# Patient Record
Sex: Female | Born: 1970 | Race: White | Hispanic: No | Marital: Married | Smoking: Never smoker
Health system: Southern US, Community
[De-identification: ages and names within clinical notes are randomized; demographics above are authoritative.]

## PROBLEM LIST (undated history)

## (undated) DIAGNOSIS — G629 Polyneuropathy, unspecified: Secondary | ICD-10-CM

## (undated) DIAGNOSIS — K219 Gastro-esophageal reflux disease without esophagitis: Secondary | ICD-10-CM

## (undated) DIAGNOSIS — R51 Headache: Secondary | ICD-10-CM

## (undated) DIAGNOSIS — I10 Essential (primary) hypertension: Secondary | ICD-10-CM

## (undated) DIAGNOSIS — D649 Anemia, unspecified: Secondary | ICD-10-CM

## (undated) DIAGNOSIS — M1990 Unspecified osteoarthritis, unspecified site: Secondary | ICD-10-CM

## (undated) DIAGNOSIS — R7989 Other specified abnormal findings of blood chemistry: Secondary | ICD-10-CM

## (undated) DIAGNOSIS — F32A Depression, unspecified: Secondary | ICD-10-CM

## (undated) DIAGNOSIS — R519 Headache, unspecified: Secondary | ICD-10-CM

## (undated) DIAGNOSIS — E119 Type 2 diabetes mellitus without complications: Secondary | ICD-10-CM

## (undated) DIAGNOSIS — F329 Major depressive disorder, single episode, unspecified: Secondary | ICD-10-CM

## (undated) DIAGNOSIS — Z9289 Personal history of other medical treatment: Secondary | ICD-10-CM

## (undated) HISTORY — PX: BACK SURGERY: SHX140

## (undated) HISTORY — DX: Essential (primary) hypertension: I10

## (undated) HISTORY — PX: ABDOMINAL HYSTERECTOMY: SHX81

---

## 2006-01-09 ENCOUNTER — Emergency Department: Payer: Self-pay | Admitting: Emergency Medicine

## 2006-11-29 ENCOUNTER — Ambulatory Visit: Payer: Self-pay | Admitting: Obstetrics and Gynecology

## 2008-03-14 ENCOUNTER — Emergency Department: Payer: Self-pay | Admitting: Emergency Medicine

## 2008-04-05 ENCOUNTER — Emergency Department: Payer: Self-pay | Admitting: Emergency Medicine

## 2010-12-05 ENCOUNTER — Ambulatory Visit: Payer: Self-pay | Admitting: Family Medicine

## 2010-12-07 ENCOUNTER — Ambulatory Visit: Payer: Self-pay | Admitting: Family Medicine

## 2014-10-04 ENCOUNTER — Encounter: Payer: Self-pay | Admitting: Emergency Medicine

## 2014-10-04 ENCOUNTER — Emergency Department
Admission: EM | Admit: 2014-10-04 | Discharge: 2014-10-04 | Disposition: A | Discharge status: Home / Self Care | Payer: BLUE CROSS/BLUE SHIELD | Attending: Emergency Medicine | Admitting: Emergency Medicine

## 2014-10-04 ENCOUNTER — Emergency Department: Payer: BLUE CROSS/BLUE SHIELD

## 2014-10-04 ENCOUNTER — Other Ambulatory Visit: Payer: Self-pay

## 2014-10-04 DIAGNOSIS — Z79899 Other long term (current) drug therapy: Secondary | ICD-10-CM | POA: Insufficient documentation

## 2014-10-04 DIAGNOSIS — R1110 Vomiting, unspecified: Secondary | ICD-10-CM

## 2014-10-04 DIAGNOSIS — R42 Dizziness and giddiness: Secondary | ICD-10-CM

## 2014-10-04 DIAGNOSIS — H81399 Other peripheral vertigo, unspecified ear: Secondary | ICD-10-CM | POA: Insufficient documentation

## 2014-10-04 HISTORY — DX: Depression, unspecified: F32.A

## 2014-10-04 HISTORY — DX: Major depressive disorder, single episode, unspecified: F32.9

## 2014-10-04 LAB — BASIC METABOLIC PANEL
Anion gap: 11 (ref 5–15)
BUN: 15 mg/dL (ref 6–20)
CO2: 20 mmol/L — AB (ref 22–32)
CREATININE: 1.24 mg/dL — AB (ref 0.44–1.00)
Calcium: 9.5 mg/dL (ref 8.9–10.3)
Chloride: 108 mmol/L (ref 101–111)
GFR calc Af Amer: >60 mL/min (ref >60)
GFR, EST NON AFRICAN AMERICAN: 53 mL/min — AB (ref >60)
Glucose, Bld: 179 mg/dL — ABNORMAL HIGH (ref 65–99)
POTASSIUM: 4.3 mmol/L (ref 3.5–5.1)
Sodium: 139 mmol/L (ref 135–145)

## 2014-10-04 LAB — CBC
HCT: 41.5 % (ref 35.0–47.0)
Hemoglobin: 13.8 g/dL (ref 12.0–16.0)
MCH: 28.3 pg (ref 26.0–34.0)
MCHC: 33.3 g/dL (ref 32.0–36.0)
MCV: 84.7 fL (ref 80.0–100.0)
PLATELETS: 258 10*3/uL (ref 150–440)
RBC: 4.9 MIL/uL (ref 3.80–5.20)
RDW: 13.3 % (ref 11.5–14.5)
WBC: 14.5 10*3/uL — ABNORMAL HIGH (ref 3.6–11.0)

## 2014-10-04 MED ORDER — MECLIZINE HCL 25 MG PO TABS
ORAL_TABLET | ORAL | Status: AC
  Administered 2014-10-04: 25 mg via ORAL
  Filled 2014-10-04: qty 1

## 2014-10-04 MED ORDER — MECLIZINE HCL 12.5 MG PO TABS: 12.5000 mg | ORAL_TABLET | Freq: Three times a day (TID) | ORAL | Status: AC | PRN

## 2014-10-04 MED ORDER — DIPHENHYDRAMINE HCL 50 MG/ML IJ SOLN
12.5000 mg | Freq: Once | INTRAMUSCULAR | Status: AC
  Administered 2014-10-04: 12.5 mg via INTRAMUSCULAR

## 2014-10-04 MED ORDER — MECLIZINE HCL 25 MG PO TABS
25.0000 mg | ORAL_TABLET | Freq: Once | ORAL | Status: AC
  Administered 2014-10-04: 25 mg via ORAL

## 2014-10-04 MED ORDER — DIPHENHYDRAMINE HCL 50 MG/ML IJ SOLN
INTRAMUSCULAR | Status: AC
  Administered 2014-10-04: 12.5 mg via INTRAMUSCULAR
  Filled 2014-10-04: qty 1

## 2014-10-04 NOTE — ED Notes (Signed)
Patient presents to the ED with severe dizziness, nausea, vomiting, hot flashes, and light headedness since this morning.  Patient states symptoms are worse when she stands up.  Patient states she hit her head twice during the night on Tuesday.  Patient has two small healing lacerations to her forehead.  Patient is in no obvious distress at this time.  Denies pain.

## 2014-10-04 NOTE — ED Notes (Signed)
Pt sat up in bed with MD at bedside. Pt reports becoming dizzy and lightheaded with sitting up straight .

## 2014-10-04 NOTE — ED Provider Notes (Signed)
[facility] Emergency Department Provider Note  ____________________________________________  Time seen: 1545  I have reviewed the triage vital signs and the nursing notes.   HISTORY  Chief Complaint Dizziness  nausea   HPI Sherry Hobbs is a 44 y.o. female who woke up this morning with significant vertigo. She feels that the room is spinning. This is worsened whenever she moves or sits up or stands. She has not had symptoms like this before. She has ongoing nausea. She does report that she fell and hit her head on Tuesday. She has 2 small skin lacerations, one on her forehead and 1 further up. She denies loss of consciousness at that time. She reports it occurred because she was sleepwalking, which is something she doesn't occasion. She denies any weakness in her hands or legs. She feels like she has full strength and is able to walk except for feeling off balance.   Past Medical History  Diagnosis Date  . Depression     There are no active problems to display for this patient.   History reviewed. No pertinent past surgical history.  Current Outpatient Rx  Name  Route  Sig  Dispense  Refill  . citalopram (CELEXA) 40 MG tablet   Oral   Take 40 mg by mouth daily.         . meclizine (ANTIVERT) 12.5 MG tablet   Oral   Take 1 tablet (12.5 mg total) by mouth 3 (three) times daily as needed for dizziness or nausea.   30 tablet   1     Allergies Codeine  History reviewed. No pertinent family history.  Social History History  Substance Use Topics  . Smoking status: Never Smoker   . Smokeless tobacco: Not on file  . Alcohol Use: No    Review of Systems  Constitutional: Negative for fever. ENT: Negative for sore throat. Cardiovascular: Negative for chest pain. Respiratory: Negative for shortness of breath. Gastrointestinal: Negative for abdominal pain, vomiting and diarrhea. Genitourinary: Negative for dysuria. Musculoskeletal:  Negative for back pain. Skin: Negative for rash. Neurological: Notable for vertigo. See history of present illness.   10-point ROS otherwise negative.  ____________________________________________   PHYSICAL EXAM:  VITAL SIGNS: ED Triage Vitals  Enc Vitals Group     BP 10/04/14 1405 131/71 mmHg     Pulse Rate 10/04/14 1405 62     Resp 10/04/14 1405 16     Temp 10/04/14 1405 97.6 F (36.4 C)     Temp Source 10/04/14 1405 Oral     SpO2 10/04/14 1405 100 %     Weight 10/04/14 1405 183 lb (83.008 kg)     Height 10/04/14 1405 5\' 2"  (1.575 m)     Head Cir --      Peak Flow --      Pain Score 10/04/14 1406 0     Pain Loc --      Pain Edu? --      Excl. in [place]? --     Constitutional:  Alert and oriented. Well appearing and in no distress, but clearly limiting the amount of head movement ENT   Head: Normocephalic. There are 2 small healing scabbed over wounds from her fall. No erythema.   Nose: No congestion/rhinnorhea.   Mouth/Throat: Mucous membranes are moist.      Ears: Normal canals, normal TMs with no erythema. Cardiovascular: Normal rate, regular rhythm. Respiratory: Normal respiratory effort without tachypnea. Breath sounds are clear and equal bilaterally. No wheezes/rales/rhonchi. Gastrointestinal:  Soft and nontender. No distention.  Back: No muscle spasm, no tenderness, no CVA tenderness. Musculoskeletal: Nontender with normal range of motion in all extremities.  No noted edema. Neurologic:  Alert and communicative. She has full strength in all 4 extremities. Grips are equal. Sensation is intact. She has a negative pronator drift. She does not sway significantly while performing the pronator drift maneuver sitting up in bed. She has good finger to nose motion.  Skin:  Skin is warm, dry. No rash noted. Psychiatric: Mood and affect are normal. Speech and behavior are normal.  ____________________________________________    LABS (pertinent  positives/negatives)  White blood cell count is 71.2, metabolic panel is overall normal with potassium of 4.3.  ____________________________________________   EKG  ED ECG REPORT I, Ronni Osterberg W, the attending physician, personally viewed and interpreted this ECG.   Date: 10/04/2014  EKG Time: 1421  Rate: 57  Rhythm: Sinus bradycardia  Axis: 35 and normal.  Intervals:Normal intervals  ST&T Change: No ST changes   ____________________________________________    RADIOLOGY  CT head is normal with no acute changes.  ____________________________________________  INITIAL IMPRESSION / ASSESSMENT AND PLAN / ED COURSE  Patient with peripheral vertigo. We will treat her with meclizine by mouth. Because of the patient's nausea and her concerns for tolerating by mouth we are going to give her a small dose of diphenhydramine IM as well. She reports that she is comfortable with discharge home at this time to she can rest. We will prescribe meclizine for her.  ____________________________________________   FINAL CLINICAL IMPRESSION(S) / ED DIAGNOSES  Final diagnoses:  Peripheral vertigo, unspecified laterality  Acute onset of vertigo with vomiting and inability to stand      Ahmed Prima, MD 10/04/14 [phone]

## 2016-03-30 ENCOUNTER — Encounter: Payer: Self-pay | Admitting: Emergency Medicine

## 2016-03-30 ENCOUNTER — Observation Stay
Admission: EM | Admit: 2016-03-30 | Discharge: 2016-03-31 | Disposition: A | Discharge status: Home / Self Care | Payer: BLUE CROSS/BLUE SHIELD | Attending: Obstetrics and Gynecology | Admitting: Obstetrics and Gynecology

## 2016-03-30 ENCOUNTER — Observation Stay: Payer: BLUE CROSS/BLUE SHIELD

## 2016-03-30 DIAGNOSIS — Z823 Family history of stroke: Secondary | ICD-10-CM | POA: Diagnosis not present

## 2016-03-30 DIAGNOSIS — D649 Anemia, unspecified: Secondary | ICD-10-CM

## 2016-03-30 DIAGNOSIS — Z808 Family history of malignant neoplasm of other organs or systems: Secondary | ICD-10-CM | POA: Diagnosis not present

## 2016-03-30 DIAGNOSIS — N938 Other specified abnormal uterine and vaginal bleeding: Principal | ICD-10-CM | POA: Insufficient documentation

## 2016-03-30 DIAGNOSIS — Z825 Family history of asthma and other chronic lower respiratory diseases: Secondary | ICD-10-CM | POA: Insufficient documentation

## 2016-03-30 DIAGNOSIS — N83202 Unspecified ovarian cyst, left side: Secondary | ICD-10-CM | POA: Diagnosis not present

## 2016-03-30 DIAGNOSIS — Z8051 Family history of malignant neoplasm of kidney: Secondary | ICD-10-CM | POA: Diagnosis not present

## 2016-03-30 DIAGNOSIS — N83209 Unspecified ovarian cyst, unspecified side: Secondary | ICD-10-CM | POA: Diagnosis present

## 2016-03-30 DIAGNOSIS — N939 Abnormal uterine and vaginal bleeding, unspecified: Secondary | ICD-10-CM

## 2016-03-30 DIAGNOSIS — Z8249 Family history of ischemic heart disease and other diseases of the circulatory system: Secondary | ICD-10-CM | POA: Insufficient documentation

## 2016-03-30 DIAGNOSIS — Z885 Allergy status to narcotic agent status: Secondary | ICD-10-CM | POA: Insufficient documentation

## 2016-03-30 DIAGNOSIS — Z7984 Long term (current) use of oral hypoglycemic drugs: Secondary | ICD-10-CM | POA: Insufficient documentation

## 2016-03-30 DIAGNOSIS — Z8262 Family history of osteoporosis: Secondary | ICD-10-CM | POA: Insufficient documentation

## 2016-03-30 DIAGNOSIS — F329 Major depressive disorder, single episode, unspecified: Secondary | ICD-10-CM | POA: Diagnosis not present

## 2016-03-30 DIAGNOSIS — Z833 Family history of diabetes mellitus: Secondary | ICD-10-CM | POA: Insufficient documentation

## 2016-03-30 LAB — URINALYSIS COMPLETE WITH MICROSCOPIC (ARMC ONLY)
Bilirubin Urine: NEGATIVE
Glucose, UA: NEGATIVE mg/dL
Ketones, ur: NEGATIVE mg/dL
Leukocytes, UA: NEGATIVE
Nitrite: NEGATIVE
PH: 5 (ref 5.0–8.0)
PROTEIN: 100 mg/dL — AB
Specific Gravity, Urine: 1.014 (ref 1.005–1.030)

## 2016-03-30 LAB — RAPID HIV SCREEN (HIV 1/2 AB+AG)
HIV 1/2 Antibodies: NONREACTIVE
HIV-1 P24 Antigen - HIV24: NONREACTIVE

## 2016-03-30 LAB — ABO/RH: ABO/RH(D): O POS

## 2016-03-30 LAB — CBC
HCT: 22.8 % — ABNORMAL LOW (ref 35.0–47.0)
Hemoglobin: 7.4 g/dL — ABNORMAL LOW (ref 12.0–16.0)
MCH: 27.3 pg (ref 26.0–34.0)
MCHC: 32.4 g/dL (ref 32.0–36.0)
MCV: 84.4 fL (ref 80.0–100.0)
PLATELETS: 332 10*3/uL (ref 150–440)
RBC: 2.7 MIL/uL — ABNORMAL LOW (ref 3.80–5.20)
RDW: 13.6 % (ref 11.5–14.5)
WBC: 14.9 10*3/uL — ABNORMAL HIGH (ref 3.6–11.0)

## 2016-03-30 LAB — BASIC METABOLIC PANEL
ANION GAP: 6 (ref 5–15)
BUN: 11 mg/dL (ref 6–20)
CALCIUM: 8.7 mg/dL — AB (ref 8.9–10.3)
CO2: 25 mmol/L (ref 22–32)
Chloride: 104 mmol/L (ref 101–111)
Creatinine, Ser: 1.18 mg/dL — ABNORMAL HIGH (ref 0.44–1.00)
GFR calc Af Amer: >60 mL/min (ref >60)
GFR calc non Af Amer: 56 mL/min — ABNORMAL LOW (ref >60)
GLUCOSE: 125 mg/dL — AB (ref 65–99)
Potassium: 4 mmol/L (ref 3.5–5.1)
Sodium: 135 mmol/L (ref 135–145)

## 2016-03-30 LAB — PREPARE RBC (CROSSMATCH)

## 2016-03-30 LAB — TROPONIN I: Troponin I: <0.03 ng/mL (ref <0.03)

## 2016-03-30 LAB — POCT PREGNANCY, URINE: PREG TEST UR: NEGATIVE

## 2016-03-30 LAB — TSH: TSH: 3.717 u[IU]/mL (ref 0.350–4.500)

## 2016-03-30 MED ORDER — MAGNESIUM HYDROXIDE 400 MG/5ML PO SUSP: 30.0000 mL | Freq: Every day | ORAL | Status: DC | PRN

## 2016-03-30 MED ORDER — ESTROGENS CONJUGATED 25 MG IJ SOLR
25.0000 mg | Freq: Once | INTRAMUSCULAR | Status: AC
  Administered 2016-03-31: 25 mg via INTRAVENOUS
  Filled 2016-03-30: qty 25

## 2016-03-30 MED ORDER — SODIUM CHLORIDE 0.9 % IV SOLN
Freq: Once | INTRAVENOUS | Status: AC
  Administered 2016-03-30: via INTRAVENOUS

## 2016-03-30 MED ORDER — ACETAMINOPHEN 500 MG PO TABS
1000.0000 mg | ORAL_TABLET | Freq: Once | ORAL | Status: AC
  Administered 2016-03-31: 1000 mg via ORAL
  Filled 2016-03-30: qty 2

## 2016-03-30 MED ORDER — DIPHENHYDRAMINE HCL 25 MG PO CAPS
25.0000 mg | ORAL_CAPSULE | Freq: Once | ORAL | Status: AC
  Administered 2016-03-31: 25 mg via ORAL
  Filled 2016-03-30: qty 1

## 2016-03-30 MED ORDER — IBUPROFEN 600 MG PO TABS: 600.0000 mg | ORAL_TABLET | Freq: Four times a day (QID) | ORAL | Status: DC | PRN

## 2016-03-30 MED ORDER — ONDANSETRON HCL 4 MG PO TABS
4.0000 mg | ORAL_TABLET | Freq: Four times a day (QID) | ORAL | Status: DC | PRN
  Administered 2016-03-31: 4 mg via ORAL
  Filled 2016-03-30: qty 1

## 2016-03-30 MED ORDER — ZOLPIDEM TARTRATE 5 MG PO TABS: 5.0000 mg | ORAL_TABLET | Freq: Every evening | ORAL | Status: DC | PRN

## 2016-03-30 MED ORDER — ALUM & MAG HYDROXIDE-SIMETH 200-200-20 MG/5ML PO SUSP: 30.0000 mL | ORAL | Status: DC | PRN

## 2016-03-30 MED ORDER — LACTATED RINGERS IV SOLN: INTRAVENOUS | Status: DC

## 2016-03-30 MED ORDER — FUROSEMIDE 10 MG/ML IJ SOLN
20.0000 mg | Freq: Once | INTRAMUSCULAR | Status: AC
  Administered 2016-03-31: 20 mg via INTRAVENOUS
  Filled 2016-03-30 (×2): qty 2

## 2016-03-30 MED ORDER — MAGNESIUM CITRATE PO SOLN
1.0000 | Freq: Once | ORAL | Status: DC | PRN
  Filled 2016-03-30: qty 296

## 2016-03-30 MED ORDER — DOCUSATE SODIUM 100 MG PO CAPS
100.0000 mg | ORAL_CAPSULE | Freq: Two times a day (BID) | ORAL | Status: DC
  Administered 2016-03-31: 100 mg via ORAL
  Filled 2016-03-30: qty 1

## 2016-03-30 MED ORDER — CITALOPRAM HYDROBROMIDE 20 MG PO TABS
40.0000 mg | ORAL_TABLET | Freq: Every day | ORAL | Status: DC
  Administered 2016-03-31: 40 mg via ORAL
  Filled 2016-03-30: qty 2

## 2016-03-30 MED ORDER — SODIUM CHLORIDE 0.9 % IV SOLN: 10.0000 mL/h | Freq: Once | INTRAVENOUS | Status: DC

## 2016-03-30 MED ORDER — ESTROGENS CONJUGATED 25 MG IJ SOLR
25.0000 mg | Freq: Four times a day (QID) | INTRAMUSCULAR | Status: DC
  Administered 2016-03-31 (×2): 25 mg via INTRAVENOUS
  Filled 2016-03-30 (×4): qty 25

## 2016-03-30 MED ORDER — BISACODYL 5 MG PO TBEC: 5.0000 mg | DELAYED_RELEASE_TABLET | Freq: Every day | ORAL | Status: DC | PRN

## 2016-03-30 MED ORDER — ONDANSETRON HCL 4 MG/2ML IJ SOLN: 4.0000 mg | Freq: Four times a day (QID) | INTRAMUSCULAR | Status: DC | PRN

## 2016-03-30 NOTE — ED Triage Notes (Signed)
Pt with shortness of breath started yesterday, denies any chest pain. Pt states she is currently on a heavy period.

## 2016-03-30 NOTE — ED Provider Notes (Signed)
[facility] Emergency Department Provider Note  ____________________________________________  Time seen: Approximately 8:09 PM  I have reviewed the triage vital signs and the nursing notes.   HISTORY  Chief Complaint Shortness of Breath   HPI Sherry Hobbs is a 45 y.o. female who presents for evaluation of dyspnea on exertion, palpitations, and dizziness. Patient reports that she had been on Depo for 16 years. She came off of it in July and had heavy vaginal bleeding from July to October. She saw her primary care doctor who put her on OCPs. She finished a 10 day course on Monday. Her bleeding had stopped however it started again on Monday. She reports that she's been bleeding very heavily and passing multiple blood clots since then. Today she started having dyspnea on exertion and felt like she was going to pass out she was going up the stairs. Also endorses palpitations. She denies chest pain or shortness of breath. She has never received a blood transfusion before. She denies abdominal pain.  Past Medical History:  Diagnosis Date  . Depression     There are no active problems to display for this patient.   History reviewed. No pertinent surgical history.  Prior to Admission medications   Medication Sig Start Date End Date Taking? Authorizing Provider  citalopram (CELEXA) 40 MG tablet Take 40 mg by mouth daily.    Historical Provider, MD  fluticasone (FLONASE) 50 MCG/ACT nasal spray Place 1 spray into both nostrils daily. 08/24/14   Historical Provider, MD  medroxyPROGESTERone (DEPO-PROVERA) 150 MG/ML injection Inject 150 mg into the muscle every 3 (three) months. 08/24/14   Historical Provider, MD    Allergies Codeine  No family history on file.  Social History Social History  Substance Use Topics  . Smoking status: Never Smoker  . Smokeless tobacco: Never Used  . Alcohol use No    Review of Systems  Constitutional: Negative for fever. +  lightheadedness Eyes: Negative for visual changes. ENT: Negative for sore throat. Cardiovascular: Negative for chest pain. + palpitations Respiratory: + DOE. Gastrointestinal: Negative for abdominal pain, vomiting or diarrhea. Genitourinary: Negative for dysuria. + vaginal bleeding Musculoskeletal: Negative for back pain. Skin: Negative for rash. Neurological: Negative for headaches, weakness or numbness.  ____________________________________________   PHYSICAL EXAM:  VITAL SIGNS: ED Triage Vitals  Enc Vitals Group     BP 03/30/16 1820 (!) 143/76     Pulse Rate 03/30/16 1820 100     Resp 03/30/16 1820 18     Temp 03/30/16 1820 98.5 F (36.9 C)     Temp Source 03/30/16 1820 Oral     SpO2 03/30/16 1820 98 %     Weight 03/30/16 1821 194 lb (88 kg)     Height 03/30/16 1821 5\' 2"  (1.575 m)     Head Circumference --      Peak Flow --      Pain Score 03/30/16 1841 2     Pain Loc --      Pain Edu? --      Excl. in [place]? --     Constitutional: Alert and oriented, pale. Well appearing and in no apparent distress. HEENT:      Head: Normocephalic and atraumatic.         Eyes: Conjunctivae are normal. Sclera is non-icteric. EOMI. PERRL      Mouth/Throat: Mucous membranes are moist.       Neck: Supple with no signs of meningismus. Cardiovascular: Tachycardic with regular rhythm. No murmurs,  gallops, or rubs. 2+ symmetrical distal pulses are present in all extremities. No JVD. Respiratory: Normal respiratory effort. Lungs are clear to auscultation bilaterally. No wheezes, crackles, or rhonchi.  Gastrointestinal: Soft, non tender, and non distended with positive bowel sounds. No rebound or guarding. Musculoskeletal: Nontender with normal range of motion in all extremities. No edema, cyanosis, or erythema of extremities. Neurologic: Normal speech and language. Face is symmetric. Moving all extremities. No gross focal neurologic deficits are appreciated. Skin: Skin is warm, dry and intact.  No rash noted. Psychiatric: Mood and affect are normal. Speech and behavior are normal.  ____________________________________________   LABS (all labs ordered are listed, but only abnormal results are displayed)  Labs Reviewed  CBC - Abnormal; Notable for the following:       Result Value   WBC 14.9 (*)    RBC 2.70 (*)    Hemoglobin 7.4 (*)    HCT 22.8 (*)    All other components within normal limits  BASIC METABOLIC PANEL - Abnormal; Notable for the following:    Glucose, Bld 125 (*)    Creatinine, Ser 1.18 (*)    Calcium 8.7 (*)    GFR calc non Af Amer 56 (*)    All other components within normal limits  URINALYSIS COMPLETEWITH MICROSCOPIC ([facility] ONLY) - Abnormal; Notable for the following:    Color, Urine RED (*)    APPearance HAZY (*)    Hgb urine dipstick 3+ (*)    Protein, ur 100 (*)    Bacteria, UA FEW (*)    Squamous Epithelial / LPF 0-5 (*)    All other components within normal limits  TROPONIN I  POCT PREGNANCY, URINE  TYPE AND SCREEN  PREPARE RBC (CROSSMATCH)   ____________________________________________  EKG  ED ECG REPORT I, Rudene Re, the attending physician, personally viewed and interpreted this ECG.  Normal sinus rhythm, rate of 99, normal intervals, normal axis, no ST elevations or depressions. ____________________________________________  RADIOLOGY  none  ____________________________________________   PROCEDURES  Procedure(s) performed: None Procedures Critical Care performed:  None ____________________________________________   INITIAL IMPRESSION / ASSESSMENT AND PLAN / ED COURSE   45 y.o. female who presents for evaluation of dyspnea on exertion, palpitations, and dizziness in the setting of heavy prolonged vaginal bleeding. Patient's heart rate is 100 with normal blood pressure. Her physical exam shows a pale patient in no apparent distress with no other acute findings. Her hemoglobin is 7.4. Remaining of her labs and EKG are  within normal limits. I spoke with Dr. Marcelline Mates, ObGYN who accepted admission to the hospital.  Clinical Course     Pertinent labs & imaging results that were available during my care of the patient were reviewed by me and considered in my medical decision making (see chart for details).    ____________________________________________   FINAL CLINICAL IMPRESSION(S) / ED DIAGNOSES  Final diagnoses:  Vaginal bleeding  Anemia, unspecified type      NEW MEDICATIONS STARTED DURING THIS VISIT:  New Prescriptions   No medications on file     Note:  This document was prepared using Dragon voice recognition software and may include unintentional dictation errors.    Rudene Re, MD 03/30/16 2017

## 2016-03-30 NOTE — H&P (Signed)
Reason for Consult: Symptomatic anemia Referring Physician: Rudene Re, MD (ER Physician) birth  Sherry Hobbs is an 45 y.o. G1P1 female with last menstrual period 03/20/2016,  who was admitted from the Emergency Room for symptomatic anemia. Patient presented with complaints of dyspnea on exertion, palpitations, and dizziness. Patient notes that she had previously been on Depo-Provera injections for approximately 16 years. She discontinued the Depo-Provera in July and had an episode of heavy vaginal bleeding from July to October. Patient was placed on Provera for 10 days. Notes that the bleeding had stopped however it restarted again past Monday. Patient has been bleeding heavily and passing multiple blood clots. Patient currently denies chest pain shortness of breath.This first episode of heavy vaginal bleeding that she is experienced.   Pertinent Gynecological History: Menarche age: 89 Menses: Patient has not had a period in 16 years due to Depo-Provera injections, has been bleeding since July after coming off of the Depo-Provera. Bleeding: dysfunctional uterine bleeding Contraception: none.  Notes that she and husband were thinking about conceiving again. DES exposure: denies Blood transfusions: none Sexually transmitted diseases: no past history Previous GYN Procedures: None  Last mammogram: normal Date: 2 years ago Last pap: normal Date: ~ 3 weeks ago OB History: SVD x 1    Past Medical History:  Diagnosis Date  . Depression     History reviewed. No pertinent surgical history.  Family History  Problem Relation Age of Onset  . Diabetes Mother   . COPD Mother   . Emphysema Mother   . Osteoporosis Mother   . CAD Father   . Diabetes Sister   . Diabetes Brother   . CAD Brother   . Renal cancer Maternal Uncle     1 uncle  . Brain cancer Maternal Uncle     different uncle  . Diabetes Maternal Grandmother   . Stroke Maternal Grandmother   . Bladder Cancer Maternal  Grandfather      Social History   Social History  . Marital status: Married    Spouse name: N/A  . Number of children: N/A  . Years of education: N/A   Occupational History  . Not on file.   Social History Main Topics  . Smoking status: Never Smoker  . Smokeless tobacco: Never Used  . Alcohol use No  . Drug use: Unknown  . Sexual activity: Not on file   Other Topics Concern  . Not on file   Social History Narrative  . No narrative on file    Allergies:  Allergies  Allergen Reactions  . Codeine Nausea And Vomiting    Medications:  Prior to Admission:  Prescriptions Prior to Admission  Medication Sig Dispense Refill Last Dose  . citalopram (CELEXA) 40 MG tablet Take 40 mg by mouth daily.   03/30/2016 at 0900  . ferrous sulfate 325 (65 FE) MG tablet Take 650 mg by mouth daily with breakfast.   03/30/2016 at 0900  . loratadine (CLARITIN) 10 MG tablet Take 10 mg by mouth daily.   03/30/2016 at 0900  . metFORMIN (GLUCOPHAGE) 500 MG tablet Take 2 tablets by mouth daily.  10 03/29/2016 at 2130    Review of Systems  Constitutional: Negative for chills and fever.  HENT: Negative for nosebleeds, sore throat and tinnitus.   Eyes: Negative for blurred vision, double vision and discharge.  Respiratory: Positive for shortness of breath. Negative for cough and wheezing.   Cardiovascular: Positive for palpitations. Negative for chest pain and leg swelling.  Gastrointestinal:  Negative for abdominal pain, heartburn, nausea and vomiting.  Genitourinary: Negative for dysuria, frequency, hematuria and urgency.  Musculoskeletal: Negative for back pain, joint pain, myalgias and neck pain.  Skin: Negative for itching and rash.  Neurological: Positive for dizziness. Negative for speech change, loss of consciousness, weakness and headaches.  Endo/Heme/Allergies: Positive for environmental allergies. Negative for polydipsia. Does not bruise/bleed easily.  Psychiatric/Behavioral: Negative  for depression, substance abuse and suicidal ideas.    Blood pressure (!) 143/76, pulse 100, temperature 98.5 F (36.9 C), temperature source Oral, resp. rate 18, height '5\' 2"'  (1.575 m), weight 194 lb (88 kg), last menstrual period 03/20/2016, SpO2 98 %. Physical Exam  Constitutional: She is oriented to person, place, and time. She appears well-developed and well-nourished.  HENT:  Head: Normocephalic and atraumatic.  Right Ear: External ear normal.  Left Ear: External ear normal.  Mouth/Throat: Oropharynx is clear and moist.  Eyes: Conjunctivae and EOM are normal. Pupils are equal, round, and reactive to light. No scleral icterus.  Neck: Normal range of motion. Neck supple. No JVD present. No tracheal deviation present. No thyromegaly present.  Cardiovascular: Regular rhythm and normal heart sounds.  Exam reveals no friction rub.   No murmur heard. Borderline tachycardia  GI: Soft. Bowel sounds are normal. She exhibits no distension and no mass. There is no tenderness. There is no rebound.  Genitourinary: Vagina normal and uterus normal. Rectal exam shows guaiac negative stool.  Genitourinary Comments: Dark red blood in vaginal vault  Musculoskeletal: Normal range of motion. She exhibits no edema or deformity.  Lymphadenopathy:    She has no cervical adenopathy.  Neurological: She is alert and oriented to person, place, and time.  Skin: Skin is warm and dry. She is not diaphoretic.  Psychiatric: She has a normal mood and affect. Her behavior is normal. Thought content normal.    Results for orders placed or performed during the hospital encounter of 03/30/16 (from the past 48 hour(s))  Pregnancy, urine POC     Status: None   Collection Time: 03/30/16  7:25 PM  Result Value Ref Range   Preg Test, Ur NEGATIVE NEGATIVE    Comment:        THE SENSITIVITY OF THIS METHODOLOGY IS >24 mIU/mL   CBC     Status: Abnormal   Collection Time: 03/30/16  7:26 PM  Result Value Ref Range   WBC  14.9 (H) 3.6 - 11.0 K/uL   RBC 2.70 (L) 3.80 - 5.20 MIL/uL   Hemoglobin 7.4 (L) 12.0 - 16.0 g/dL   HCT 22.8 (L) 35.0 - 47.0 %   MCV 84.4 80.0 - 100.0 fL   MCH 27.3 26.0 - 34.0 pg   MCHC 32.4 32.0 - 36.0 g/dL   RDW 13.6 11.5 - 14.5 %   Platelets 332 150 - 440 K/uL  Basic metabolic panel     Status: Abnormal   Collection Time: 03/30/16  7:26 PM  Result Value Ref Range   Sodium 135 135 - 145 mmol/L   Potassium 4.0 3.5 - 5.1 mmol/L   Chloride 104 101 - 111 mmol/L   CO2 25 22 - 32 mmol/L   Glucose, Bld 125 (H) 65 - 99 mg/dL   BUN 11 6 - 20 mg/dL   Creatinine, Ser 1.18 (H) 0.44 - 1.00 mg/dL   Calcium 8.7 (L) 8.9 - 10.3 mg/dL   GFR calc non Af Amer 56 (L) >60 mL/min   GFR calc Af Amer >60 >60 mL/min  Comment: (NOTE) The eGFR has been calculated using the CKD EPI equation. This calculation has not been validated in all clinical situations. eGFR's persistently <60 mL/min signify possible Chronic Kidney Disease.    Anion gap 6 5 - 15  Urinalysis complete, with microscopic ([facility] only)     Status: Abnormal   Collection Time: 03/30/16  7:26 PM  Result Value Ref Range   Color, Urine RED (A) YELLOW   APPearance HAZY (A) CLEAR   Glucose, UA NEGATIVE NEGATIVE mg/dL   Bilirubin Urine NEGATIVE NEGATIVE   Ketones, ur NEGATIVE NEGATIVE mg/dL   Specific Gravity, Urine 1.014 1.005 - 1.030   Hgb urine dipstick 3+ (A) NEGATIVE   pH 5.0 5.0 - 8.0   Protein, ur 100 (A) NEGATIVE mg/dL   Nitrite NEGATIVE NEGATIVE   Leukocytes, UA NEGATIVE NEGATIVE   RBC / HPF TOO NUMEROUS TO COUNT 0 - 5 RBC/hpf   WBC, UA TOO NUMEROUS TO COUNT 0 - 5 WBC/hpf   Bacteria, UA FEW (A) NONE SEEN   Squamous Epithelial / LPF 0-5 (A) NONE SEEN  Troponin I     Status: None   Collection Time: 03/30/16  7:26 PM  Result Value Ref Range   Troponin I <0.03 <0.03 ng/mL  Type and screen     Status: None (Preliminary result)   Collection Time: 03/30/16  8:05 PM  Result Value Ref Range   ABO/RH(D) PENDING    Antibody Screen  PENDING    Sample Expiration 04/02/2016     No results found.  Assessment/Plan: 1. Symptomatic anemia - secondary to dysfunctional uterine bleeding.  Will admit patient for blood transfusion. Two units PRBCS ordered, will transfuse 1 unit and repeat CBC.  Can transfuse second unit if needed. 2. Dysfunctional uterine bleeding - patient with long-term Depo-Provera use, with recent discontinuation. Was placed on Provera tablets and recently completed therapy. Notes that bleeding stopped while on the tablet however resumed shortly after completion of therapy. Will place patient on high-dose estrogen therapy 25 mg IV 24 hours to help with bleeding.  Will order pelvic ultrasound to assess for any possible structural causes of patient's abnormal bleeding.  TSH an STD panel ordered.   [name], MD [facility] 03/30/2016

## 2016-03-31 ENCOUNTER — Encounter: Payer: Self-pay | Admitting: Obstetrics and Gynecology

## 2016-03-31 DIAGNOSIS — N83209 Unspecified ovarian cyst, unspecified side: Secondary | ICD-10-CM | POA: Diagnosis present

## 2016-03-31 DIAGNOSIS — N938 Other specified abnormal uterine and vaginal bleeding: Secondary | ICD-10-CM | POA: Diagnosis present

## 2016-03-31 LAB — HEMOGLOBIN AND HEMATOCRIT, BLOOD
HCT: 22.6 % — ABNORMAL LOW (ref 35.0–47.0)
HEMATOCRIT: 26.7 % — AB (ref 35.0–47.0)
HEMOGLOBIN: 8.8 g/dL — AB (ref 12.0–16.0)
Hemoglobin: 7.6 g/dL — ABNORMAL LOW (ref 12.0–16.0)

## 2016-03-31 MED ORDER — NORETHINDRONE ACETATE 5 MG PO TABS: 5.0000 mg | ORAL_TABLET | Freq: Every day | ORAL | 2 refills | Status: DC

## 2016-03-31 MED ORDER — METFORMIN HCL 500 MG PO TABS: 1000.0000 mg | ORAL_TABLET | Freq: Every day | ORAL | Status: DC

## 2016-03-31 MED ORDER — DOCUSATE SODIUM 100 MG PO CAPS: 100.0000 mg | ORAL_CAPSULE | Freq: Every day | ORAL | 2 refills | Status: DC | PRN

## 2016-03-31 MED ORDER — FERROUS SULFATE 325 (65 FE) MG PO TABS: 325.0000 mg | ORAL_TABLET | Freq: Three times a day (TID) | ORAL | 2 refills | Status: DC

## 2016-03-31 NOTE — Progress Notes (Signed)
MAR states to reconstitute Premarin with sterile water for injection. Per Pharmacist Matt, Premarin can be reconstituted with Normal Saline instead.  Jonne Ply, RN

## 2016-03-31 NOTE — Progress Notes (Signed)
RN called Dr Marcelline Mates after [facility] were back.  Dr Marcelline Mates made aware of results and gave orders to discharge patient.

## 2016-03-31 NOTE — H&P (Deleted)
  Subjective: Patient denies any dizziness or palpitations currently.  Has ambulated some to the restroom and denies any shortness of breath.  Patient notes that her periods are about the same in flow. Has only changed 1 pad this morning.  Objective: I have reviewed patient's vital signs, intake and output, medications, labs and radiology results. Vitals:   03/31/16 0825 03/31/16 1012  BP: 128/65 122/70  Pulse: 78 82  Resp: 20 20  Temp: 97.7 F (36.5 C) 97.6 F (36.4 C)    General: alert and no distress Resp: clear to auscultation bilaterally Cardio: regular rate and rhythm, S1, S2 normal, no murmur, click, rub or gallop GI: soft, non-tender; bowel sounds normal; no masses,  no organomegaly Extremities: extremities normal, atraumatic, no cyanosis or edema Vaginal Bleeding: moderate    Labs:  CBC Latest Ref Rng & Units 03/31/2016 03/30/2016 10/04/2014  WBC 3.6 - 11.0 K/uL - 14.9(H) 14.5(H)  Hemoglobin 12.0 - 16.0 g/dL 7.6(L) 7.4(L) 13.8  Hematocrit 35.0 - 47.0 % 22.6(L) 22.8(L) 41.5  Platelets 150 - 440 K/uL - 332 258    Results for orders placed or performed during the hospital encounter of 03/30/16  TSH  Result Value Ref Range   TSH 3.717 0.350 - 4.500 uIU/mL  Rapid HIV screen (HIV 1/2 Ab+Ag)  Result Value Ref Range   HIV-1 P24 Antigen - HIV24 NON REACTIVE NON REACTIVE   HIV 1/2 Antibodies NON REACTIVE NON REACTIVE   Interpretation (HIV Ag Ab)      A non reactive test result means that HIV 1 or HIV 2 antibodies and HIV 1 p24 antigen were not detected in the specimen.    Assessment/Plan: 1. Symptomatic anemia - patient has received transfusion of 1 unit of PRBCs, with no change in H/H.  We'll proceed with transfusion of second unit.  Initial labs or likely lagging behind patient's actual blood loss.  We'll need to continue iron therapy outpatient 2. Dysfunctional uterine bleeding - patient has received approximately 2 doses of IV estrogen. Will continue treatment every 6  hours.  We'll then transition patient to a dose of combined OCPs 1 month.  Patient was previously on Depo-Provera for approximately 16 years. She will need a DEXA scan outpatient to assess bone density.  3. On further discussion with patient regarding her previous management, it was discovered the patient had actually [facility] ago and was placed on the Provera tablets. Notified [facility] of patient's admission, I will continue to manage patient while inpatient however patient will follow up as outpatient in their clinic.   Patient notes that she and husband would like to try to conceive, would like to discuss her options as she is not currently interested in surgical management. Patient can discuss further at time of her outpatient visit.  Dispo: Patient will be able to discharge home later today once blood transfusions completed, and showing appropriate upward trend, and is no longer symptomatic from her anemia.   LOS: 0 days    [name] 03/31/2016, 10:28 AM

## 2016-03-31 NOTE — Progress Notes (Signed)
All discharge instructions given to patient and she voices understanding of all instructions given.  She will make her own f/u appt with Dr Marcelline Mates in 2 wks. Prescriptions sent to [facility]. Patient discharged home with spouse escorted out by RN.

## 2016-03-31 NOTE — Discharge Instructions (Signed)
Menorrhagia Menorrhagia is when your menstrual periods are heavy or last longer than usual. Follow these instructions at home:  Only take medicine as told by your doctor.  Take any iron pills as told by your doctor. Heavy bleeding may cause low levels of iron in your body.  Do not take aspirin 1 week before or during your period. Aspirin can make the bleeding worse.  Lie down for a while if you change your tampon or pad more than once in 2 hours. This may help lessen the bleeding.  Eat a healthy diet and foods with iron. These foods include leafy green vegetables, meat, liver, eggs, and whole grain breads and cereals.  Do not try to lose weight. Wait until the heavy bleeding has stopped and your iron level is normal. Contact a doctor if:  You soak through a pad or tampon every 1 or 2 hours, and this happens every time you have a period.  You need to use pads and tampons at the same time because you are bleeding so much.  You need to change your pad or tampon during the night.  You have a period that lasts for more than 8 days.  You pass clots bigger than 1 inch (2.5 cm) wide.  You have irregular periods that happen more or less often than once a month.  You feel dizzy or pass out (faint).  You feel very weak or tired.  You feel short of breath or feel your heart is beating too fast when you exercise.  You feel sick to your stomach (nausea) and you throw up (vomit) while you are taking your medicine.  You have watery poop (diarrhea) while you are taking your medicine.  You have any problems that may be related to the medicine you are taking. Get help right away if:  You soak through 4 or more pads or tampons in 2 hours.  You have any bleeding while you are pregnant. This information is not intended to replace advice given to you by your health care provider. Make sure you discuss any questions you have with your health care provider. Document Released: 02/08/2008 Document  Revised: 10/07/2015 Document Reviewed: 10/31/2012 Elsevier Interactive Patient Education  2017 [employer] Inc.  

## 2016-03-31 NOTE — Progress Notes (Addendum)
Subjective: Patient denies any dizziness or palpitations currently.  Has ambulated some to the restroom and denies any shortness of breath.  Patient notes that her periods are about the same in flow. Has only changed 1 pad this morning.  Objective: I have reviewed patient's vital signs, intake and output, medications, labs and radiology results. Vitals:   03/31/16 0825 03/31/16 1012  BP: 128/65 122/70  Pulse: 78 82  Resp: 20 20  Temp: 97.7 F (36.5 C) 97.6 F (36.4 C)    General: alert and no distress Resp: clear to auscultation bilaterally Cardio: regular rate and rhythm, S1, S2 normal, no murmur, click, rub or gallop GI: soft, non-tender; bowel sounds normal; no masses,  no organomegaly Extremities: extremities normal, atraumatic, no cyanosis or edema Vaginal Bleeding: moderate    Labs:  CBC Latest Ref Rng & Units 03/31/2016 03/30/2016 10/04/2014  WBC 3.6 - 11.0 K/uL - 14.9(H) 14.5(H)  Hemoglobin 12.0 - 16.0 g/dL 7.6(L) 7.4(L) 13.8  Hematocrit 35.0 - 47.0 % 22.6(L) 22.8(L) 41.5  Platelets 150 - 440 K/uL - 332 258    Results for orders placed or performed during the hospital encounter of 03/30/16  TSH  Result Value Ref Range   TSH 3.717 0.350 - 4.500 uIU/mL  Rapid HIV screen (HIV 1/2 Ab+Ag)  Result Value Ref Range   HIV-1 P24 Antigen - HIV24 NON REACTIVE NON REACTIVE   HIV 1/2 Antibodies NON REACTIVE NON REACTIVE   Interpretation (HIV Ag Ab)      A non reactive test result means that HIV 1 or HIV 2 antibodies and HIV 1 p24 antigen were not detected in the specimen.    Imaging (Pelvic Ultrasound 03/30/2016):  CLINICAL DATA:  Abnormal uterine bleeding for 6 months.  EXAM: TRANSABDOMINAL AND TRANSVAGINAL ULTRASOUND OF PELVIS  TECHNIQUE: Both transabdominal and transvaginal ultrasound examinations of the pelvis were performed. Transabdominal technique was performed for global imaging of the pelvis including uterus, ovaries, adnexal regions, and pelvic cul-de-sac. It  was necessary to proceed with endovaginal exam following the transabdominal exam to visualize the endometrium.  COMPARISON:  None  FINDINGS: Uterus  Measurements: 9.3 x 5.5 x 6.3 cm. No fibroids or other mass visualized.  Endometrium  Thickness: 17 mm. Thickened without discrete mass. Mildly hypervascular endometrium.  Right ovary  Measurements: 4 x 2.5 x 3.4 cm. Multiple dominant follicles measuring up to 2.2 cm.  Left ovary  Measurements: 5.7 x 2.6 x 3.4 cm. Anechoic 5.1 cm simple cyst.Otherwise normal in appearance.  Other findings  No abnormal free fluid.  IMPRESSION: Abnormally thickened endometrium. If bleeding remains unresponsive to hormonal or medical therapy, focal lesion work-up with sonohysterogram should be considered. Endometrial biopsy should also be considered in pre-menopausal patients at high risk for endometrial carcinoma. (Ref: Radiological Reasoning: Algorithmic Workup of Abnormal Vaginal Bleeding with Endovaginal Sonography and Sonohysterography. AJR 2008; [id number])  5.1 cm LEFT ovarian cyst. This is almost certainly benign, but follow up ultrasound is recommended in 1 year according to the Society of Radiologists in Ultrasound 2010 Consensus Conference Statement (D Clovis Riley et al. Management of Asymptomatic Ovarian and Other Adnexal Cysts Imaged at [place]: Society of Radiologists in [place] Statement 2010. Radiology 256 (Sept 2010): 943-954.).    Assessment/Plan: 1. Symptomatic anemia - patient has received transfusion of 1 unit of PRBCs, with no change in H/H.  We'll proceed with transfusion of second unit.  Initial labs or likely lagging behind patient's actual blood loss.  We'll need to continue iron therapy outpatient 2. Dysfunctional  uterine bleeding - patient has received approximately 2 doses of IV estrogen. Will continue treatment every 6 hours.  We'll then transition patient to a dose of combined OCPs 1 month.   Patient was previously on Depo-Provera for approximately 16 years. She will need a DEXA scan outpatient to assess bone density.  Ultrasound does not note any structural causes for her dysfunctional bleeding. Discuss ultrasound results with patient. 3. On further discussion with patient regarding her previous management, it was discovered the patient had actually [facility] weeks ago and was placed on the Provera tablets. Notified [facility] of patient's admission, I will continue to manage patient while inpatient however patient will follow up as outpatient in their clinic.   Patient notes that she and husband would like to try to conceive, would like to discuss her options as she is not currently interested in surgical management. Patient can discuss further at time of her outpatient visit. 4. Left adnexal cyst - currently asymptomatic. Is approximately 5.1 cm. Likely benign based on appearance on ultrasound. Will likely resolve spontaneously, or with OCP treatment. We'll need follow-up in 6-8 weeks to reassess ovarian cyst size.   Dispo: Patient will be able to discharge home later today once blood transfusions completed, and showing appropriate upward trend, and is no longer symptomatic from her anemia.   LOS: 0 days    Sherry Hobbs 03/31/2016, 10:38 AM

## 2016-04-01 LAB — TYPE AND SCREEN
ABO/RH(D): O POS
ANTIBODY SCREEN: NEGATIVE
UNIT DIVISION: 0
Unit division: 0

## 2016-04-01 LAB — HEPATITIS B SURFACE ANTIGEN: HEP B S AG: NEGATIVE

## 2016-04-01 LAB — HEPATITIS C ANTIBODY

## 2016-04-01 LAB — RPR: RPR Ser Ql: NONREACTIVE

## 2016-04-01 NOTE — Discharge Summary (Signed)
Physician Discharge Summary   Patient ID: Sherry Hobbs [id number] y.o. October 31, 1970  Admit date: 03/30/2016  Discharge date and time: 03/31/2016  5:15 PM   Admitting Physician: [name], MD   Discharge Physician: [name], MD  Admission Diagnoses: Vaginal bleeding [N93.9] Abnormal uterine bleeding [N93.9] Anemia, unspecified type [D64.9]  Discharge Diagnoses: Same  Admission Condition: fair  Discharged Condition: good  Indication for Admission: Symptomatic anemia, abnormal uterine bleeding  Hospital Course: The patient was admitted from the Emergency Room for complaints of dizziness, lightheadedness, dyspnea on exertion and abnormal vaginal bleeding. Her Hgb was noted to be 7.4. The patient was transfused 2 units of PRBCs, with an increase to 8.8.  She was also treated with IV conjugated estrogen 25 mg q 6 hours. Her symptoms resolved by the next day, and she was discharged home.   Consults: None  Significant Diagnostic Studies: labs and radiology: Ultrasound: see below   Results for orders placed or performed during the hospital encounter of 03/30/16  CBC  Result Value Ref Range   WBC 14.9 (H) 3.6 - 11.0 K/uL   RBC 2.70 (L) 3.80 - 5.20 MIL/uL   Hemoglobin 7.4 (L) 12.0 - 16.0 g/dL   HCT 22.8 (L) 35.0 - 47.0 %   MCV 84.4 80.0 - 100.0 fL   MCH 27.3 26.0 - 34.0 pg   MCHC 32.4 32.0 - 36.0 g/dL   RDW 13.6 11.5 - 14.5 %   Platelets 332 150 - 440 K/uL  Basic metabolic panel  Result Value Ref Range   Sodium 135 135 - 145 mmol/L   Potassium 4.0 3.5 - 5.1 mmol/L   Chloride 104 101 - 111 mmol/L   CO2 25 22 - 32 mmol/L   Glucose, Bld 125 (H) 65 - 99 mg/dL   BUN 11 6 - 20 mg/dL   Creatinine, Ser 1.18 (H) 0.44 - 1.00 mg/dL   Calcium 8.7 (L) 8.9 - 10.3 mg/dL   GFR calc non Af Amer 56 (L) >60 mL/min   GFR calc Af Amer >60 >60 mL/min   Anion gap 6 5 - 15  Urinalysis complete, with microscopic ([facility] only)  Result Value Ref Range   Color, Urine RED (A) YELLOW   APPearance HAZY (A) CLEAR   Glucose, UA NEGATIVE NEGATIVE mg/dL   Bilirubin Urine NEGATIVE NEGATIVE   Ketones, ur NEGATIVE NEGATIVE mg/dL   Specific Gravity, Urine 1.014 1.005 - 1.030   Hgb urine dipstick 3+ (A) NEGATIVE   pH 5.0 5.0 - 8.0   Protein, ur 100 (A) NEGATIVE mg/dL   Nitrite NEGATIVE NEGATIVE   Leukocytes, UA NEGATIVE NEGATIVE   RBC / HPF TOO NUMEROUS TO COUNT 0 - 5 RBC/hpf   WBC, UA TOO NUMEROUS TO COUNT 0 - 5 WBC/hpf   Bacteria, UA FEW (A) NONE SEEN   Squamous Epithelial / LPF 0-5 (A) NONE SEEN  Troponin I  Result Value Ref Range   Troponin I <0.03 <0.03 ng/mL  TSH  Result Value Ref Range   TSH 3.717 0.350 - 4.500 uIU/mL  Rapid HIV screen (HIV 1/2 Ab+Ag)  Result Value Ref Range   HIV-1 P24 Antigen - HIV24 NON REACTIVE NON REACTIVE   HIV 1/2 Antibodies NON REACTIVE NON REACTIVE   Interpretation (HIV Ag Ab)      A non reactive test result means that HIV 1 or HIV 2 antibodies and HIV 1 p24 antigen were not detected in the specimen.  RPR  Result Value Ref Range   RPR Ser  Ql Non Reactive Non Reactive  Hepatitis B surface antigen  Result Value Ref Range   Hepatitis B Surface Ag Negative Negative  Hepatitis C antibody  Result Value Ref Range   HCV Ab <0.1 0.0 - 0.9 s/co ratio  Hemoglobin and hematocrit, blood  Result Value Ref Range   Hemoglobin 7.6 (L) 12.0 - 16.0 g/dL   HCT 22.6 (L) 35.0 - 47.0 %  Hemoglobin and hematocrit, blood  Result Value Ref Range   Hemoglobin 8.8 (L) 12.0 - 16.0 g/dL   HCT 26.7 (L) 35.0 - 47.0 %  Pregnancy, urine POC  Result Value Ref Range   Preg Test, Ur NEGATIVE NEGATIVE  Type and screen  Result Value Ref Range   ABO/RH(D) O POS    Antibody Screen NEG    Sample Expiration 04/02/2016    Unit Number [id number]    Blood Component Type RCLI PHER 2    Unit division 00    Status of Unit ISSUED,FINAL    Transfusion Status OK TO TRANSFUSE    Crossmatch Result Compatible    Unit Number [id number]    Blood Component Type RCLI  PHER 1    Unit division 00    Status of Unit ISSUED,FINAL    Transfusion Status OK TO TRANSFUSE    Crossmatch Result Compatible   Prepare RBC  Result Value Ref Range   Order Confirmation ORDER PROCESSED BY BLOOD BANK   ABO/Rh  Result Value Ref Range   ABO/RH(D) O POS      Imaging (Pelvic Ultrasound 03/30/16)  CLINICAL DATA:  Abnormal uterine bleeding for 6 months.  EXAM: TRANSABDOMINAL AND TRANSVAGINAL ULTRASOUND OF PELVIS  TECHNIQUE: Both transabdominal and transvaginal ultrasound examinations of the pelvis were performed. Transabdominal technique was performed for global imaging of the pelvis including uterus, ovaries, adnexal regions, and pelvic cul-de-sac. It was necessary to proceed with endovaginal exam following the transabdominal exam to visualize the endometrium.  COMPARISON:  None  FINDINGS: Uterus  Measurements: 9.3 x 5.5 x 6.3 cm. No fibroids or other mass visualized.  Endometrium  Thickness: 17 mm. Thickened without discrete mass. Mildly hypervascular endometrium.  Right ovary  Measurements: 4 x 2.5 x 3.4 cm. Multiple dominant follicles measuring up to 2.2 cm.  Left ovary  Measurements: 5.7 x 2.6 x 3.4 cm. Anechoic 5.1 cm simple cyst. Otherwise normal in appearance.  Other findings  No abnormal free fluid.  IMPRESSION: Abnormally thickened endometrium. If bleeding remains unresponsive to hormonal or medical therapy, focal lesion work-up with sonohysterogram should be considered. Endometrial biopsy should also be considered in pre-menopausal patients at high risk for endometrial carcinoma. (Ref: Radiological Reasoning: Algorithmic Workup of Abnormal Vaginal Bleeding with Endovaginal Sonography and Sonohysterography. AJR 2008; [id number])  5.1 cm LEFT ovarian cyst. This is almost certainly benign, but follow up ultrasound is recommended in 1 year according to the Society of Radiologists in Ultrasound 2010 Consensus  Conference Statement (D Clovis Riley et al. Management of Asymptomatic Ovarian and Other Adnexal Cysts Imaged at [place]: Society of Radiologists in [place] Statement 2010. Radiology 256 (Sept 2010): [id number].).   Treatments: IV hydration and IV conjugated estrogen and blood products (2 units PRBC)  Discharge Exam: Vitals:   03/31/16 1012 03/31/16 1239  BP: 122/70 (!) 122/56  Pulse: 82 70  Resp: 20 18  Temp: 97.6 F (36.4 C) 98.4 F (36.9 C)    General: alert and no distress Resp: clear to auscultation bilaterally Cardio: regular rate and rhythm, S1, S2 normal, no murmur, click, rub  or gallop GI: soft, non-tender; bowel sounds normal; no masses,  no organomegaly Extremities: extremities normal, atraumatic, no cyanosis or edema Vaginal Bleeding: moderate  Disposition: 01-Home or Self Care  Patient Instructions:    Medication List    TAKE these medications   citalopram 40 MG tablet Commonly known as:  CELEXA Take 40 mg by mouth daily.   docusate sodium 100 MG capsule Commonly known as:  COLACE Take 1 capsule (100 mg total) by mouth daily as needed for mild constipation.   ferrous sulfate 325 (65 FE) MG tablet Commonly known as:  FERROUSUL Take 1 tablet (325 mg total) by mouth 3 (three) times daily with meals. What changed:  how much to take  when to take this   loratadine 10 MG tablet Commonly known as:  CLARITIN Take 10 mg by mouth daily.   metFORMIN 500 MG tablet Commonly known as:  GLUCOPHAGE Take 2 tablets by mouth daily.   norethindrone 5 MG tablet Commonly known as:  AYGESTIN Take 1 tablet (5 mg total) by mouth daily.      Activity: activity as tolerated and no driving for today Diet: regular diet Wound Care: none needed  Follow-up with Dr. Marcelline Mates in 2 weeks.  Signed: [name], MD 04/01/2016 10:43 PM

## 2016-04-10 ENCOUNTER — Telehealth: Payer: Self-pay | Admitting: Obstetrics and Gynecology

## 2016-04-10 NOTE — Telephone Encounter (Signed)
Dr Marcelline Mates saw pt in ER 11/17 and gave medication to stop bleeding. She was given 2 units of blood.  She is still bleeding. Not real heavy. She is wondering if this is ok or does she need to increase medication to stop thr bleeding. She has an appt 12/6 to see Dr. Marcelline Mates.

## 2016-04-10 NOTE — Telephone Encounter (Signed)
Per Dr.Cherry's ER note this pt was supposed to follow up at [facility] (as she is their pt and they had started to manage pt's bleeding) I would recommend that pt call there and get their recommendations unless she just specifically wants to transfer to [facility].

## 2016-04-19 ENCOUNTER — Ambulatory Visit: Payer: Self-pay | Admitting: Obstetrics and Gynecology

## 2016-05-02 ENCOUNTER — Observation Stay
Admission: EM | Admit: 2016-05-02 | Discharge: 2016-05-03 | Disposition: A | Discharge status: Home / Self Care | Payer: BLUE CROSS/BLUE SHIELD | Attending: Obstetrics and Gynecology | Admitting: Obstetrics and Gynecology

## 2016-05-02 ENCOUNTER — Encounter: Payer: Self-pay | Admitting: Emergency Medicine

## 2016-05-02 DIAGNOSIS — D649 Anemia, unspecified: Secondary | ICD-10-CM | POA: Diagnosis present

## 2016-05-02 DIAGNOSIS — Z793 Long term (current) use of hormonal contraceptives: Secondary | ICD-10-CM | POA: Diagnosis not present

## 2016-05-02 DIAGNOSIS — E119 Type 2 diabetes mellitus without complications: Secondary | ICD-10-CM | POA: Insufficient documentation

## 2016-05-02 DIAGNOSIS — F329 Major depressive disorder, single episode, unspecified: Secondary | ICD-10-CM | POA: Insufficient documentation

## 2016-05-02 DIAGNOSIS — N938 Other specified abnormal uterine and vaginal bleeding: Secondary | ICD-10-CM | POA: Diagnosis not present

## 2016-05-02 DIAGNOSIS — Z7984 Long term (current) use of oral hypoglycemic drugs: Secondary | ICD-10-CM | POA: Diagnosis not present

## 2016-05-02 DIAGNOSIS — Z79899 Other long term (current) drug therapy: Secondary | ICD-10-CM | POA: Diagnosis not present

## 2016-05-02 DIAGNOSIS — N939 Abnormal uterine and vaginal bleeding, unspecified: Secondary | ICD-10-CM

## 2016-05-02 DIAGNOSIS — Z9289 Personal history of other medical treatment: Secondary | ICD-10-CM

## 2016-05-02 HISTORY — DX: Personal history of other medical treatment: Z92.89

## 2016-05-02 LAB — CBC WITH DIFFERENTIAL/PLATELET
BASOS ABS: 0.1 10*3/uL (ref 0–0.1)
BASOS PCT: 0 %
Basophils Absolute: 0.1 10*3/uL (ref 0–0.1)
Basophils Absolute: 0.1 10*3/uL (ref 0–0.1)
Basophils Relative: 0 %
Basophils Relative: 1 %
EOS ABS: 0.1 10*3/uL (ref 0–0.7)
EOS ABS: 0.1 10*3/uL (ref 0–0.7)
EOS PCT: 1 %
EOS PCT: 1 %
Eosinophils Absolute: 0.1 10*3/uL (ref 0–0.7)
Eosinophils Relative: 1 %
HCT: 23.8 % — ABNORMAL LOW (ref 35.0–47.0)
HEMATOCRIT: 30.6 % — AB (ref 35.0–47.0)
HEMATOCRIT: 26.3 % — AB (ref 35.0–47.0)
HEMOGLOBIN: 7.8 g/dL — AB (ref 12.0–16.0)
Hemoglobin: 10.2 g/dL — ABNORMAL LOW (ref 12.0–16.0)
Hemoglobin: 8.7 g/dL — ABNORMAL LOW (ref 12.0–16.0)
LYMPHS ABS: 2.1 10*3/uL (ref 1.0–3.6)
LYMPHS PCT: 16 %
Lymphocytes Relative: 18 %
Lymphocytes Relative: 12 %
Lymphs Abs: 2.5 10*3/uL (ref 1.0–3.6)
Lymphs Abs: 1.4 10*3/uL (ref 1.0–3.6)
MCH: 28.5 pg (ref 26.0–34.0)
MCH: 28 pg (ref 26.0–34.0)
MCH: 27.8 pg (ref 26.0–34.0)
MCHC: 33.4 g/dL (ref 32.0–36.0)
MCHC: 33 g/dL (ref 32.0–36.0)
MCHC: 33 g/dL (ref 32.0–36.0)
MCV: 85.1 fL (ref 80.0–100.0)
MCV: 85 fL (ref 80.0–100.0)
MCV: 84.4 fL (ref 80.0–100.0)
MONO ABS: 1 10*3/uL — AB (ref 0.2–0.9)
MONO ABS: 0.9 10*3/uL (ref 0.2–0.9)
MONOS PCT: 7 %
MONOS PCT: 8 %
Monocytes Absolute: 1 10*3/uL — ABNORMAL HIGH (ref 0.2–0.9)
Monocytes Relative: 7 %
NEUTROS ABS: 9.9 10*3/uL — AB (ref 1.4–6.5)
NEUTROS PCT: 74 %
Neutro Abs: 10.5 10*3/uL — ABNORMAL HIGH (ref 1.4–6.5)
Neutro Abs: 9.6 10*3/uL — ABNORMAL HIGH (ref 1.4–6.5)
Neutrophils Relative %: 74 %
Neutrophils Relative %: 80 %
Platelets: 436 10*3/uL (ref 150–440)
Platelets: 333 10*3/uL (ref 150–440)
Platelets: 329 10*3/uL (ref 150–440)
RBC: 3.59 MIL/uL — ABNORMAL LOW (ref 3.80–5.20)
RBC: 3.09 MIL/uL — ABNORMAL LOW (ref 3.80–5.20)
RBC: 2.81 MIL/uL — AB (ref 3.80–5.20)
RDW: 14.2 % (ref 11.5–14.5)
RDW: 14.8 % — AB (ref 11.5–14.5)
RDW: 14.4 % (ref 11.5–14.5)
WBC: 14.3 10*3/uL — ABNORMAL HIGH (ref 3.6–11.0)
WBC: 12.4 10*3/uL — ABNORMAL HIGH (ref 3.6–11.0)
WBC: 12.8 10*3/uL — AB (ref 3.6–11.0)

## 2016-05-02 LAB — COMPREHENSIVE METABOLIC PANEL
ALBUMIN: 4 g/dL (ref 3.5–5.0)
ALK PHOS: 72 U/L (ref 38–126)
ALT: 16 U/L (ref 14–54)
ANION GAP: 9 (ref 5–15)
AST: 20 U/L (ref 15–41)
BILIRUBIN TOTAL: 0.7 mg/dL (ref 0.3–1.2)
BUN: 12 mg/dL (ref 6–20)
CALCIUM: 8.8 mg/dL — AB (ref 8.9–10.3)
CO2: 22 mmol/L (ref 22–32)
CREATININE: 1.21 mg/dL — AB (ref 0.44–1.00)
Chloride: 104 mmol/L (ref 101–111)
GFR calc Af Amer: >60 mL/min (ref >60)
GFR calc non Af Amer: 54 mL/min — ABNORMAL LOW (ref >60)
GLUCOSE: 140 mg/dL — AB (ref 65–99)
Potassium: 4.3 mmol/L (ref 3.5–5.1)
Sodium: 135 mmol/L (ref 135–145)
TOTAL PROTEIN: 7.3 g/dL (ref 6.5–8.1)

## 2016-05-02 LAB — PREPARE RBC (CROSSMATCH)

## 2016-05-02 MED ORDER — LACTATED RINGERS IV SOLN: INTRAVENOUS | Status: DC

## 2016-05-02 MED ORDER — CITALOPRAM HYDROBROMIDE 20 MG PO TABS: 40.0000 mg | ORAL_TABLET | Freq: Every day | ORAL | Status: DC

## 2016-05-02 MED ORDER — SODIUM CHLORIDE 0.9 % IV BOLUS (SEPSIS)
1000.0000 mL | Freq: Once | INTRAVENOUS | Status: AC
Start: 2016-05-02 — End: 2016-05-02
  Administered 2016-05-02: 1000 mL via INTRAVENOUS

## 2016-05-02 MED ORDER — METFORMIN HCL 500 MG PO TABS: 1000.0000 mg | ORAL_TABLET | Freq: Every day | ORAL | Status: DC

## 2016-05-02 MED ORDER — LACTATED RINGERS IV SOLN
INTRAVENOUS | Status: DC
  Administered 2016-05-02 – 2016-05-03 (×2): via INTRAVENOUS

## 2016-05-02 MED ORDER — NORETHINDRONE ACETATE 5 MG PO TABS
5.0000 mg | ORAL_TABLET | Freq: Every day | ORAL | Status: DC
  Administered 2016-05-03: 5 mg via ORAL
  Filled 2016-05-02 (×3): qty 1

## 2016-05-02 MED ORDER — SODIUM CHLORIDE 0.9 % IV SOLN
Freq: Once | INTRAVENOUS | Status: AC
  Administered 2016-05-02: 22:00:00 via INTRAVENOUS

## 2016-05-02 MED ORDER — ACETAMINOPHEN 500 MG PO TABS
1000.0000 mg | ORAL_TABLET | Freq: Once | ORAL | Status: AC
  Administered 2016-05-02: 1000 mg via ORAL
  Filled 2016-05-02: qty 2

## 2016-05-02 MED ORDER — IBUPROFEN 600 MG PO TABS
600.0000 mg | ORAL_TABLET | Freq: Four times a day (QID) | ORAL | Status: DC | PRN
  Filled 2016-05-02: qty 1

## 2016-05-02 MED ORDER — FERROUS SULFATE 325 (65 FE) MG PO TABS: 325.0000 mg | ORAL_TABLET | Freq: Three times a day (TID) | ORAL | Status: DC

## 2016-05-02 MED ORDER — DIPHENHYDRAMINE HCL 25 MG PO CAPS
25.0000 mg | ORAL_CAPSULE | Freq: Once | ORAL | Status: AC
  Administered 2016-05-02: 25 mg via ORAL
  Filled 2016-05-02: qty 1

## 2016-05-02 MED ORDER — DOCUSATE SODIUM 100 MG PO CAPS: 100.0000 mg | ORAL_CAPSULE | Freq: Every day | ORAL | Status: DC | PRN

## 2016-05-02 NOTE — ED Provider Notes (Signed)
[facility] Provider Note   CSN: [id number] Arrival date & time: 05/02/16  1145     History   Chief Complaint Chief Complaint  Patient presents with  . Vaginal Bleeding    HPI Sherry Hobbs is a 45 y.o. female hx of depression, ovarian cyst, dysfunctional uterine bleeding, here with vaginal bleeding. Patient was admitted a month ago for vaginal bleeding and was transfused 2 U PRBC. She was given estrogen and currently on seasonale. For the last 5 days she's been having persistent vaginal bleeding. She states that about 3-4 to have the day. Patient has been feeling weak and dizzy and felt like she is on pass out. Denies any chest pain. She called GYN office and was told to come to the ED for evaluation.   The history is provided by the patient.    Past Medical History:  Diagnosis Date  . Depression     Patient Active Problem List   Diagnosis Date Noted  . DUB (dysfunctional uterine bleeding) 03/31/2016  . Ovarian cyst 03/31/2016  . Anemia 03/30/2016    History reviewed. No pertinent surgical history.  OB History    No data available       Home Medications    Prior to Admission medications   Medication Sig Start Date End Date Taking? Authorizing Provider  citalopram (CELEXA) 40 MG tablet Take 40 mg by mouth daily.   Yes Historical Provider, MD  docusate sodium (COLACE) 100 MG capsule Take 1 capsule (100 mg total) by mouth daily as needed for mild constipation. 03/31/16  Yes [name], MD  ferrous sulfate (FERROUSUL) 325 (65 FE) MG tablet Take 1 tablet (325 mg total) by mouth 3 (three) times daily with meals. 03/31/16  Yes [name], MD  levonorgestrel-ethinyl estradiol (SEASONALE,INTROVALE,JOLESSA) 0.15-0.03 MG tablet Take 1 tablet by mouth daily. 04/13/16  Yes Historical Provider, MD  loratadine (CLARITIN) 10 MG tablet Take 10 mg by mouth daily.   Yes Historical Provider, MD  metFORMIN (GLUCOPHAGE) 500 MG tablet Take 2 tablets by mouth daily.  02/29/16  Yes Historical Provider, MD  norethindrone (AYGESTIN) 5 MG tablet Take 1 tablet (5 mg total) by mouth daily. Patient not taking: Reported on 05/02/2016 03/31/16   [name], MD    Family History Family History  Problem Relation Age of Onset  . Diabetes Mother   . COPD Mother   . Emphysema Mother   . Osteoporosis Mother   . CAD Father   . Diabetes Sister   . Diabetes Brother   . CAD Brother   . Renal cancer Maternal Uncle     1 uncle  . Brain cancer Maternal Uncle     different uncle  . Diabetes Maternal Grandmother   . Stroke Maternal Grandmother   . Bladder Cancer Maternal Grandfather     Social History Social History  Substance Use Topics  . Smoking status: Never Smoker  . Smokeless tobacco: Never Used  . Alcohol use No     Allergies   Codeine   Review of Systems Review of Systems  Genitourinary: Positive for vaginal bleeding.  Neurological: Positive for dizziness.  All other systems reviewed and are negative.    Physical Exam Updated Vital Signs BP 136/81 (BP Location: Left Arm)   Pulse (!) 122   Temp 97.9 F (36.6 C) (Oral)   Resp 18   Ht 5\' 2"  (1.575 m)   Wt 175 lb (79.4 kg)   SpO2 100%   BMI 32.01 kg/m   Physical  Exam  Constitutional: She is oriented to person, place, and time.  Slightly pale   HENT:  Head: Normocephalic.  Mouth/Throat: Oropharynx is clear and moist.  Eyes: Pupils are equal, round, and reactive to light.  Conjunctiva pale   Neck: Normal range of motion. Neck supple.  Cardiovascular: Regular rhythm and normal heart sounds.   Tachycardic   Pulmonary/Chest: Effort normal and breath sounds normal. No respiratory distress. She has no wheezes.  Abdominal: Soft. Bowel sounds are normal. She exhibits no distension. There is no tenderness.  Genitourinary:  Genitourinary Comments: Os closed. Blood clots in the vaginal vault with some bleeding. No uterine or adnexal tenderness   Musculoskeletal: Normal range of motion.   Neurological: She is alert and oriented to person, place, and time. No cranial nerve deficit. Coordination normal.  Skin: Skin is warm.  Psychiatric: She has a normal mood and affect.  Nursing note and vitals reviewed.    ED Treatments / Results  Labs (all labs ordered are listed, but only abnormal results are displayed) Labs Reviewed  CBC WITH DIFFERENTIAL/PLATELET - Abnormal; Notable for the following:       Result Value   WBC 14.3 (*)    RBC 3.59 (*)    Hemoglobin 10.2 (*)    HCT 30.6 (*)    Neutro Abs 10.5 (*)    Monocytes Absolute 1.0 (*)    All other components within normal limits  COMPREHENSIVE METABOLIC PANEL - Abnormal; Notable for the following:    Glucose, Bld 140 (*)    Creatinine, Ser 1.21 (*)    Calcium 8.8 (*)    GFR calc non Af Amer 54 (*)    All other components within normal limits  CBC WITH DIFFERENTIAL/PLATELET - Abnormal; Notable for the following:    WBC 12.4 (*)    RBC 3.09 (*)    Hemoglobin 8.7 (*)    HCT 26.3 (*)    RDW 14.8 (*)    Neutro Abs 9.9 (*)    All other components within normal limits  TYPE AND SCREEN    EKG  EKG Interpretation None       Radiology No results found.  Procedures Procedures (including critical care time)  Medications Ordered in ED Medications  sodium chloride 0.9 % bolus 1,000 mL (0 mLs Intravenous Stopped 05/02/16 1439)     Initial Impression / Assessment and Plan / ED Course  I have reviewed the triage vital signs and the nursing notes.  Pertinent labs & imaging results that were available during my care of the patient were reviewed by me and considered in my medical decision making (see chart for details).  Clinical Course     Sherry Hobbs is a 45 y.o. female here with persistent vaginal bleeding. Patient symptomatic and tachycardic in the ED. Has some blood in the vaginal vault. Will check CBC, labs, give IVF.  3:19 PM HR 120s when she stands up. Patient orthostatic. Had another large episode  of vaginal bleeding in the ED. Repeat Hg dropped from 10 to 8.7. Talked to Dr. Marcelline Mates, who will admit for observation and discussion of long term management.     Final Clinical Impressions(s) / ED Diagnoses   Final diagnoses:  None    New Prescriptions New Prescriptions   No medications on file     Drenda Freeze, MD 05/02/16 1520

## 2016-05-02 NOTE — ED Triage Notes (Signed)
Reports vaginal bleeding.  States last time this happened she had to receive blood.  Skin w/d and pale.

## 2016-05-02 NOTE — ED Notes (Signed)
Secretary has paged Dr. Marcelline Mates.

## 2016-05-02 NOTE — H&P (Signed)
OBSTETRICS/GYNECOLOGY HISTORY AND PHYSICAL  Reason for Consult: Symptomatic anemia Referring Physician: Dr. Shirlyn Goltz (ER Physician)  HPI:  Sherry Hobbs is an 45 y.o. G1P1 female with last menstrual period 03/27/2016. Patient has a history of irregular periods for the past several months.  She was admitted from the Emergency Room for symptomatic anemia. Patient presented with complaints of palpitations, and dizziness. Patient notes that she had previously been on Depo-Provera injections for approximately 16 years. She discontinued the Depo-Provera in July and had an episode of heavy vaginal bleeding from July to October. Patient was placed on Provera for 10 days. Patient was admitted last month for another episode of heavy vaginal bleeding, requiring blood transfusion (2 units).  Was discontinued from Aygestin and placed on Seasonale.  Notes compliance with medications, but states that she began bleeding last week (possibly period, but started earlier than usual).  Was supposed to have outpatient f/u in clinic but did not show.  Bleeding became heavier over the past few days, and patient began to notice heart palpitations and dizziness, so she presented to the ER today. Notes that she has been changing approximately 4 soaked pads per day.   Pertinent Gynecological History: Menarche age: 41 Menses: Patient has not had a period in 16 years due to Depo-Provera injections, has been bleeding since July after coming off of the Depo-Provera. Bleeding: dysfunctional uterine bleeding Contraception: none.  Notes that she and husband were thinking about conceiving again. DES exposure: denies Blood transfusions: none Sexually transmitted diseases: no past history Previous GYN Procedures: None  Last mammogram: normal Date: 2 years ago Last pap: normal Date: ~ 3 weeks ago OB History: SVD x 1    Past Medical History:  Diagnosis Date  . Depression   . History of blood transfusion 05/02/2016    History  reviewed. No pertinent surgical history.  Family History  Problem Relation Age of Onset  . Diabetes Mother   . COPD Mother   . Emphysema Mother   . Osteoporosis Mother   . CAD Father   . Diabetes Sister   . Diabetes Brother   . CAD Brother   . Renal cancer Maternal Uncle     1 uncle  . Brain cancer Maternal Uncle     different uncle  . Diabetes Maternal Grandmother   . Stroke Maternal Grandmother   . Bladder Cancer Maternal Grandfather      Social History   Social History  . Marital status: Married    Spouse name: N/A  . Number of children: N/A  . Years of education: N/A   Occupational History  . Not on file.   Social History Main Topics  . Smoking status: Never Smoker  . Smokeless tobacco: Never Used  . Alcohol use No  . Drug use: Unknown  . Sexual activity: Not on file   Other Topics Concern  . Not on file   Social History Narrative  . No narrative on file    Allergies:  Allergies  Allergen Reactions  . Codeine Nausea And Vomiting    Medications:  Prescriptions Prior to Admission  Medication Sig Dispense Refill Last Dose  . citalopram (CELEXA) 40 MG tablet Take 40 mg by mouth daily.   05/02/2016 at 1000  . docusate sodium (COLACE) 100 MG capsule Take 1 capsule (100 mg total) by mouth daily as needed for mild constipation. 30 capsule 2 05/02/2016 at 1000  . ferrous sulfate (FERROUSUL) 325 (65 FE) MG tablet Take 1 tablet (325 mg total)  by mouth 3 (three) times daily with meals. 90 tablet 2 05/02/2016 at 1000  . levonorgestrel-ethinyl estradiol (SEASONALE,INTROVALE,JOLESSA) 0.15-0.03 MG tablet Take 1 tablet by mouth daily.  2 05/02/2016 at 1000  . loratadine (CLARITIN) 10 MG tablet Take 10 mg by mouth daily.   05/02/2016 at 1000  . metFORMIN (GLUCOPHAGE) 500 MG tablet Take 2 tablets by mouth daily.  10 05/02/2016 at 1000  . norethindrone (AYGESTIN) 5 MG tablet Take 1 tablet (5 mg total) by mouth daily. (Patient not taking: Reported on 05/02/2016) 30 tablet  2 Completed Course at Unknown time    Review of Systems  Constitutional: Negative for chills and fever.  HENT: Negative for nosebleeds, sore throat and tinnitus.   Eyes: Negative for blurred vision, double vision and discharge.  Respiratory: Negative for cough, shortness of breath and wheezing.   Cardiovascular: Positive for palpitations. Negative for chest pain and leg swelling.  Gastrointestinal: Negative for abdominal pain, heartburn, nausea and vomiting.  Genitourinary: Negative for dysuria, frequency, hematuria and urgency.  Musculoskeletal: Negative for back pain, joint pain, myalgias and neck pain.  Skin: Negative for itching and rash.  Neurological: Positive for dizziness. Negative for speech change, loss of consciousness, weakness and headaches.  Endo/Heme/Allergies: Negative for polydipsia. Does not bruise/bleed easily.  Psychiatric/Behavioral: Negative for depression, substance abuse and suicidal ideas.    Blood pressure (!) 148/66, pulse (!) 102, temperature 98.9 F (37.2 C), temperature source Oral, resp. rate 17, height '5\' 2"'  (1.575 m), weight 175 lb (79.4 kg), SpO2 100 %. Physical Exam  Constitutional: She is oriented to person, place, and time. She appears well-developed and well-nourished.  HENT:  Head: Normocephalic and atraumatic.  Right Ear: External ear normal.  Left Ear: External ear normal.  Mouth/Throat: Oropharynx is clear and moist.  Eyes: Conjunctivae and EOM are normal. Pupils are equal, round, and reactive to light. No scleral icterus.  Neck: Normal range of motion. Neck supple. No JVD present. No tracheal deviation present. No thyromegaly present.  Cardiovascular: Regular rhythm and normal heart sounds.  Exam reveals no friction rub.   No murmur heard. Borderline tachycardia  GI: Soft. Bowel sounds are normal. She exhibits no distension and no mass. There is no tenderness. There is no rebound.  Genitourinary: Vagina normal and uterus normal. Rectal exam  shows guaiac negative stool.  Genitourinary Comments: Dark red blood in vaginal vault with several small to medium sized clots.   Musculoskeletal: Normal range of motion. She exhibits no edema or deformity.  Lymphadenopathy:    She has no cervical adenopathy.  Neurological: She is alert and oriented to person, place, and time.  Skin: Skin is warm and dry. She is not diaphoretic.  Psychiatric: She has a normal mood and affect. Her behavior is normal. Thought content normal.     Labs:  Results for orders placed or performed during the hospital encounter of 05/02/16 (from the past 48 hour(s))  CBC with Differential     Status: Abnormal   Collection Time: 05/02/16 11:55 AM  Result Value Ref Range   WBC 14.3 (H) 3.6 - 11.0 K/uL   RBC 3.59 (L) 3.80 - 5.20 MIL/uL   Hemoglobin 10.2 (L) 12.0 - 16.0 g/dL   HCT 30.6 (L) 35.0 - 47.0 %   MCV 85.1 80.0 - 100.0 fL   MCH 28.5 26.0 - 34.0 pg   MCHC 33.4 32.0 - 36.0 g/dL   RDW 14.2 11.5 - 14.5 %   Platelets 436 150 - 440 K/uL   Neutrophils  Relative % 74 %   Neutro Abs 10.5 (H) 1.4 - 6.5 K/uL   Lymphocytes Relative 18 %   Lymphs Abs 2.5 1.0 - 3.6 K/uL   Monocytes Relative 7 %   Monocytes Absolute 1.0 (H) 0.2 - 0.9 K/uL   Eosinophils Relative 1 %   Eosinophils Absolute 0.1 0 - 0.7 K/uL   Basophils Relative 0 %   Basophils Absolute 0.1 0 - 0.1 K/uL  Comprehensive metabolic panel     Status: Abnormal   Collection Time: 05/02/16 11:55 AM  Result Value Ref Range   Sodium 135 135 - 145 mmol/L   Potassium 4.3 3.5 - 5.1 mmol/L   Chloride 104 101 - 111 mmol/L   CO2 22 22 - 32 mmol/L   Glucose, Bld 140 (H) 65 - 99 mg/dL   BUN 12 6 - 20 mg/dL   Creatinine, Ser 1.21 (H) 0.44 - 1.00 mg/dL   Calcium 8.8 (L) 8.9 - 10.3 mg/dL   Total Protein 7.3 6.5 - 8.1 g/dL   Albumin 4.0 3.5 - 5.0 g/dL   AST 20 15 - 41 U/L   ALT 16 14 - 54 U/L   Alkaline Phosphatase 72 38 - 126 U/L   Total Bilirubin 0.7 0.3 - 1.2 mg/dL   GFR calc non Af Amer 54 (L) >60 mL/min    GFR calc Af Amer >60 >60 mL/min    Comment: (NOTE) The eGFR has been calculated using the CKD EPI equation. This calculation has not been validated in all clinical situations. eGFR's persistently <60 mL/min signify possible Chronic Kidney Disease.    Anion gap 9 5 - 15  Type and screen [facility]     Status: None   Collection Time: 05/02/16 11:56 AM  Result Value Ref Range   ABO/RH(D) O POS    Antibody Screen POS    Sample Expiration 05/05/2016   CBC with Differential/Platelet     Status: Abnormal   Collection Time: 05/02/16  1:51 PM  Result Value Ref Range   WBC 12.4 (H) 3.6 - 11.0 K/uL   RBC 3.09 (L) 3.80 - 5.20 MIL/uL   Hemoglobin 8.7 (L) 12.0 - 16.0 g/dL   HCT 26.3 (L) 35.0 - 47.0 %   MCV 85.0 80.0 - 100.0 fL   MCH 28.0 26.0 - 34.0 pg   MCHC 33.0 32.0 - 36.0 g/dL   RDW 14.8 (H) 11.5 - 14.5 %   Platelets 333 150 - 440 K/uL   Neutrophils Relative % 80 %   Neutro Abs 9.9 (H) 1.4 - 6.5 K/uL   Lymphocytes Relative 12 %   Lymphs Abs 1.4 1.0 - 3.6 K/uL   Monocytes Relative 7 %   Monocytes Absolute 0.9 0.2 - 0.9 K/uL   Eosinophils Relative 1 %   Eosinophils Absolute 0.1 0 - 0.7 K/uL   Basophils Relative 0 %   Basophils Absolute 0.1 0 - 0.1 K/uL    esults for orders placed or performed during the hospital encounter of 03/30/16  TSH  Result Value Ref Range   TSH 3.717 0.350 - 4.500 uIU/mL  Rapid HIV screen (HIV 1/2 Ab+Ag)  Result Value Ref Range   HIV-1 P24 Antigen - HIV24 NON REACTIVE NON REACTIVE   HIV 1/2 Antibodies NON REACTIVE NON REACTIVE   Interpretation (HIV Ag Ab)      A non reactive test result means that HIV 1 or HIV 2 antibodies and HIV 1 p24 antigen were not detected in the specimen.  Imaging (Pelvic Ultrasound 03/30/2016):  CLINICAL DATA:  Abnormal uterine bleeding for 6 months.  EXAM: TRANSABDOMINAL AND TRANSVAGINAL ULTRASOUND OF PELVIS  TECHNIQUE: Both transabdominal and transvaginal ultrasound examinations of the  pelvis were performed. Transabdominal technique was performed for global imaging of the pelvis including uterus, ovaries, adnexal regions, and pelvic cul-de-sac. It was necessary to proceed with endovaginal exam following the transabdominal exam to visualize the endometrium.  COMPARISON:  None  FINDINGS: Uterus  Measurements: 9.3 x 5.5 x 6.3 cm. No fibroids or other mass visualized.  Endometrium  Thickness: 17 mm. Thickened without discrete mass. Mildly hypervascular endometrium.  Right ovary  Measurements: 4 x 2.5 x 3.4 cm. Multiple dominant follicles measuring up to 2.2 cm.  Left ovary  Measurements: 5.7 x 2.6 x 3.4 cm. Anechoic 5.1 cm simple cyst.Otherwise normal in appearance.  Other findings  No abnormal free fluid.  IMPRESSION: Abnormally thickened endometrium. If bleeding remains unresponsive to hormonal or medical therapy, focal lesion work-up with sonohysterogram should be considered. Endometrial biopsy should also be considered in pre-menopausal patients at high risk for endometrial carcinoma. (Ref: Radiological Reasoning: Algorithmic Workup of Abnormal Vaginal Bleeding with Endovaginal Sonography and Sonohysterography. AJR 2008; 283:T51-76)  5.1 cm LEFT ovarian cyst. This is almost certainly benign, but follow up ultrasound is recommended in 1 year according to the Society of Radiologists in Ultrasound 2010 Consensus Conference Statement (D Clovis Riley et al. Management of Asymptomatic Ovarian and Other Adnexal Cysts Imaged at [place]: Society of Radiologists in [place] Statement 2010. Radiology 256 (Sept 2010): 943-954.).   Assessment/Plan: 1. Symptomatic anemia - secondary to dysfunctional uterine bleeding.  Will admit patient and monitor.  Currently Hgb 8.7 (but decreased from 10.2 in ER earlier today, and patient symptomatic).  Will repeat CBC.  If still declining, will transfuse 1 unit PRBCs.   2. Dysfunctional uterine bleeding - patient  with long-term Depo-Provera use, currently on Seasonale.  Discontinued Seasonale, placed back on Aygestin for now.  Discussion had with patient again regarding management options.  Last admission patient declined all options except hormonal management as she and husband were thinking of trying to conceive again.  Revisited all management options for abnormal uterine bleeding including tranexamic acid (Lysteda), oral progesterone, Depo Provera, Mirena IUD, endometrial ablation (Novasure/Hydrothermal Ablation) or hysterectomy as definitive surgical management.  Discussed risks and benefits of each method.   Also discussed temporizing measure of D&C which would be diagnostic as well as therapeutic.  Patient notes that she is ok with that plan.  Will plan for [facility] tomorrow.  To be NPO at midnight.    [name], MD [facility] 05/02/2016

## 2016-05-02 NOTE — ED Triage Notes (Signed)
Heavy vaginal bleeding since last weds  Feels weak

## 2016-05-02 NOTE — ED Notes (Signed)
Pt states she is having heavy vaginal bleeding. States she has soaked 3 pads today. She reports that she was seen for the same in November. She states that this time she has more clots. Bleeding started on Wednesday. She reports that she is feeling very weak; called her OB/GYN office, and they told her to come here.

## 2016-05-03 ENCOUNTER — Encounter
Admission: EM | Disposition: A | Discharge status: Home / Self Care | Payer: Self-pay | Source: Home / Self Care | Attending: Emergency Medicine

## 2016-05-03 ENCOUNTER — Observation Stay: Payer: BLUE CROSS/BLUE SHIELD | Admitting: Certified Registered"

## 2016-05-03 DIAGNOSIS — D6489 Other specified anemias: Secondary | ICD-10-CM | POA: Diagnosis not present

## 2016-05-03 DIAGNOSIS — N938 Other specified abnormal uterine and vaginal bleeding: Secondary | ICD-10-CM | POA: Diagnosis not present

## 2016-05-03 HISTORY — PX: DILATION AND CURETTAGE OF UTERUS: SHX78

## 2016-05-03 LAB — HEMOGLOBIN AND HEMATOCRIT, BLOOD
HEMATOCRIT: 24.9 % — AB (ref 35.0–47.0)
HEMOGLOBIN: 8.5 g/dL — AB (ref 12.0–16.0)

## 2016-05-03 LAB — PREGNANCY, URINE: Preg Test, Ur: NEGATIVE

## 2016-05-03 LAB — TYPE AND SCREEN
ABO/RH(D): O POS
ANTIBODY SCREEN: POSITIVE
UNIT DIVISION: 0

## 2016-05-03 LAB — GLUCOSE, CAPILLARY: Glucose-Capillary: 104 mg/dL — ABNORMAL HIGH (ref 65–99)

## 2016-05-03 SURGERY — DILATION AND CURETTAGE
Anesthesia: General | Wound class: Clean Contaminated

## 2016-05-03 MED ORDER — LIDOCAINE HCL (CARDIAC) 20 MG/ML IV SOLN
INTRAVENOUS | Status: DC | PRN
  Administered 2016-05-03: 100 mg via INTRAVENOUS

## 2016-05-03 MED ORDER — PHENYLEPHRINE 40 MCG/ML (10ML) SYRINGE FOR IV PUSH (FOR BLOOD PRESSURE SUPPORT)
PREFILLED_SYRINGE | INTRAVENOUS | Status: AC
  Filled 2016-05-03: qty 10

## 2016-05-03 MED ORDER — FENTANYL CITRATE (PF) 100 MCG/2ML IJ SOLN: 25.0000 ug | INTRAMUSCULAR | Status: DC | PRN

## 2016-05-03 MED ORDER — ONDANSETRON HCL 4 MG/2ML IJ SOLN: 4.0000 mg | Freq: Once | INTRAMUSCULAR | Status: DC | PRN

## 2016-05-03 MED ORDER — FENTANYL CITRATE (PF) 100 MCG/2ML IJ SOLN
INTRAMUSCULAR | Status: DC | PRN
  Administered 2016-05-03: 100 ug via INTRAVENOUS
  Administered 2016-05-03: 50 ug via INTRAVENOUS

## 2016-05-03 MED ORDER — MIDAZOLAM HCL 2 MG/2ML IJ SOLN
INTRAMUSCULAR | Status: AC
  Filled 2016-05-03: qty 2

## 2016-05-03 MED ORDER — LIDOCAINE 2% (20 MG/ML) 5 ML SYRINGE
INTRAMUSCULAR | Status: AC
  Filled 2016-05-03: qty 5

## 2016-05-03 MED ORDER — FENTANYL CITRATE (PF) 100 MCG/2ML IJ SOLN
INTRAMUSCULAR | Status: AC
  Filled 2016-05-03: qty 2

## 2016-05-03 MED ORDER — SODIUM CHLORIDE 0.9 % IV SOLN
INTRAVENOUS | Status: DC | PRN
  Administered 2016-05-03: 16:00:00 via INTRAVENOUS

## 2016-05-03 MED ORDER — PHENYLEPHRINE HCL 10 MG/ML IJ SOLN
INTRAMUSCULAR | Status: DC | PRN
  Administered 2016-05-03: 120 ug via INTRAVENOUS
  Administered 2016-05-03 (×2): 160 ug via INTRAVENOUS
  Administered 2016-05-03: 120 ug via INTRAVENOUS
  Administered 2016-05-03 (×2): 40 ug via INTRAVENOUS
  Administered 2016-05-03: 80 ug via INTRAVENOUS

## 2016-05-03 MED ORDER — PROPOFOL 10 MG/ML IV BOLUS
INTRAVENOUS | Status: AC
  Filled 2016-05-03: qty 20

## 2016-05-03 MED ORDER — HYDROCODONE-ACETAMINOPHEN 5-325 MG PO TABS: 1.0000 | ORAL_TABLET | Freq: Four times a day (QID) | ORAL | 0 refills | Status: DC | PRN

## 2016-05-03 MED ORDER — ONDANSETRON HCL 4 MG/2ML IJ SOLN
INTRAMUSCULAR | Status: AC
  Filled 2016-05-03: qty 2

## 2016-05-03 MED ORDER — IBUPROFEN 600 MG PO TABS: 600.0000 mg | ORAL_TABLET | Freq: Four times a day (QID) | ORAL | 1 refills | Status: DC | PRN

## 2016-05-03 MED ORDER — MIDAZOLAM HCL 2 MG/2ML IJ SOLN
INTRAMUSCULAR | Status: DC | PRN
  Administered 2016-05-03: 2 mg via INTRAVENOUS

## 2016-05-03 MED ORDER — PROPOFOL 10 MG/ML IV BOLUS
INTRAVENOUS | Status: DC | PRN
  Administered 2016-05-03: 160 mg via INTRAVENOUS

## 2016-05-03 MED ORDER — ONDANSETRON HCL 4 MG/2ML IJ SOLN
INTRAMUSCULAR | Status: DC | PRN
  Administered 2016-05-03: 4 mg via INTRAVENOUS

## 2016-05-03 MED ORDER — NORETHINDRONE ACETATE 5 MG PO TABS: 5.0000 mg | ORAL_TABLET | Freq: Every day | ORAL | 1 refills | Status: DC

## 2016-05-03 SURGICAL SUPPLY — 24 items
CATH ROBINSON RED A/P 16FR (CATHETERS) ×4 IMPLANT
DRSG TELFA 3X8 NADH (GAUZE/BANDAGES/DRESSINGS) ×4 IMPLANT
FILTER UTR ASPR SPEC (MISCELLANEOUS) IMPLANT
FLTR UTR ASPR SPEC (MISCELLANEOUS)
GLOVE BIO SURGEON STRL SZ 6.5 (GLOVE) ×3 IMPLANT
GLOVE BIO SURGEONS STRL SZ 6.5 (GLOVE) ×1
GLOVE INDICATOR 7.0 STRL GRN (GLOVE) ×4 IMPLANT
GOWN STRL REUS W/ TWL LRG LVL3 (GOWN DISPOSABLE) ×2 IMPLANT
GOWN STRL REUS W/TWL LRG LVL3 (GOWN DISPOSABLE) ×2
KIT BERKELEY 1ST TRIMESTER 3/8 (MISCELLANEOUS) ×4 IMPLANT
KIT RM TURNOVER STRD PROC AR (KITS) ×4 IMPLANT
NEEDLE HYPO 25X1 1.5 SAFETY (NEEDLE) ×4 IMPLANT
NEEDLE SPNL 25GX3.5 QUINCKE BL (NEEDLE) ×4 IMPLANT
PACK DNC HYST (MISCELLANEOUS) ×4 IMPLANT
PAD OB MATERNITY 4.3X12.25 (PERSONAL CARE ITEMS) ×4 IMPLANT
PAD PREP 24X41 OB/GYN DISP (PERSONAL CARE ITEMS) ×4 IMPLANT
SET BERKELEY SUCTION TUBING (SUCTIONS) IMPLANT
SOL PREP PVP 2OZ (MISCELLANEOUS) ×4
SOLUTION PREP PVP 2OZ (MISCELLANEOUS) ×2 IMPLANT
SPONGE XRAY 4X4 16PLY STRL (MISCELLANEOUS) ×4 IMPLANT
SYRINGE 10CC LL (SYRINGE) ×4 IMPLANT
VACURETTE 10 RIGID CVD (CANNULA) IMPLANT
VACURETTE 12 RIGID CVD (CANNULA) IMPLANT
VACURETTE 8 RIGID CVD (CANNULA) IMPLANT

## 2016-05-03 NOTE — Anesthesia Postprocedure Evaluation (Signed)
Anesthesia Post Note  Patient: Tax inspector  Procedure(s) Performed: Procedure(s): DILATATION AND CURETTAGE  Patient location during evaluation: PACU Anesthesia Type: General Level of consciousness: awake and alert Pain management: pain level controlled Vital Signs Assessment: post-procedure vital signs reviewed and stable Respiratory status: spontaneous breathing and respiratory function stable Cardiovascular status: stable Anesthetic complications: no     Last Vitals:  Vitals:   05/03/16 1725 05/03/16 1740  BP: 116/61 128/67  Pulse: 83 96  Resp: (!) 22 (!) 24  Temp:  36.7 C    Last Pain:  Vitals:   05/03/16 1710  TempSrc:   PainSc: Asleep                 Denys Salinger K

## 2016-05-03 NOTE — Progress Notes (Signed)
HD#2: Admission for symptomatic anemia.   Subjective: Patient reports heavy vaginal bleeding this morning after ambulating, with passage of large clots.  Has been NPO since midnight.    Objective: I have reviewed patient's vital signs, intake and output, medications and labs.  Temp:  [97.6 F (36.4 C)-98.9 F (37.2 C)] 98.5 F (36.9 C) (12/20 0819) Pulse Rate:  [82-122] 82 (12/20 0819) Resp:  [16-20] 18 (12/20 0819) BP: (113-148)/(57-81) 125/63 (12/20 0819) SpO2:  [98 %-100 %] 100 % (12/20 0819) Weight:  [175 lb (79.4 kg)] 175 lb (79.4 kg) (12/19 1156)  General: alert, cooperative and no distress Resp: clear to auscultation bilaterally Cardio: regular rate and rhythm, S1, S2 normal, no murmur, click, rub or gallop GI: soft, non-tender; bowel sounds normal; no masses,  no organomegaly Extremities: extremities normal, atraumatic, no cyanosis or edema Vaginal Bleeding: moderate   Labs:   CBC Latest Ref Rng & Units 05/03/2016 05/02/2016 05/02/2016  WBC 3.6 - 11.0 K/uL - 12.8(H) 12.4(H)  Hemoglobin 12.0 - 16.0 g/dL 8.5(L) 7.8(L) 8.7(L)  Hematocrit 35.0 - 47.0 % 24.9(L) 23.8(L) 26.3(L)  Platelets 150 - 440 K/uL - 329 333    Assessment/Plan: Patient to be scheduled for D&C today for management of heavy vaginal bleeding.   Is s/p 1 unit PRBCs for symptomatic anemia.  To continue Aygestin.  Can possibly d/c home today.      LOS: 0 days    Sherry Hobbs 05/03/2016, 8:19 AM

## 2016-05-03 NOTE — Op Note (Signed)
Procedure(s): DILATATION AND CURETTAGE Procedure Note  Sherry Hobbs female 45 y.o. 05/03/2016  Indications: The patient is a 46 y.o. P69 female with abnormal uterine bleeding, symptomatic anemia requiring blood transfusion.    Pre-operative Diagnosis: Abnormal uterine bleeding, symptomatic anemia requiring blood transfusion.   Post-operative Diagnosis: Same  Surgeon: [name], MD  Assistants: None  Anesthesia: General endotracheal anesthesia  Procedure Details: The patient was seen in the Holding Room. The risks, benefits, complications, treatment options, and expected outcomes were discussed with the patient.  The patient concurred with the proposed plan, giving informed consent.  The site of surgery properly noted/marked. The patient was taken to the Operating Room, [facility] and the procedure verified as Procedure(s): DILATATION AND CURETTAGE. A Time Out was held and the above information confirmed.  Patient was brought to the operating room where she was placed under general anesthesia without difficulty.  The patient was placed in dorsal lithotomy position using candy cane stirrups. She was then prepped and draped in a sterile fashion. A uni-valved speculum was placed in the vagina. Single-tooth tenaculum was placed on the anterior cervix. Uterus was sounded to 10 cm.  The cervix was already noted to be ~ 0.5-1 cm dilated. The smooth and serrated curettes were then used to curettage the endometrial cavity with production of lush tissue. The curettage was performed until a gritty texture was noted. The procedure was then terminated with all instrumentation being removed from the vagina. The patient was awakened mobilized and taken to the recovery room in satisfactory condition.  Findings: The uterus was sounded to 10 cm.  Normal appearing cervix (although somewhat large), without visible lesions. Cervix 0.5-1 cm dilated.  Lush amounts of endometrial tissue  noted on curettage.  Moderate amount of blood clots removed from uterus.   Estimated Blood Loss:  75 ml      Drains: straight catheterization prior to procedure with  20 ml of clear urine         Total IV Fluids:  1000 ml  Specimens: Endometrial curettings         Implants: None         Complications:  None; patient tolerated the procedure well.         Disposition: PACU - hemodynamically stable.         Condition: stable   [name], MD [facility]

## 2016-05-03 NOTE — Anesthesia Preprocedure Evaluation (Signed)
Anesthesia Evaluation  Patient identified by MRN, date of birth, ID band Patient awake    Reviewed: Allergy & Precautions, NPO status , Patient's Chart, lab work & pertinent test results  History of Anesthesia Complications Negative for: history of anesthetic complications  Airway Mallampati: II       Dental   Pulmonary neg pulmonary ROS,           Cardiovascular negative cardio ROS       Neuro/Psych Depression negative neurological ROS     GI/Hepatic negative GI ROS, Neg liver ROS,   Endo/Other  diabetes, Type 2, Oral Hypoglycemic Agents  Renal/GU negative Renal ROS     Musculoskeletal   Abdominal   Peds  Hematology  (+) anemia ,   Anesthesia Other Findings   Reproductive/Obstetrics                             Anesthesia Physical Anesthesia Plan  ASA: III  Anesthesia Plan: General   Post-op Pain Management:    Induction: Intravenous  Airway Management Planned: LMA  Additional Equipment:   Intra-op Plan:   Post-operative Plan:   Informed Consent: I have reviewed the patients History and Physical, chart, labs and discussed the procedure including the risks, benefits and alternatives for the proposed anesthesia with the patient or authorized representative who has indicated his/her understanding and acceptance.     Plan Discussed with:   Anesthesia Plan Comments:         Anesthesia Quick Evaluation

## 2016-05-03 NOTE — Anesthesia Procedure Notes (Signed)
Procedure Name: LMA Insertion Date/Time: 05/03/2016 3:55 PM Performed by: Silvana Newness Pre-anesthesia Checklist: Patient identified, Emergency Drugs available, Suction available, Patient being monitored and Timeout performed Patient Re-evaluated:Patient Re-evaluated prior to inductionOxygen Delivery Method: Circle system utilized Preoxygenation: Pre-oxygenation with 100% oxygen Intubation Type: IV induction Ventilation: Mask ventilation without difficulty LMA: LMA inserted LMA Size: 4.0 Number of attempts: 1 Placement Confirmation: positive ETCO2 and breath sounds checked- equal and bilateral Tube secured with: Tape Dental Injury: Teeth and Oropharynx as per pre-operative assessment

## 2016-05-03 NOTE — Progress Notes (Signed)
pt discharged home with family.  Discharge instructions, prescriptions and follow up appointment given to and reviewed with pt.  Pt verbalized understanding, all questions answered.  Escorted by auxiliary. 

## 2016-05-03 NOTE — Transfer of Care (Signed)
Immediate Anesthesia Transfer of Care Note  Patient: Sherry Hobbs  Procedure(s) Performed: Procedure(s): DILATATION AND CURETTAGE  Patient Location: PACU  Anesthesia Type:General  Level of Consciousness: awake, oriented and patient cooperative  Airway & Oxygen Therapy: Patient Spontanous Breathing and Patient connected to face mask oxygen  Post-op Assessment: Report given to RN and Post -op Vital signs reviewed and stable  Post vital signs: Reviewed and stable  Last Vitals:  Vitals:   05/03/16 0819 05/03/16 1227  BP: 125/63 126/65  Pulse: 82 90  Resp: 18 20  Temp: 36.9 C 37.1 C    Last Pain:  Vitals:   05/03/16 1227  TempSrc: Oral  PainSc:          Complications: No apparent anesthesia complications

## 2016-05-03 NOTE — Discharge Instructions (Signed)
Dilation and Curettage or Vacuum Curettage, Care After °This sheet gives you information about how to care for yourself after your procedure. Your health care provider may also give you more specific instructions. If you have problems or questions, contact your health care provider. °What can I expect after the procedure? °After your procedure, it is common to have: °· Mild pain or cramping. °· Some vaginal bleeding or spotting. °These may last for up to 2 weeks after your procedure. °Follow these instructions at home: °Activity  ° °· Do not drive or use heavy machinery while taking prescription pain medicine. °· Avoid driving for the first 24 hours after your procedure. °· Take frequent, short walks, followed by rest periods, throughout the day. Ask your health care provider what activities are safe for you. After 1-2 days, you may be able to return to your normal activities. °· Do not lift anything heavier than 10 lb (4.5 kg) until your health care provider approves. °· For at least 2 weeks, or as long as told by your health care provider, do not: °¨ Douche. °¨ Use tampons. °¨ Have sexual intercourse. °General instructions  ° °· Take over-the-counter and prescription medicines only as told by your health care provider. This is especially important if you take blood thinning medicine. °· Do not take baths, swim, or use a hot tub until your health care provider approves. Take showers instead of baths. °· Wear compression stockings as told by your health care provider. These stockings help to prevent blood clots and reduce swelling in your legs. °· It is your responsibility to get the results of your procedure. Ask your health care provider, or the department performing the procedure, when your results will be ready. °· Keep all follow-up visits as told by your health care provider. This is important. °Contact a health care provider if: °· You have severe cramps that get worse or that do not get better with  medicine. °· You have severe abdominal pain. °· You cannot drink fluids without vomiting. °· You develop pain in a different area of your pelvis. °· You have bad-smelling vaginal discharge. °· You have a rash. °Get help right away if: °· You have vaginal bleeding that soaks more than one sanitary pad in 1 hour, for 2 hours in a row. °· You pass large blood clots from your vagina. °· You have a fever that is above 100.4°F (38.0°C). °· Your abdomen feels very tender or hard. °· You have chest pain. °· You have shortness of breath. °· You cough up blood. °· You feel dizzy or light-headed. °· You faint. °· You have pain in your neck or shoulder area. °This information is not intended to replace advice given to you by your health care provider. Make sure you discuss any questions you have with your health care provider. °Document Released: 04/28/2000 Document Revised: 12/29/2015 Document Reviewed: 12/02/2015 °Elsevier Interactive Patient Education © 2017 [employer] ° °

## 2016-05-04 ENCOUNTER — Encounter: Payer: Self-pay | Admitting: Obstetrics and Gynecology

## 2016-05-04 LAB — GLUCOSE, CAPILLARY: Glucose-Capillary: 105 mg/dL — ABNORMAL HIGH (ref 65–99)

## 2016-05-05 LAB — SURGICAL PATHOLOGY

## 2016-05-18 ENCOUNTER — Encounter: Payer: Self-pay | Admitting: Obstetrics and Gynecology

## 2016-05-18 ENCOUNTER — Other Ambulatory Visit: Payer: Self-pay | Admitting: Obstetrics and Gynecology

## 2016-05-18 ENCOUNTER — Ambulatory Visit (INDEPENDENT_AMBULATORY_CARE_PROVIDER_SITE_OTHER): Payer: BLUE CROSS/BLUE SHIELD | Admitting: Obstetrics and Gynecology

## 2016-05-18 VITALS — BP 144/72 | HR 86 | Ht 62.0 in | Wt 190.8 lb

## 2016-05-18 DIAGNOSIS — Z9889 Other specified postprocedural states: Secondary | ICD-10-CM

## 2016-05-18 DIAGNOSIS — Z9289 Personal history of other medical treatment: Secondary | ICD-10-CM | POA: Diagnosis not present

## 2016-05-18 DIAGNOSIS — Z3141 Encounter for fertility testing: Secondary | ICD-10-CM | POA: Diagnosis not present

## 2016-05-18 DIAGNOSIS — D500 Iron deficiency anemia secondary to blood loss (chronic): Secondary | ICD-10-CM | POA: Diagnosis not present

## 2016-05-18 DIAGNOSIS — N939 Abnormal uterine and vaginal bleeding, unspecified: Secondary | ICD-10-CM | POA: Diagnosis not present

## 2016-05-18 DIAGNOSIS — R030 Elevated blood-pressure reading, without diagnosis of hypertension: Secondary | ICD-10-CM | POA: Diagnosis not present

## 2016-05-18 MED ORDER — NORETHINDRONE ACETATE 5 MG PO TABS: 5.0000 mg | ORAL_TABLET | Freq: Every day | ORAL | 1 refills | Status: DC

## 2016-05-18 MED ORDER — ESTRADIOL 1 MG PO TABS: 1.0000 mg | ORAL_TABLET | Freq: Every day | ORAL | 11 refills | Status: DC

## 2016-05-18 NOTE — Progress Notes (Signed)
GYNECOLOGY PROGRESS NOTE  Subjective:    Patient ID: Sherry Hobbs, female    DOB: 07/13/1970, 46 y.o.   MRN: [id number]  HPI  Patient is a 46 y.o. G59P1001 female who presents for f/u from hospital admission x 2 for symptomatic anemia secondary to abnormal uterine bleeding, requiring blood transfusions each visit.  On second admission, patient underwent D&C for abnormal uterine bleeding. Patient today notes that bleeding has significantly decreased.  The bleeding varies from spotting to light period.  Notes that she feels much better.  Denies cramping.   Is currently taking Aygestin as prescribed (was discontinued from combined OCPs on admission)   Past Medical History:  Diagnosis Date  . Depression   . History of blood transfusion 05/02/2016    Family History  Problem Relation Age of Onset  . Diabetes Mother   . COPD Mother   . Emphysema Mother   . Osteoporosis Mother   . CAD Father   . Diabetes Sister   . Diabetes Brother   . CAD Brother   . Renal cancer Maternal Uncle     1 uncle  . Brain cancer Maternal Uncle     different uncle  . Diabetes Maternal Grandmother   . Stroke Maternal Grandmother   . Bladder Cancer Maternal Grandfather     Past Surgical History:  Procedure Laterality Date  . DILATION AND CURETTAGE OF UTERUS  05/03/2016   Procedure: DILATATION AND CURETTAGE;  Surgeon: [name], MD;  Location: [facility] ORS;  Service: Gynecology;;    Social History   Social History  . Marital status: Married    Spouse name: N/A  . Number of children: N/A  . Years of education: N/A   Occupational History  . Not on file.   Social History Main Topics  . Smoking status: Never Smoker  . Smokeless tobacco: Never Used  . Alcohol use No  . Drug use: No  . Sexual activity: Not Currently    Birth control/ protection: Pill   Other Topics Concern  . Not on file   Social History Narrative  . No narrative on file    Current Outpatient Prescriptions on File Prior  to Visit  Medication Sig Dispense Refill  . citalopram (CELEXA) 40 MG tablet Take 40 mg by mouth daily.    Marland Kitchen docusate sodium (COLACE) 100 MG capsule Take 1 capsule (100 mg total) by mouth daily as needed for mild constipation. 30 capsule 2  . ferrous sulfate (FERROUSUL) 325 (65 FE) MG tablet Take 1 tablet (325 mg total) by mouth 3 (three) times daily with meals. 90 tablet 2  . HYDROcodone-acetaminophen (NORCO/VICODIN) 5-325 MG tablet Take 1 tablet by mouth every 6 (six) hours as needed. 20 tablet 0  . loratadine (CLARITIN) 10 MG tablet Take 10 mg by mouth daily.    . metFORMIN (GLUCOPHAGE) 500 MG tablet Take 2 tablets by mouth daily.  10   No current facility-administered medications on file prior to visit.     Allergies  Allergen Reactions  . Codeine Nausea And Vomiting    Review of Systems Pertinent items noted in HPI and remainder of comprehensive ROS otherwise negative.   Objective:   Blood pressure (!) 144/72, pulse 86, height 5\' 2"  (1.575 m), weight 190 lb 12.8 oz (86.5 kg). General appearance: alert and no distress Lungs: clear to auscultation bilaterally Heart: regular rate and rhythm, S1, S2 normal, no murmur, click, rub or gallop  Abdomen: soft, non-tender; bowel sounds normal; no masses,  no organomegaly Pelvic: deferred  Extremities: extremities normal, atraumatic, no cyanosis or edema Neurologic: Grossly normal    Labs:  CBC Latest Ref Rng & Units 05/03/2016 05/02/2016 05/02/2016  WBC 3.6 - 11.0 K/uL - 12.8(H) 12.4(H)  Hemoglobin 12.0 - 16.0 g/dL 8.5(L) 7.8(L) 8.7(L)  Hematocrit 35.0 - 47.0 % 24.9(L) 23.8(L) 26.3(L)  Platelets 150 - 440 K/uL - 329 333    Lab Results  Component Value Date   TSH 3.717 03/30/2016    Pathology (05/03/16):  DIAGNOSIS:  A. ENDOMETRIUM; CURETTAGE:  - PROGESTIN EFFECT WITH BREAKDOWN AND BLEEDING.  - SCANT ENDOCERVICAL TISSUE.  - NEGATIVE FOR HYPERPLASIA, ATYPIA, AND MALIGNANCY.   Assessment:   Abnormal uterine bleeding S/p  dilation and curettage H/o symptomatic anemia H/o blood transfusion Elevated BP  Plan:   - Discussed pathology results with patient. Negativ efor atypia or malignancy.  - Discussed management options again with patient.  Patient notes that she is still on the fence regarding more definitive management with surgery (ablation, hysterectomy), as she is still considering conceiving once more.  Desires to know if it is still possible to become pregnant.  Discussed that based on patient's age, it may be more difficult to become pregnant spontaneously.  Can order fertility labs to assess ovarian reserve.  If patient is still able to conceive, would recommend referral to an REI specialist.  If patient has decreased reserve, notes that she would be willing to proceed with endometrial ablation.  Currently on Aygestin for abnormal bleeding, will also add Estradiol 1 mg.  - Will need to recheck Hgb in 3-4 weeks.  Patient notes that she is taking iron tablets TID as prescribed.   - Will schedule f/u based on patient's lab results.  Elevated BP - patient denies h/o HTN.  Will continue to monitor.     [name], MD [facility]

## 2016-05-21 LAB — ANTI MULLERIAN HORMONE: ANTI-MULLERIAN HORMONE (AMH): 0.699 ng/mL

## 2016-05-21 LAB — FSH/LH
FSH: 13.9 m[IU]/mL
LH: 2.8 m[IU]/mL

## 2016-05-21 LAB — ESTRADIOL: Estradiol: 20.7 pg/mL

## 2016-05-21 LAB — PROGESTERONE: Progesterone: <0.1 ng/mL

## 2016-05-24 ENCOUNTER — Telehealth: Payer: Self-pay

## 2016-05-24 NOTE — Telephone Encounter (Signed)
-----   Message from [name], MD sent at 05/23/2016  9:28 PM EST ----- Please inform patient all of her hormone levels returned normal, except her progesterone levels appear to be on the low side (almost menopausal levels). She will likely need progesterone supplementation if she attempts to get pregnant.  She will need to discontinue the current regimen of Aygestin, and once she is ready to conceive we will prescribe a different one for supplementation (such as a vaginal suppository daily during last 14 days of th month).

## 2016-05-24 NOTE — Telephone Encounter (Signed)
Called pt no answer. LM for pt informing her of the information below. Pt to call back for any more questions or concerns.

## 2016-05-29 ENCOUNTER — Telehealth: Payer: Self-pay | Admitting: Obstetrics and Gynecology

## 2016-05-29 NOTE — Telephone Encounter (Signed)
Called pt no answer. LM for pt to call back.  

## 2016-05-29 NOTE — Telephone Encounter (Signed)
THIS PT CALLED YOU BACK.

## 2017-09-11 ENCOUNTER — Encounter: Payer: Self-pay | Admitting: Obstetrics and Gynecology

## 2017-09-11 ENCOUNTER — Ambulatory Visit (INDEPENDENT_AMBULATORY_CARE_PROVIDER_SITE_OTHER): Payer: BLUE CROSS/BLUE SHIELD | Admitting: Obstetrics and Gynecology

## 2017-09-11 VITALS — BP 124/87 | HR 84 | Ht 62.0 in | Wt 197.6 lb

## 2017-09-11 DIAGNOSIS — N924 Excessive bleeding in the premenopausal period: Secondary | ICD-10-CM

## 2017-09-11 MED ORDER — NORETHINDRONE ACETATE 5 MG PO TABS: 5.0000 mg | ORAL_TABLET | Freq: Every day | ORAL | 2 refills | Status: DC

## 2017-09-11 NOTE — Progress Notes (Signed)
Pt went for 6 months with cycle beginning  regular after d and c, but lately her period is irregular with clots palm size. With some cramping.

## 2017-09-11 NOTE — Progress Notes (Signed)
    GYNECOLOGY PROGRESS NOTE  Subjective:    Patient ID: Sherry Hobbs, female    DOB: 18-Oct-1970, 47 y.o.   MRN: [id number]  HPI  Patient is a 47 y.o. G52P1001 female who presents for complaints of dysfunctional uterine bleeding.  Patient reports of abnormal menstrual cycles for the past 5-6 months.  Notes that with onset of cycles she passes large clots (some palm-sized), with some associated cramping.  Cycles last ~ 4-5 days, then goes off for ~ 1-2 weeks, and then returns.  She had a similar episode like this in 2017, and underwent negative workup at that time. She was prescribed Aygestin at that time, but took for ~ 3 months and periods returned to normal for approximately 1 year. She denies chest pain, SOB, but does report feeling sluggish lately, and is retaining fluid.   The following portions of the patient's history were reviewed and updated as appropriate: allergies, current medications, past family history, past medical history, past social history, past surgical history and problem list.  Review of Systems Pertinent items noted in HPI and remainder of comprehensive ROS otherwise negative.   Objective:   Blood pressure 124/87, pulse 84, height 5\' 2"  (1.575 m), weight 197 lb 9.6 oz (89.6 kg). General appearance: alert and no distress Remainder of exam deferred today.    Assessment:   Dysfunctional uterine bleeding Perimenopausal  Plan:   - Patient has dysfunctional uterine bleeding . She has a prior h/o normal exam and ultrasound. Has had normal TSH within the past few years with no other symptoms. Patient still likely in perimenopausal phase as she responded well to Aygestin.  Will resume treatment and f/u in 3 months. Can discuss further management options at that time as patient notes that she has begun to consider hysterectomy.  If no response to therapy, will repeat workup (including ultrasound and endometrial biopsy).  Hgb ordered to assess hemodynamic status. Has h/o  anemia in the past.  Will contact patient with these results and plans for further evaluation/management.   A total of 15 minutes were spent face-to-face with the patient during this encounter and over half of that time dealt with counseling and coordination of care.   [name], MD [facility]

## 2017-09-12 ENCOUNTER — Encounter: Payer: Self-pay | Admitting: Obstetrics and Gynecology

## 2017-09-12 LAB — CBC
Hematocrit: 28 % — ABNORMAL LOW (ref 34.0–46.6)
Hemoglobin: 9.1 g/dL — ABNORMAL LOW (ref 11.1–15.9)
MCH: 27.4 pg (ref 26.6–33.0)
MCHC: 32.5 g/dL (ref 31.5–35.7)
MCV: 84 fL (ref 79–97)
Platelets: 277 10*3/uL (ref 150–379)
RBC: 3.32 x10E6/uL — AB (ref 3.77–5.28)
RDW: 13.8 % (ref 12.3–15.4)
WBC: 10.4 10*3/uL (ref 3.4–10.8)

## 2017-09-14 ENCOUNTER — Other Ambulatory Visit: Payer: Self-pay

## 2017-09-14 MED ORDER — DOCUSATE SODIUM 100 MG PO CAPS: 100.0000 mg | ORAL_CAPSULE | Freq: Two times a day (BID) | ORAL | 6 refills | Status: DC

## 2017-10-03 ENCOUNTER — Other Ambulatory Visit: Payer: Self-pay

## 2017-10-03 MED ORDER — IBUPROFEN 600 MG PO TABS: 600.0000 mg | ORAL_TABLET | Freq: Four times a day (QID) | ORAL | 1 refills | Status: DC | PRN

## 2017-10-12 ENCOUNTER — Encounter: Payer: Self-pay | Admitting: Obstetrics and Gynecology

## 2017-10-15 ENCOUNTER — Encounter: Payer: Self-pay | Admitting: Obstetrics and Gynecology

## 2017-10-27 ENCOUNTER — Encounter: Payer: Self-pay | Admitting: Obstetrics and Gynecology

## 2017-10-29 ENCOUNTER — Encounter: Payer: Self-pay | Admitting: Obstetrics and Gynecology

## 2017-11-01 ENCOUNTER — Other Ambulatory Visit: Payer: Self-pay

## 2017-11-01 MED ORDER — NORETHINDRONE ACETATE 5 MG PO TABS: 5.0000 mg | ORAL_TABLET | Freq: Every day | ORAL | 2 refills | Status: DC

## 2017-11-08 ENCOUNTER — Encounter: Payer: Self-pay | Admitting: Obstetrics and Gynecology

## 2017-11-09 ENCOUNTER — Encounter: Payer: Self-pay | Admitting: Obstetrics and Gynecology

## 2017-11-13 ENCOUNTER — Other Ambulatory Visit (INDEPENDENT_AMBULATORY_CARE_PROVIDER_SITE_OTHER): Payer: BLUE CROSS/BLUE SHIELD

## 2017-11-13 ENCOUNTER — Other Ambulatory Visit (HOSPITAL_COMMUNITY)
Admission: RE | Admit: 2017-11-13 | Discharge: 2017-11-13 | Disposition: A | Discharge status: Home / Self Care | Payer: BLUE CROSS/BLUE SHIELD | Source: Ambulatory Visit | Attending: Obstetrics and Gynecology | Admitting: Obstetrics and Gynecology

## 2017-11-13 ENCOUNTER — Encounter: Payer: Self-pay | Admitting: Obstetrics and Gynecology

## 2017-11-13 ENCOUNTER — Ambulatory Visit (INDEPENDENT_AMBULATORY_CARE_PROVIDER_SITE_OTHER): Payer: BLUE CROSS/BLUE SHIELD | Admitting: Obstetrics and Gynecology

## 2017-11-13 VITALS — BP 151/87 | HR 72 | Ht 62.0 in | Wt 194.8 lb

## 2017-11-13 DIAGNOSIS — N938 Other specified abnormal uterine and vaginal bleeding: Secondary | ICD-10-CM

## 2017-11-13 DIAGNOSIS — R9389 Abnormal findings on diagnostic imaging of other specified body structures: Secondary | ICD-10-CM

## 2017-11-13 DIAGNOSIS — N852 Hypertrophy of uterus: Secondary | ICD-10-CM | POA: Diagnosis not present

## 2017-11-13 DIAGNOSIS — I10 Essential (primary) hypertension: Secondary | ICD-10-CM

## 2017-11-13 DIAGNOSIS — D500 Iron deficiency anemia secondary to blood loss (chronic): Secondary | ICD-10-CM | POA: Diagnosis not present

## 2017-11-13 DIAGNOSIS — N8301 Follicular cyst of right ovary: Secondary | ICD-10-CM

## 2017-11-13 MED ORDER — FERROUS SULFATE 325 (65 FE) MG PO TABS: 325.0000 mg | ORAL_TABLET | Freq: Two times a day (BID) | ORAL | 1 refills | Status: DC

## 2017-11-13 MED ORDER — NORGESTREL-ETHINYL ESTRADIOL 0.3-30 MG-MCG PO TABS
ORAL_TABLET | ORAL | 0 refills | Status: DC
Start: 2017-11-13 — End: 2017-12-03

## 2017-11-13 NOTE — Addendum Note (Signed)
Addended by: Edwyna Shell on: 11/13/2017 11:11 AM   Modules accepted: Orders

## 2017-11-13 NOTE — Patient Instructions (Signed)
Endometrial Ablation Endometrial ablation is a procedure that destroys the thin inner layer of the lining of the uterus (endometrium). This procedure may be done:  To stop heavy periods.  To stop bleeding that is causing anemia.  To control irregular bleeding.  To treat bleeding caused by small tumors (fibroids) in the endometrium.  This procedure is often an alternative to major surgery, such as removal of the uterus and cervix (hysterectomy). As a result of this procedure:  You may not be able to have children. However, if you are premenopausal (you have not gone through menopause): ? You may still have a small chance of getting pregnant. ? You will need to use a reliable method of birth control after the procedure to prevent pregnancy.  You may stop having a menstrual period, or you may have only a small amount of bleeding during your period. Menstruation may return several years after the procedure.  Tell a health care provider about:  Any allergies you have.  All medicines you are taking, including vitamins, herbs, eye drops, creams, and over-the-counter medicines.  Any problems you or family members have had with the use of anesthetic medicines.  Any blood disorders you have.  Any surgeries you have had.  Any medical conditions you have. What are the risks? Generally, this is a safe procedure. However, problems may occur, including:  A hole (perforation) in the uterus or bowel.  Infection of the uterus, bladder, or vagina.  Bleeding.  Damage to other structures or organs.  An air bubble in the lung (air embolus).  Problems with pregnancy after the procedure.  Failure of the procedure.  Decreased ability to diagnose cancer in the endometrium.  What happens before the procedure?  You will have tests of your endometrium to make sure there are no pre-cancerous cells or cancer cells present.  You may have an ultrasound of the uterus.  You may be given  medicines to thin the endometrium.  Ask your health care provider about: ? Changing or stopping your regular medicines. This is especially important if you take diabetes medicines or blood thinners. ? Taking medicines such as aspirin and ibuprofen. These medicines can thin your blood. Do not take these medicines before your procedure if your doctor tells you not to.  Plan to have someone take you home from the hospital or clinic. What happens during the procedure?  You will lie on an exam table with your feet and legs supported as in a pelvic exam.  To lower your risk of infection: ? Your health care team will wash or sanitize their hands and put on germ-free (sterile) gloves. ? Your genital area will be washed with soap.  An IV tube will be inserted into one of your veins.  You will be given a medicine to help you relax (sedative).  A surgical instrument with a light and camera (resectoscope) will be inserted into your vagina and moved into your uterus. This allows your surgeon to see inside your uterus.  Endometrial tissue will be removed using one of the following methods: ? Radiofrequency. This method uses a radiofrequency-alternating electric current to remove the endometrium. ? Cryotherapy. This method uses extreme cold to freeze the endometrium. ? Heated-free liquid. This method uses a heated saltwater (saline) solution to remove the endometrium. ? Microwave. This method uses high-energy microwaves to heat up the endometrium and remove it. ? Thermal balloon. This method involves inserting a catheter with a balloon tip into the uterus. The balloon tip is   filled with heated fluid to remove the endometrium. The procedure may vary among health care providers and hospitals. What happens after the procedure?  Your blood pressure, heart rate, breathing rate, and blood oxygen level will be monitored until the medicines you were given have worn off.  As tissue healing occurs, you may  notice vaginal bleeding for 4-6 weeks after the procedure. You may also experience: ? Cramps. ? Thin, watery vaginal discharge that is light pink or brown in color. ? A need to urinate more frequently than usual. ? Nausea.  Do not drive for 24 hours if you were given a sedative.  Do not have sex or insert anything into your vagina until your health care provider approves. Summary  Endometrial ablation is done to treat the many causes of heavy menstrual bleeding.  The procedure may be done only after medications have been tried to control the bleeding.  Plan to have someone take you home from the hospital or clinic. This information is not intended to replace advice given to you by your health care provider. Make sure you discuss any questions you have with your health care provider. Document Released: 03/10/2004 Document Revised: 05/18/2016 Document Reviewed: 05/18/2016 Elsevier Interactive Patient Education  2017 [employer].           Hysterectomy Information A hysterectomy is a surgery to remove your uterus. After surgery, you will no longer have periods. Also, you will not be able to get pregnant. Reasons for this surgery  You have bleeding that is not normal and keeps coming back.  You have lasting (chronic) lower belly (pelvic) pain.  You have a lasting infection.  The lining of your uterus grows outside your uterus.  The lining of your uterus grows in the muscle of your uterus.  Your uterus falls down into your vagina.  You have a growth in your uterus that causes problems.  You have cells that could turn into cancer (precancerous cells).  You have cancer of the uterus or cervix. Types There are 3 types of hysterectomies. Depending on the type, the surgery will:  Remove the top part of the uterus only.  Remove the uterus and the cervix.  Remove the uterus, cervix, and tissue that holds the uterus in place in the lower belly.  Ways a hysterectomy can  be performed There are 5 ways this surgery can be performed.  A cut (incision) is made in the belly (abdomen). The uterus is taken out through the cut.  A cut is made in the vagina. The uterus is taken out through the cut.  Three or four cuts are made in the belly. A surgical device with a camera is put through one of the cuts. The uterus is cut into small pieces. The uterus is taken out through the cuts or the vagina.  Three or four cuts are made in the belly. A surgical device with a camera is put through one of the cuts. The uterus is taken out through the vagina.  Three or four cuts are made in the belly. A surgical device that is controlled by a computer makes a visual image. The device helps the surgeon control the surgical tools. The uterus is cut into small pieces. The pieces are taken out through the cuts or through the vagina.  What can I expect after the surgery?  You will be given pain medicine.  You will need help at home for 3-5 days after surgery.  You will need to see your  doctor in 2-4 weeks after surgery.  You may get hot flashes, have night sweats, and have trouble sleeping.  You may need to have Pap tests in the future if your surgery was related to cancer. Talk to your doctor. It is still good to have regular exams. This information is not intended to replace advice given to you by your health care provider. Make sure you discuss any questions you have with your health care provider. Document Released: 07/24/2011 Document Revised: 10/07/2015 Document Reviewed: 01/06/2013 Elsevier Interactive Patient Education  [employer].

## 2017-11-13 NOTE — Progress Notes (Signed)
Pt stated that she is having heavy bleeding continually with clots and cramps.  Pt stated changing pads every 30 minutes.

## 2017-11-13 NOTE — Progress Notes (Signed)
GYNECOLOGY PROGRESS NOTE  Subjective:    Patient ID: Sherry Hobbs, female    DOB: June 29, 1970, 47 y.o.   MRN: [id number]  HPI  Patient is a 47 y.o. G59P1001 female who presents for follow up of dysfunctional uterine bleeding with passage of clots and cramping.  She was started on Aygestin in May.  Notes that since that time her bleeding has continued.  Was even increased to 10 mg dosing, which did not help her bleeding. States that she is worried that something else may be going on as she had responded well to Aygestin in the past.  Also desires to discuss her options as she is considering a hysterectomy. She denies h/o SOB, chest pain, fatigue.  She does have a history of anemia (last Hgb was 9.1 in April)  The following portions of the patient's history were reviewed and updated as appropriate: allergies, current medications, past family history, past medical history, past social history, past surgical history and problem list.  Review of Systems Pertinent items noted in HPI and remainder of comprehensive ROS otherwise negative.   Objective:   Blood pressure (!) 151/87, pulse 72, height 5\' 2"  (1.575 m), weight 194 lb 12.8 oz (88.4 kg). General appearance: alert and no distress Abdomen: soft, non-tender; bowel sounds normal; no masses,  no organomegaly Pelvic: external genitalia normal, rectovaginal septum normal.  Vagina with small to moderate amount of dark red blood in vault. No clots present. discharge.  Cervix normal appearing, no lesions and no motion tenderness.  Uterus mobile, nontender, normal shape and size.  Adnexae non-palpable, nontender bilaterally.  Extremities: extremities normal, atraumatic, no cyanosis or edema Neurologic: Grossly normal   Assessment:   DUB H/o anemia HTN  Plan:   1. DUB - Patient has dysfunctional uterine bleeding (with history since 2017) . She has a normal exam, no evidence of lesions.  Has had a normal TSH within the past 2 years and normal  pelvic ultrasound to evaluate for any structural gynecologic abnormalities.  Will repeat ultrasound today.  Endometrial biopsy performed today (see procedure note below). Also discussed management options for abnormal uterine bleeding again, including tranexamic acid (Lysteda), oral progesterone (Megace), Depo Provera, Levonogestrel IUD, endometrial ablation or hysterectomy as definitive surgical management.  Discussed risks and benefits of each method.   Patient leaning between surgical management with endometrial ablation vs hysterectomy.  Printed patient education handouts were given to the patient to review at home.  bleeding precautions reviewed. Patient will call back later this week once biopsy resulted to decide on her management option.   2. Patient with h/o anemia and continued DUB. Will repeat H/H today. Currently asymptomatic. Was instructed on OTC iron therapy daily last visit in May, but will order BID iron supplementation. Has prescription for stool softeners.   3. HTN - patient notes that she was recently started on Lisinopril for management of her BPs.  Was also started on metformin for pre-diabetes. As patient currently beginning medical therapy for her BPs, discussed risk of use of short term OCPs with HTN over age 75, however she has not been responsive to progesterone-only method recently.    Endometrial Biopsy Procedure Note  The patient is positioned on the exam table in the dorsal lithotomy position. Bimanual exam confirms uterine position and size. A Graves speculum is placed into the vagina. A single toothed tenaculum is placed onto the anterior lip of the cervix. The pipette is placed into the endocervical canal and is advanced to the uterine  fundus. Using a piston like technique, with vacuum created by withdrawing the stylus, the endometrial specimen is obtained and transferred to the biopsy container. Minimal bleeding is encountered. The procedure is well tolerated.   Uterine  Position:mid    Uterine Length: 6 cm   Uterine Specimen: Lush  Post procedure instructions are given. The patient will be notified of results by phone.     [name], MD [facility]

## 2017-11-14 LAB — HEMOGLOBIN AND HEMATOCRIT, BLOOD
Hematocrit: 34.5 % (ref 34.0–46.6)
Hemoglobin: 11.3 g/dL (ref 11.1–15.9)

## 2017-11-19 ENCOUNTER — Encounter: Payer: Self-pay | Admitting: Obstetrics and Gynecology

## 2017-11-20 ENCOUNTER — Encounter: Payer: Self-pay | Admitting: Obstetrics and Gynecology

## 2017-11-20 ENCOUNTER — Ambulatory Visit (INDEPENDENT_AMBULATORY_CARE_PROVIDER_SITE_OTHER): Payer: BLUE CROSS/BLUE SHIELD | Admitting: Obstetrics and Gynecology

## 2017-11-20 VITALS — BP 111/72 | HR 90 | Ht 62.0 in | Wt 195.0 lb

## 2017-11-20 DIAGNOSIS — N938 Other specified abnormal uterine and vaginal bleeding: Secondary | ICD-10-CM | POA: Diagnosis not present

## 2017-11-20 NOTE — Progress Notes (Signed)
    GYNECOLOGY PROGRESS NOTE  Subjective:    Patient ID: Sherry Hobbs, female    DOB: 1971-03-06, 47 y.o.   MRN: [id number]  HPI  Patient is a 47 y.o. G32P1001 female who presents for f/u of results (endometrial biopsy) and ultrasound for abnormal uterine bleeding.  Patient notes that after starting her OCP taper, the first few days her bleeding stopped, however after decreasing the dose, the bleeding started back.  She notes that the bleeding is lighter, however is still daily.   The following portions of the patient's history were reviewed and updated as appropriate: allergies, current medications, past family history, past medical history, past social history, past surgical history and problem list.  Review of Systems Pertinent items noted in HPI and remainder of comprehensive ROS otherwise negative.   Objective:   Blood pressure 111/72, pulse 90, height 5\' 2"  (1.575 m), weight 195 lb (88.5 kg). General appearance: alert and no distress Remainder of exam deferred.    Labs:  Lab Results  Component Value Date   HCT 34.5 11/13/2017    Pathology:  Endometrium, biopsy - BENIGN SECRETORY-TYPE ENDOMETRIUM WITH DECIDUALIZATION OF THE STROMA, CONSISTENT WITH HORMONE EFFECT. - BENIGN ENDOCERVICAL-TYPE MUCOSA. - THERE IS NO EVIDENCE OF HYPERPLASIA OR MALIGNANCY.   Imaging:  US Transvaginal Non-OB ULTRASOUND REPORT  Location: [facility] Date of Service:  11/13/2017  Indications: DUB Findings:  The uterus measures 11.9 x 7.0 x 6.5 cm. Echo texture is homogeneous without evidence of focal masses. The Endometrium measures 13.0 mm.  Right Ovary measures 3.5 x 2.3 x 2.1 cm and contains 2 dominant follicles  measuring 2.3 x 1.5 x 1.6 cm and 1.7 x 1.0 x 1.1 cm. It is normal in  appearance. Left Ovary measures 2.9 x 2.4 x 1.3 cm (seen transabdominally only). It is  normal appearance. Survey of the adnexa demonstrates no adnexal masses. There is no free fluid in the cul  de sac.  Impression: 1. Anteverted uterus appears enlarged at 11.9 x 7.0 x 6.5 cm. 2. The endometrium appears thickened at 13.0 mm. 3. Bilateral ovaries appear WNL.  Right ovary contains 2 dominant  follicles.  Recommendations: 1.Clinical correlation with the patient's History and Physical Exam.  Dario Ave, RDMS  I have reviewed this study and agree with documented findings.   [name], MD [facility]   Assessment:   Dysfunctional uterine bleeding  Plan:   Discussion had again regarding results of biopsy and ultrasound.  Also discussed management options.  Patient now desires to proceed with endometrial ablation. Previously discussed procedure last visit. Risks/benefits discussed today. Will schedule for 12/03/2017 for Hysteroscopy D&C with Minerva ablation.  Pre-op done today.  No evidence of anemia based on last labs.  Patient will continue OCP taper until surgery date.   [name], MD [facility]

## 2017-11-20 NOTE — H&P (View-Only) (Signed)
@[facility] @   GYNECOLOGY PREOPERATIVE HISTORY AND PHYSICAL   Subjective:  Sherry Hobbs is a 47 y.o. G1P1001 here for surgical management of dysfunctional uterine bleeding. No significant preoperative concerns.  Proposed surgery: Hysteroscopy D&C with Minerva ablation    Pertinent Gynecological History: Menses: flow is moderate and irregular daily bleeding for the past 2-3 months Bleeding: dysfunctional uterine bleeding Contraception: none Last pap: normal Date: 01/2016   Past Medical History:  Diagnosis Date  . Depression   . History of blood transfusion 05/02/2016  . Hypertension    Past Surgical History:  Procedure Laterality Date  . DILATION AND CURETTAGE OF UTERUS  05/03/2016   Procedure: DILATATION AND CURETTAGE;  Surgeon: [name], MD;  Location: [facility] ORS;  Service: Gynecology;;   OB History  Gravida Sherry Term Preterm AB Living  1 1 1     1   SAB TAB Ectopic Multiple Live Births          1    # Outcome Date GA Lbr Len/2nd Weight Sex Delivery Anes PTL Lv  1 Term 10/1997 [redacted]w[redacted]d  6 lb 4 oz (2.835 kg) M Vag-Spont   LIV  Patient denies any other pertinent gynecologic issues.  Family History  Problem Relation Age of Onset  . Diabetes Mother   . COPD Mother   . Emphysema Mother   . Osteoporosis Mother   . CAD Father   . Diabetes Sister   . Diabetes Brother   . CAD Brother   . Renal cancer Maternal Uncle        1 uncle  . Brain cancer Maternal Uncle        different uncle  . Diabetes Maternal Grandmother   . Stroke Maternal Grandmother   . Bladder Cancer Maternal Grandfather    Social History   Socioeconomic History  . Marital status: Married    Spouse name: Not on file  . Number of children: Not on file  . Years of education: Not on file  . Highest education level: Not on file  Occupational History  . Not on file  Social Needs  . Financial resource strain: Not on file  . Food insecurity:    Worry: Not on file    Inability: Not on file  .  Transportation needs:    Medical: Not on file    Non-medical: Not on file  Tobacco Use  . Smoking status: Never Smoker  . Smokeless tobacco: Never Used  Substance and Sexual Activity  . Alcohol use: No  . Drug use: No  . Sexual activity: Yes    Birth control/protection: None, Pill  Lifestyle  . Physical activity:    Days per week: Not on file    Minutes per session: Not on file  . Stress: Not on file  Relationships  . Social connections:    Talks on phone: Not on file    Gets together: Not on file    Attends religious service: Not on file    Active member of club or organization: Not on file    Attends meetings of clubs or organizations: Not on file    Relationship status: Not on file  . Intimate partner violence:    Fear of current or ex partner: Not on file    Emotionally abused: Not on file    Physically abused: Not on file    Forced sexual activity: Not on file  Other Topics Concern  . Not on file  Social History Narrative  . Not on file  Current Outpatient Medications on File Prior to Visit  Medication Sig Dispense Refill  . docusate sodium (COLACE) 100 MG capsule Take 1 capsule (100 mg total) by mouth 2 (two) times daily. 60 capsule 6  . ferrous sulfate (FERROUSUL) 325 (65 FE) MG tablet Take 1 tablet (325 mg total) by mouth 2 (two) times daily. 60 tablet 1  . ibuprofen (ADVIL,MOTRIN) 600 MG tablet Take 1 tablet (600 mg total) by mouth every 6 (six) hours as needed. 30 tablet 1  . lisinopril (PRINIVIL,ZESTRIL) 10 MG tablet Take 10 mg by mouth daily.    . norgestrel-ethinyl estradiol (CRYSELLE-28) 0.3-30 MG-MCG tablet 1 pill 4 x daily for 4 days,  then1 pill 3 x daily for 3 days, 1 pill 2 x daily for 2 days, then 1 pill daily. Use active pills only. 3 Package 0   No current facility-administered medications on file prior to visit.    Allergies  Allergen Reactions  . Codeine Nausea And Vomiting      Review of Systems Constitutional: No recent  fever/chills/sweats Respiratory: No recent cough/bronchitis Cardiovascular: No chest pain Gastrointestinal: No recent nausea/vomiting/diarrhea Genitourinary: No UTI symptoms. Positive for persistent moderate to heavy vaginal bleeding Hematologic/lymphatic:No history of coagulopathy or recent blood thinner use    Objective:   Blood pressure 111/72, pulse 90, height 5\' 2"  (1.575 m), weight 195 lb (88.5 kg). CONSTITUTIONAL: Well-developed, well-nourished female in no acute distress.  HENT:  Normocephalic, atraumatic, External right and left ear normal. Oropharynx is clear and moist EYES: Conjunctivae and EOM are normal. Pupils are equal, round, and reactive to light. No scleral icterus.  NECK: Normal range of motion, supple, no masses SKIN: Skin is warm and dry. No rash noted. Not diaphoretic. No erythema. No pallor. NEUROLOGIC: Alert and oriented to person, place, and time. Normal reflexes, muscle tone coordination. No cranial nerve deficit noted. PSYCHIATRIC: Normal mood and affect. Normal behavior. Normal judgment and thought content. CARDIOVASCULAR: Normal heart rate noted, regular rhythm RESPIRATORY: Effort and breath sounds normal, no problems with respiration noted ABDOMEN: Soft, nontender, nondistended. PELVIC: Deferred MUSCULOSKELETAL: Normal range of motion. No edema and no tenderness. 2+ distal pulses.    Labs: Results for orders placed or performed in visit on 11/13/17 (from the past 336 hour(s))  Hemoglobin and hematocrit, blood   Collection Time: 11/13/17 10:02 AM  Result Value Ref Range   Hemoglobin 11.3 11.1 - 15.9 g/dL   Hematocrit 34.5 34.0 - 46.6 %    Pathology:  Endometrium, biopsy - BENIGN SECRETORY-TYPE ENDOMETRIUM WITH DECIDUALIZATION OF THE STROMA, CONSISTENT WITH HORMONE EFFECT. - BENIGN ENDOCERVICAL-TYPE MUCOSA. - THERE IS NO EVIDENCE OF HYPERPLASIA OR MALIGNANCY.   Imaging Studies: US Transvaginal Non-ob  Result Date: 11/14/2017 ULTRASOUND REPORT  Location: [facility] Date of Service:  11/13/2017 Indications: DUB Findings: The uterus measures 11.9 x 7.0 x 6.5 cm. Echo texture is homogeneous without evidence of focal masses. The Endometrium measures 13.0 mm. Right Ovary measures 3.5 x 2.3 x 2.1 cm and contains 2 dominant follicles measuring 2.3 x 1.5 x 1.6 cm and 1.7 x 1.0 x 1.1 cm. It is normal in appearance. Left Ovary measures 2.9 x 2.4 x 1.3 cm (seen transabdominally only). It is normal appearance. Survey of the adnexa demonstrates no adnexal masses. There is no free fluid in the cul de sac. Impression: 1. Anteverted uterus appears enlarged at 11.9 x 7.0 x 6.5 cm. 2. The endometrium appears thickened at 13.0 mm. 3. Bilateral ovaries appear WNL.  Right ovary contains 2  dominant follicles. Recommendations: 1.Clinical correlation with the patient's History and Physical Exam. Dario Ave, RDMS I have reviewed this study and agree with documented findings. [name], MD [facility]    Assessment:    Dysfunctional uterine bleeding   Plan:    Counseling: Procedure, risks, reasons, benefits and complications (including injury to bowel, bladder, major blood vessel, ureter, bleeding, possibility of transfusion, infection, or fistula formation) reviewed in detail. Likelihood of success in alleviating the patient's condition was discussed. Routine postoperative instructions will be reviewed with the patient and her family in detail after surgery.  The patient concurred with the proposed plan, giving informed written consent for the surgery.   Preop testing ordered. Instructions reviewed, including NPO after midnight.     [name], MD [facility]

## 2017-11-20 NOTE — H&P (Signed)
@[facility] @   GYNECOLOGY PREOPERATIVE HISTORY AND PHYSICAL   Subjective:  Sherry Hobbs is a 47 y.o. G1P1001 here for surgical management of dysfunctional uterine bleeding. No significant preoperative concerns.  Proposed surgery: Hysteroscopy D&C with Minerva ablation    Pertinent Gynecological History: Menses: flow is moderate and irregular daily bleeding for the past 2-3 months Bleeding: dysfunctional uterine bleeding Contraception: none Last pap: normal Date: 01/2016   Past Medical History:  Diagnosis Date  . Depression   . History of blood transfusion 05/02/2016  . Hypertension    Past Surgical History:  Procedure Laterality Date  . DILATION AND CURETTAGE OF UTERUS  05/03/2016   Procedure: DILATATION AND CURETTAGE;  Surgeon: [name], MD;  Location: [facility] ORS;  Service: Gynecology;;   OB History  Gravida Para Term Preterm AB Living  1 1 1     1   SAB TAB Ectopic Multiple Live Births          1    # Outcome Date GA Lbr Len/2nd Weight Sex Delivery Anes PTL Lv  1 Term 10/1997 [redacted]w[redacted]d  6 lb 4 oz (2.835 kg) M Vag-Spont   LIV  Patient denies any other pertinent gynecologic issues.  Family History  Problem Relation Age of Onset  . Diabetes Mother   . COPD Mother   . Emphysema Mother   . Osteoporosis Mother   . CAD Father   . Diabetes Sister   . Diabetes Brother   . CAD Brother   . Renal cancer Maternal Uncle        1 uncle  . Brain cancer Maternal Uncle        different uncle  . Diabetes Maternal Grandmother   . Stroke Maternal Grandmother   . Bladder Cancer Maternal Grandfather    Social History   Socioeconomic History  . Marital status: Married    Spouse name: Not on file  . Number of children: Not on file  . Years of education: Not on file  . Highest education level: Not on file  Occupational History  . Not on file  Social Needs  . Financial resource strain: Not on file  . Food insecurity:    Worry: Not on file    Inability: Not on file  .  Transportation needs:    Medical: Not on file    Non-medical: Not on file  Tobacco Use  . Smoking status: Never Smoker  . Smokeless tobacco: Never Used  Substance and Sexual Activity  . Alcohol use: No  . Drug use: No  . Sexual activity: Yes    Birth control/protection: None, Pill  Lifestyle  . Physical activity:    Days per week: Not on file    Minutes per session: Not on file  . Stress: Not on file  Relationships  . Social connections:    Talks on phone: Not on file    Gets together: Not on file    Attends religious service: Not on file    Active member of club or organization: Not on file    Attends meetings of clubs or organizations: Not on file    Relationship status: Not on file  . Intimate partner violence:    Fear of current or ex partner: Not on file    Emotionally abused: Not on file    Physically abused: Not on file    Forced sexual activity: Not on file  Other Topics Concern  . Not on file  Social History Narrative  . Not on file  Current Outpatient Medications on File Prior to Visit  Medication Sig Dispense Refill  . docusate sodium (COLACE) 100 MG capsule Take 1 capsule (100 mg total) by mouth 2 (two) times daily. 60 capsule 6  . ferrous sulfate (FERROUSUL) 325 (65 FE) MG tablet Take 1 tablet (325 mg total) by mouth 2 (two) times daily. 60 tablet 1  . ibuprofen (ADVIL,MOTRIN) 600 MG tablet Take 1 tablet (600 mg total) by mouth every 6 (six) hours as needed. 30 tablet 1  . lisinopril (PRINIVIL,ZESTRIL) 10 MG tablet Take 10 mg by mouth daily.    . norgestrel-ethinyl estradiol (CRYSELLE-28) 0.3-30 MG-MCG tablet 1 pill 4 x daily for 4 days,  then1 pill 3 x daily for 3 days, 1 pill 2 x daily for 2 days, then 1 pill daily. Use active pills only. 3 Package 0   No current facility-administered medications on file prior to visit.    Allergies  Allergen Reactions  . Codeine Nausea And Vomiting      Review of Systems Constitutional: No recent  fever/chills/sweats Respiratory: No recent cough/bronchitis Cardiovascular: No chest pain Gastrointestinal: No recent nausea/vomiting/diarrhea Genitourinary: No UTI symptoms. Positive for persistent moderate to heavy vaginal bleeding Hematologic/lymphatic:No history of coagulopathy or recent blood thinner use    Objective:   Blood pressure 111/72, pulse 90, height 5\' 2"  (1.575 m), weight 195 lb (88.5 kg). CONSTITUTIONAL: Well-developed, well-nourished female in no acute distress.  HENT:  Normocephalic, atraumatic, External right and left ear normal. Oropharynx is clear and moist EYES: Conjunctivae and EOM are normal. Pupils are equal, round, and reactive to light. No scleral icterus.  NECK: Normal range of motion, supple, no masses SKIN: Skin is warm and dry. No rash noted. Not diaphoretic. No erythema. No pallor. NEUROLOGIC: Alert and oriented to person, place, and time. Normal reflexes, muscle tone coordination. No cranial nerve deficit noted. PSYCHIATRIC: Normal mood and affect. Normal behavior. Normal judgment and thought content. CARDIOVASCULAR: Normal heart rate noted, regular rhythm RESPIRATORY: Effort and breath sounds normal, no problems with respiration noted ABDOMEN: Soft, nontender, nondistended. PELVIC: Deferred MUSCULOSKELETAL: Normal range of motion. No edema and no tenderness. 2+ distal pulses.    Labs: Results for orders placed or performed in visit on 11/13/17 (from the past 336 hour(s))  Hemoglobin and hematocrit, blood   Collection Time: 11/13/17 10:02 AM  Result Value Ref Range   Hemoglobin 11.3 11.1 - 15.9 g/dL   Hematocrit 34.5 34.0 - 46.6 %    Pathology:  Endometrium, biopsy - BENIGN SECRETORY-TYPE ENDOMETRIUM WITH DECIDUALIZATION OF THE STROMA, CONSISTENT WITH HORMONE EFFECT. - BENIGN ENDOCERVICAL-TYPE MUCOSA. - THERE IS NO EVIDENCE OF HYPERPLASIA OR MALIGNANCY.   Imaging Studies: US Transvaginal Non-ob  Result Date: 11/14/2017 ULTRASOUND REPORT  Location: [facility] Date of Service:  11/13/2017 Indications: DUB Findings: The uterus measures 11.9 x 7.0 x 6.5 cm. Echo texture is homogeneous without evidence of focal masses. The Endometrium measures 13.0 mm. Right Ovary measures 3.5 x 2.3 x 2.1 cm and contains 2 dominant follicles measuring 2.3 x 1.5 x 1.6 cm and 1.7 x 1.0 x 1.1 cm. It is normal in appearance. Left Ovary measures 2.9 x 2.4 x 1.3 cm (seen transabdominally only). It is normal appearance. Survey of the adnexa demonstrates no adnexal masses. There is no free fluid in the cul de sac. Impression: 1. Anteverted uterus appears enlarged at 11.9 x 7.0 x 6.5 cm. 2. The endometrium appears thickened at 13.0 mm. 3. Bilateral ovaries appear WNL.  Right ovary contains 2  dominant follicles. Recommendations: 1.Clinical correlation with the patient's History and Physical Exam. Dario Ave, RDMS I have reviewed this study and agree with documented findings. [name], MD [facility]    Assessment:    Dysfunctional uterine bleeding   Plan:    Counseling: Procedure, risks, reasons, benefits and complications (including injury to bowel, bladder, major blood vessel, ureter, bleeding, possibility of transfusion, infection, or fistula formation) reviewed in detail. Likelihood of success in alleviating the patient's condition was discussed. Routine postoperative instructions will be reviewed with the patient and her family in detail after surgery.  The patient concurred with the proposed plan, giving informed written consent for the surgery.   Preop testing ordered. Instructions reviewed, including NPO after midnight.     [name], MD [facility]

## 2017-11-20 NOTE — Progress Notes (Signed)
Pt stated that even with the bc pills she was still having irregular cycles. No other complaints.

## 2017-11-26 ENCOUNTER — Encounter: Payer: Self-pay | Admitting: Obstetrics and Gynecology

## 2017-11-29 ENCOUNTER — Other Ambulatory Visit: Payer: Self-pay

## 2017-11-29 ENCOUNTER — Encounter
Admission: RE | Admit: 2017-11-29 | Discharge: 2017-11-29 | Disposition: A | Discharge status: Home / Self Care | Payer: BLUE CROSS/BLUE SHIELD | Source: Ambulatory Visit | Attending: Obstetrics and Gynecology | Admitting: Obstetrics and Gynecology

## 2017-11-29 DIAGNOSIS — Z01818 Encounter for other preprocedural examination: Secondary | ICD-10-CM | POA: Insufficient documentation

## 2017-11-29 HISTORY — DX: Type 2 diabetes mellitus without complications: E11.9

## 2017-11-29 HISTORY — DX: Other specified abnormal findings of blood chemistry: R79.89

## 2017-11-29 HISTORY — DX: Anemia, unspecified: D64.9

## 2017-11-29 HISTORY — DX: Gastro-esophageal reflux disease without esophagitis: K21.9

## 2017-11-29 NOTE — Patient Instructions (Signed)
Your procedure is scheduled on: 12-03-17 MONDAY Report to Same Day Surgery 2nd floor medical mall [facility] Entrance-take elevator on left to 2nd floor.  Check in with surgery information desk.) To find out your arrival time please call [phone] between 1PM - 3PM on 11-30-17 FRIDAY  Remember: Instructions that are not followed completely may result in serious medical risk, up to and including death, or upon the discretion of your surgeon and anesthesiologist your surgery may need to be rescheduled.    _x___ 1. Do not eat food after midnight the night before your procedure. NO GUM OR CANDY AFTER MIDNIGHT.  You may drink WATER up to 2 hours before you are scheduled to arrive at the hospital for your procedure.  Do not drink WATER within 2 hours of your scheduled arrival to the hospital.  Type 1 and type 2 diabetics should only drink water.     __x__ 2. No Alcohol for 24 hours before or after surgery.   __x__3. No Smoking or e-cigarettes for 24 prior to surgery.  Do not use any chewable tobacco products for at least 6 hour prior to surgery   ____  4. Bring all medications with you on the day of surgery if instructed.    __x__ 5. Notify your doctor if there is any change in your medical condition     (cold, fever, infections).    x___6. On the morning of surgery brush your teeth with toothpaste and water.  You may rinse your mouth with mouth wash if you wish.  Do not swallow any toothpaste or mouthwash.   Do not wear jewelry, make-up, hairpins, clips or nail polish.  Do not wear lotions, powders, or perfumes. You may wear deodorant.  Do not shave 48 hours prior to surgery. Men may shave face and neck.  Do not bring valuables to the hospital.    [facility] is not responsible for any belongings or valuables.               Contacts, dentures or bridgework may not be worn into surgery.  Leave your suitcase in the car. After surgery it may be brought to your room.  For patients  admitted to the hospital, discharge time is determined by your treatment team.  _  Patients discharged the day of surgery will not be allowed to drive home.  You will need someone to drive you home and stay with you the night of your procedure.    Please read over the following fact sheets that you were given:   [facility] Preparing for Surgery   ____ Take anti-hypertensive listed below, cardiac, seizure, asthma, anti-reflux and psychiatric medicines. These include:  1. NONE  2.  3.  4.  5.  6.  ____Fleets enema or Magnesium Citrate as directed.   ____ Use CHG Soap or sage wipes as directed on instruction sheet   ____ Use inhalers on the day of surgery and bring to hospital day of surgery  _X___ Stop Metformin  2 days prior to surgery-LAST DOSE ON Friday, July 19TH    ____ Take 1/2 of usual insulin dose the night before surgery and none on the morning surgery.   ____ Follow recommendations from Cardiologist, Pulmonologist or PCP regarding stopping Aspirin, Coumadin, Plavix ,Eliquis, Effient, or Pradaxa, and Pletal.  X____Stop Anti-inflammatories such as Advil, Aleve, Ibuprofen, Motrin, Naproxen, Naprosyn, Goodies powders or aspirin products NOW-OK to take Tylenol    ____ Stop supplements until after surgery.  ____ Bring C-Pap to the hospital.

## 2017-11-30 ENCOUNTER — Encounter
Admission: RE | Admit: 2017-11-30 | Discharge: 2017-11-30 | Disposition: A | Discharge status: Home / Self Care | Payer: BLUE CROSS/BLUE SHIELD | Source: Ambulatory Visit | Attending: Obstetrics and Gynecology | Admitting: Obstetrics and Gynecology

## 2017-11-30 DIAGNOSIS — I10 Essential (primary) hypertension: Secondary | ICD-10-CM | POA: Insufficient documentation

## 2017-11-30 DIAGNOSIS — Z01812 Encounter for preprocedural laboratory examination: Secondary | ICD-10-CM | POA: Insufficient documentation

## 2017-11-30 DIAGNOSIS — Z01810 Encounter for preprocedural cardiovascular examination: Secondary | ICD-10-CM | POA: Diagnosis not present

## 2017-11-30 LAB — BASIC METABOLIC PANEL
Anion gap: 11 (ref 5–15)
BUN: 12 mg/dL (ref 6–20)
CHLORIDE: 105 mmol/L (ref 98–111)
CO2: 22 mmol/L (ref 22–32)
CREATININE: 1.14 mg/dL — AB (ref 0.44–1.00)
Calcium: 9.7 mg/dL (ref 8.9–10.3)
GFR calc non Af Amer: 58 mL/min — ABNORMAL LOW (ref >60)
Glucose, Bld: 91 mg/dL (ref 70–99)
Potassium: 4.1 mmol/L (ref 3.5–5.1)
Sodium: 138 mmol/L (ref 135–145)

## 2017-11-30 LAB — CBC
HCT: 32.8 % — ABNORMAL LOW (ref 35.0–47.0)
HEMOGLOBIN: 11.5 g/dL — AB (ref 12.0–16.0)
MCH: 29.4 pg (ref 26.0–34.0)
MCHC: 35.1 g/dL (ref 32.0–36.0)
MCV: 83.7 fL (ref 80.0–100.0)
PLATELETS: 286 10*3/uL (ref 150–440)
RBC: 3.92 MIL/uL (ref 3.80–5.20)
RDW: 15.1 % — ABNORMAL HIGH (ref 11.5–14.5)
WBC: 10.8 10*3/uL (ref 3.6–11.0)

## 2017-12-03 ENCOUNTER — Encounter
Admission: RE | Disposition: A | Discharge status: Home / Self Care | Payer: Self-pay | Source: Ambulatory Visit | Attending: Obstetrics and Gynecology

## 2017-12-03 ENCOUNTER — Ambulatory Visit: Payer: BLUE CROSS/BLUE SHIELD | Admitting: Certified Registered Nurse Anesthetist

## 2017-12-03 ENCOUNTER — Ambulatory Visit
Admission: RE | Admit: 2017-12-03 | Discharge: 2017-12-03 | Disposition: A | Discharge status: Home / Self Care | Payer: BLUE CROSS/BLUE SHIELD | Source: Ambulatory Visit | Attending: Obstetrics and Gynecology | Admitting: Obstetrics and Gynecology

## 2017-12-03 DIAGNOSIS — I10 Essential (primary) hypertension: Secondary | ICD-10-CM | POA: Diagnosis not present

## 2017-12-03 DIAGNOSIS — Z79899 Other long term (current) drug therapy: Secondary | ICD-10-CM | POA: Diagnosis not present

## 2017-12-03 DIAGNOSIS — N938 Other specified abnormal uterine and vaginal bleeding: Secondary | ICD-10-CM | POA: Diagnosis not present

## 2017-12-03 DIAGNOSIS — Z885 Allergy status to narcotic agent status: Secondary | ICD-10-CM | POA: Diagnosis not present

## 2017-12-03 DIAGNOSIS — Z8052 Family history of malignant neoplasm of bladder: Secondary | ICD-10-CM | POA: Diagnosis not present

## 2017-12-03 DIAGNOSIS — Z8262 Family history of osteoporosis: Secondary | ICD-10-CM | POA: Insufficient documentation

## 2017-12-03 DIAGNOSIS — Z833 Family history of diabetes mellitus: Secondary | ICD-10-CM | POA: Insufficient documentation

## 2017-12-03 DIAGNOSIS — Z825 Family history of asthma and other chronic lower respiratory diseases: Secondary | ICD-10-CM | POA: Diagnosis not present

## 2017-12-03 DIAGNOSIS — Z808 Family history of malignant neoplasm of other organs or systems: Secondary | ICD-10-CM | POA: Diagnosis not present

## 2017-12-03 DIAGNOSIS — E669 Obesity, unspecified: Secondary | ICD-10-CM | POA: Insufficient documentation

## 2017-12-03 DIAGNOSIS — E119 Type 2 diabetes mellitus without complications: Secondary | ICD-10-CM | POA: Insufficient documentation

## 2017-12-03 DIAGNOSIS — Z8051 Family history of malignant neoplasm of kidney: Secondary | ICD-10-CM | POA: Diagnosis not present

## 2017-12-03 DIAGNOSIS — F329 Major depressive disorder, single episode, unspecified: Secondary | ICD-10-CM | POA: Insufficient documentation

## 2017-12-03 DIAGNOSIS — Z7984 Long term (current) use of oral hypoglycemic drugs: Secondary | ICD-10-CM | POA: Insufficient documentation

## 2017-12-03 DIAGNOSIS — K219 Gastro-esophageal reflux disease without esophagitis: Secondary | ICD-10-CM | POA: Insufficient documentation

## 2017-12-03 DIAGNOSIS — Z8249 Family history of ischemic heart disease and other diseases of the circulatory system: Secondary | ICD-10-CM | POA: Diagnosis not present

## 2017-12-03 DIAGNOSIS — Z823 Family history of stroke: Secondary | ICD-10-CM | POA: Diagnosis not present

## 2017-12-03 HISTORY — PX: DILITATION & CURRETTAGE/HYSTROSCOPY WITH NOVASURE ABLATION: SHX5568

## 2017-12-03 LAB — GLUCOSE, CAPILLARY: Glucose-Capillary: 132 mg/dL — ABNORMAL HIGH (ref 70–99)

## 2017-12-03 LAB — POCT PREGNANCY, URINE: PREG TEST UR: NEGATIVE

## 2017-12-03 SURGERY — DILATATION & CURETTAGE/HYSTEROSCOPY WITH NOVASURE ABLATION
Anesthesia: General | Wound class: Clean Contaminated

## 2017-12-03 MED ORDER — LIDOCAINE HCL (CARDIAC) PF 100 MG/5ML IV SOSY
PREFILLED_SYRINGE | INTRAVENOUS | Status: DC | PRN
  Administered 2017-12-03: 100 mg via INTRAVENOUS

## 2017-12-03 MED ORDER — ONDANSETRON HCL 4 MG/2ML IJ SOLN
INTRAMUSCULAR | Status: AC
  Filled 2017-12-03: qty 2

## 2017-12-03 MED ORDER — LIDOCAINE HCL (PF) 2 % IJ SOLN
INTRAMUSCULAR | Status: AC
  Filled 2017-12-03: qty 10

## 2017-12-03 MED ORDER — ONDANSETRON HCL 4 MG/2ML IJ SOLN
INTRAMUSCULAR | Status: DC | PRN
  Administered 2017-12-03: 4 mg via INTRAVENOUS

## 2017-12-03 MED ORDER — PHENYLEPHRINE HCL 10 MG/ML IJ SOLN
INTRAMUSCULAR | Status: AC
  Filled 2017-12-03: qty 1

## 2017-12-03 MED ORDER — ACETAMINOPHEN 10 MG/ML IV SOLN
INTRAVENOUS | Status: AC
  Filled 2017-12-03: qty 100

## 2017-12-03 MED ORDER — FENTANYL CITRATE (PF) 100 MCG/2ML IJ SOLN
INTRAMUSCULAR | Status: DC | PRN
  Administered 2017-12-03 (×3): 25 ug via INTRAVENOUS
  Administered 2017-12-03 (×2): 50 ug via INTRAVENOUS
  Administered 2017-12-03: 25 ug via INTRAVENOUS

## 2017-12-03 MED ORDER — MIDAZOLAM HCL 2 MG/2ML IJ SOLN
INTRAMUSCULAR | Status: DC | PRN
  Administered 2017-12-03: 2 mg via INTRAVENOUS

## 2017-12-03 MED ORDER — DEXAMETHASONE SODIUM PHOSPHATE 10 MG/ML IJ SOLN
INTRAMUSCULAR | Status: AC
  Filled 2017-12-03: qty 1

## 2017-12-03 MED ORDER — ACETAMINOPHEN ER 650 MG PO TBCR: 650.0000 mg | EXTENDED_RELEASE_TABLET | Freq: Three times a day (TID) | ORAL | 1 refills | Status: DC | PRN

## 2017-12-03 MED ORDER — FENTANYL CITRATE (PF) 100 MCG/2ML IJ SOLN
INTRAMUSCULAR | Status: AC
  Filled 2017-12-03: qty 2

## 2017-12-03 MED ORDER — OXYCODONE HCL 5 MG/5ML PO SOLN: 5.0000 mg | Freq: Once | ORAL | Status: AC | PRN

## 2017-12-03 MED ORDER — LACTATED RINGERS IV SOLN
INTRAVENOUS | Status: DC
  Administered 2017-12-03 (×2): via INTRAVENOUS

## 2017-12-03 MED ORDER — MEPERIDINE HCL 50 MG/ML IJ SOLN: 6.2500 mg | INTRAMUSCULAR | Status: DC | PRN

## 2017-12-03 MED ORDER — FENTANYL CITRATE (PF) 100 MCG/2ML IJ SOLN
25.0000 ug | INTRAMUSCULAR | Status: DC | PRN
  Administered 2017-12-03 (×4): 25 ug via INTRAVENOUS

## 2017-12-03 MED ORDER — DEXAMETHASONE SODIUM PHOSPHATE 10 MG/ML IJ SOLN
INTRAMUSCULAR | Status: DC | PRN
  Administered 2017-12-03: 5 mg via INTRAVENOUS

## 2017-12-03 MED ORDER — FAMOTIDINE 20 MG PO TABS
ORAL_TABLET | ORAL | Status: AC
  Filled 2017-12-03: qty 1

## 2017-12-03 MED ORDER — PROMETHAZINE HCL 25 MG/ML IJ SOLN: 6.2500 mg | INTRAMUSCULAR | Status: DC | PRN

## 2017-12-03 MED ORDER — ACETAMINOPHEN 10 MG/ML IV SOLN
INTRAVENOUS | Status: DC | PRN
  Administered 2017-12-03: 1000 mg via INTRAVENOUS

## 2017-12-03 MED ORDER — OXYCODONE HCL 5 MG PO TABS
ORAL_TABLET | ORAL | Status: AC
  Administered 2017-12-03: 5 mg via ORAL
  Filled 2017-12-03: qty 1

## 2017-12-03 MED ORDER — SUCCINYLCHOLINE CHLORIDE 20 MG/ML IJ SOLN
INTRAMUSCULAR | Status: DC | PRN
  Administered 2017-12-03: 100 mg via INTRAVENOUS

## 2017-12-03 MED ORDER — PROPOFOL 10 MG/ML IV BOLUS
INTRAVENOUS | Status: DC | PRN
  Administered 2017-12-03: 50 mg via INTRAVENOUS
  Administered 2017-12-03: 40 mg via INTRAVENOUS
  Administered 2017-12-03: 160 mg via INTRAVENOUS

## 2017-12-03 MED ORDER — SODIUM CHLORIDE 0.9 % IV SOLN: INTRAVENOUS | Status: DC

## 2017-12-03 MED ORDER — PROPOFOL 10 MG/ML IV BOLUS
INTRAVENOUS | Status: AC
  Filled 2017-12-03: qty 60

## 2017-12-03 MED ORDER — FAMOTIDINE 20 MG PO TABS
20.0000 mg | ORAL_TABLET | Freq: Once | ORAL | Status: AC
  Administered 2017-12-03: 20 mg via ORAL

## 2017-12-03 MED ORDER — SUCCINYLCHOLINE CHLORIDE 20 MG/ML IJ SOLN
INTRAMUSCULAR | Status: AC
  Filled 2017-12-03: qty 1

## 2017-12-03 MED ORDER — OXYCODONE HCL 5 MG PO TABS
5.0000 mg | ORAL_TABLET | Freq: Once | ORAL | Status: AC | PRN
  Administered 2017-12-03: 5 mg via ORAL

## 2017-12-03 MED ORDER — GLYCOPYRROLATE 0.2 MG/ML IJ SOLN
INTRAMUSCULAR | Status: AC
  Filled 2017-12-03: qty 1

## 2017-12-03 MED ORDER — MIDAZOLAM HCL 2 MG/2ML IJ SOLN
INTRAMUSCULAR | Status: AC
  Filled 2017-12-03: qty 2

## 2017-12-03 MED ORDER — IBUPROFEN 600 MG PO TABS: 600.0000 mg | ORAL_TABLET | Freq: Four times a day (QID) | ORAL | 1 refills | Status: DC | PRN

## 2017-12-03 MED ORDER — FENTANYL CITRATE (PF) 100 MCG/2ML IJ SOLN
INTRAMUSCULAR | Status: AC
  Administered 2017-12-03: 25 ug via INTRAVENOUS
  Filled 2017-12-03: qty 2

## 2017-12-03 MED ORDER — PHENYLEPHRINE HCL 10 MG/ML IJ SOLN
INTRAMUSCULAR | Status: DC | PRN
  Administered 2017-12-03 (×3): 100 ug via INTRAVENOUS

## 2017-12-03 SURGICAL SUPPLY — 25 items
ABLATOR SURESOUND NOVASURE (ABLATOR) IMPLANT
CANISTER SUCT 1200ML W/VALVE (MISCELLANEOUS) ×3 IMPLANT
CATH ROBINSON RED A/P 16FR (CATHETERS) ×3 IMPLANT
DRAPE LEGGINS SURG 28X43 STRL (DRAPES) ×3 IMPLANT
DRAPE SHEET LG 3/4 BI-LAMINATE (DRAPES) ×3 IMPLANT
DRSG TELFA 3X8 NADH (GAUZE/BANDAGES/DRESSINGS) ×3 IMPLANT
ELECT REM PT RETURN 9FT ADLT (ELECTROSURGICAL) ×3
ELECTRODE REM PT RTRN 9FT ADLT (ELECTROSURGICAL) ×1 IMPLANT
GLOVE BIO SURGEON STRL SZ 6.5 (GLOVE) ×2 IMPLANT
GLOVE BIO SURGEONS STRL SZ 6.5 (GLOVE) ×1
GLOVE INDICATOR 7.0 STRL GRN (GLOVE) ×3 IMPLANT
GOWN STRL REUS W/ TWL LRG LVL3 (GOWN DISPOSABLE) ×1 IMPLANT
GOWN STRL REUS W/TWL LRG LVL3 (GOWN DISPOSABLE) ×2
HANDPIECE ABLA MINERVA ENDO (MISCELLANEOUS) ×3 IMPLANT
IV LACTATED RINGERS 1000ML (IV SOLUTION) ×3 IMPLANT
KIT TURNOVER CYSTO (KITS) ×3 IMPLANT
LABEL OR SOLS (LABEL) ×3 IMPLANT
NS IRRIG 500ML POUR BTL (IV SOLUTION) ×3 IMPLANT
PACK DNC HYST (MISCELLANEOUS) ×3 IMPLANT
PAD OB MATERNITY 4.3X12.25 (PERSONAL CARE ITEMS) ×3 IMPLANT
PAD PREP 24X41 OB/GYN DISP (PERSONAL CARE ITEMS) ×3 IMPLANT
SURGILUBE 2OZ TUBE FLIPTOP (MISCELLANEOUS) ×3 IMPLANT
TOWEL OR 17X26 4PK STRL BLUE (TOWEL DISPOSABLE) ×3 IMPLANT
TUBING CONNECTING 10 (TUBING) ×2 IMPLANT
TUBING CONNECTING 10' (TUBING) ×1

## 2017-12-03 NOTE — Anesthesia Procedure Notes (Signed)
Procedure Name: Intubation Date/Time: 12/03/2017 9:10 AM Performed by: Lowry Bowl, CRNA Pre-anesthesia Checklist: Patient identified, Emergency Drugs available, Suction available and Patient being monitored Patient Re-evaluated:Patient Re-evaluated prior to induction Oxygen Delivery Method: Circle system utilized Preoxygenation: Pre-oxygenation with 100% oxygen Induction Type: IV induction, Cricoid Pressure applied and Rapid sequence Ventilation: Mask ventilation without difficulty Laryngoscope Size: Mac and 3 Grade View: Grade II Tube type: Oral Tube size: 7.0 mm Number of attempts: 1 Airway Equipment and Method: Stylet Placement Confirmation: ETT inserted through vocal cords under direct vision,  positive ETCO2 and breath sounds checked- equal and bilateral Secured at: 21 cm Tube secured with: Tape Dental Injury: Teeth and Oropharynx as per pre-operative assessment

## 2017-12-03 NOTE — Op Note (Signed)
Procedure(s): DILATATION & CURETTAGE/HYSTEROSCOPY WITH MINERVA ABLATION Procedure Note  Sherry Hobbs female 47 y.o. 12/03/2017  Indications: The patient is a 47 y.o. G72P1001 female with abnormal uterine bleeding  Pre-operative Diagnosis: Abnormal uterine bleeding  Post-operative Diagnosis: Same  Surgeon: [name], MD  Assistants:  Ave Filter, [employer] [employer] student   Anesthesia: General endotracheal anesthesia  Findings: Uterus sounded to 9.5 cm.  Cervical length 4 cm.  Proliferative endometrium.  Tubal ostia were visualized bilaterally.  No intrauterine masses.  Total ablation time 2 minutes.   Adequate blanching of endometrial tissue post ablation.  No perforations noted.   Procedure Details: The patient was seen in the Holding Room. The risks, benefits, complications, treatment options, and expected outcomes were discussed with the patient.  The patient concurred with the proposed plan, giving informed consent.  The site of surgery properly noted/marked. The patient was taken to the Operating Room, identified as Sherry Hobbs and the procedure verified as Procedure(s) (LRB): DILATATION & CURETTAGE/HYSTEROSCOPY WITH MINERVA ABLATION (N/A). A Time Out was held and the above information confirmed.  She was then placed under general anesthesia without difficulty. She was placed in the dorsal lithotomy position, and was prepped and draped in a sterile manner.  A straight catheterization was performed. A sterile speculum was inserted into the vagina and the cervix was grasped at the anterior lip using a single-toothed tenaculum.  The uterus was sounded to 9.5 cm.  Cervical dilation was performed. A 5 mm hysteroscope was introduced into the uterus under direct visualization. The cavity was allowed to fill, and then the entire cavity was explored with the findings described above. The hysteroscope was removed, and a sharp curette was then passed into the uterus and endometrial sampling  was collected for pathology.   Next the Minerva instrument was primed per instructions. The instrument was then placed into the endometrial canal and activated.  Total ablation time was 2 minutes.  The Minerva instrument was then removed from the uterine cavity.  The hysteroscope was then re-introduced for final survey, with adequate charring of the endometrium noted.  The hysteroscope was removed from the patient's uterine cavity. The tenaculum was removed and excellent hemostasis was noted. The speculum was removed from the vagina.   All instrument and sponge counts were correct at the end of the procedure x 2.  The patient tolerated the procedure well.  She was awakened and taken to the PACU in stable condition.    Estimated Blood Loss:  5 ml      Drains: straight catheterization with 50 ml at start of procedure.          Total IV Fluids:  1000 ml  Specimens:  Endometrial curettings         Implants: None         Complications:  None; patient tolerated the procedure well.         Disposition: PACU - hemodynamically stable.         Condition: stable   [name], MD [facility]

## 2017-12-03 NOTE — Anesthesia Post-op Follow-up Note (Signed)
Anesthesia QCDR form completed.        

## 2017-12-03 NOTE — Transfer of Care (Signed)
Immediate Anesthesia Transfer of Care Note  Patient: Sherry Hobbs  Procedure(s) Performed: DILATATION & CURETTAGE/HYSTEROSCOPY WITH MINERVA ABLATION (N/A )  Patient Location: PACU  Anesthesia Type:General  Level of Consciousness: awake, oriented, drowsy and patient cooperative  Airway & Oxygen Therapy: Patient Spontanous Breathing and Patient connected to face mask oxygen  Post-op Assessment: Report given to RN, Post -op Vital signs reviewed and stable and Patient moving all extremities  Post vital signs: Reviewed and stable  Last Vitals:  Vitals Value Taken Time  BP 146/59 12/03/2017 10:20 AM  Temp    Pulse 85 12/03/2017 10:27 AM  Resp 20 12/03/2017 10:27 AM  SpO2 96 % 12/03/2017 10:27 AM  Vitals shown include unvalidated device data.  Last Pain:  Vitals:   12/03/17 0823  TempSrc: Tympanic         Complications: No apparent anesthesia complications

## 2017-12-03 NOTE — Interval H&P Note (Signed)
History and Physical Interval Note:  12/03/2017 8:51 AM  Sherry Hobbs  has presented today for surgery, with the diagnosis of DYSFUNCTIONAL UTERINE BLEEDING  The various methods of treatment have been discussed with the patient and family. After consideration of risks, benefits and other options for treatment, the patient has consented to  Procedure(s) with comments: [place] (N/A) - MINERVA REP CONTACTED [facility] [phone]) as a surgical intervention .  The patient's history has been reviewed, patient examined, no change in status, stable for surgery.  I have reviewed the patient's chart and labs.  Questions were answered to the patient's satisfaction.     [name], MD [facility]

## 2017-12-03 NOTE — Discharge Instructions (Signed)
General Gynecological Post-Operative Instructions You may expect to feel dizzy, weak, and drowsy for as long as 24 hours after receiving the medicine that made you sleep (anesthetic).  Do not drive a car, ride a bicycle, participate in physical activities, or take public transportation until you are done taking narcotic pain medicines or as directed by your doctor.  Do not drink alcohol or take tranquilizers.  Do not take medicine that has not been prescribed by your doctor.  Do not sign important papers or make important decisi3ons while on narcotic pain medicines.  Have a responsible person with you.  CARE OF INCISION  Keep incision clean and dry. Take showers instead of baths until your doctor gives you permission to take baths.  Avoid heavy lifting (more than 10 pounds/4.5 kilograms), pushing, or pulling.  Avoid activities that may risk injury to your surgical site.  No sexual intercourse or placement of anything in the vagina for 4 weeks or as instructed by your doctor. If you have tubes coming from the wound site, check with your doctor regarding appropriate care of the tubes. Only take prescription or over-the-counter medicines  for pain, discomfort, or fever as directed by your doctor. Do not take aspirin. It can make you bleed. Take medicines (antibiotics) that kill germs if they are prescribed for you.  Call the office or go to the Emergency Room if:  You feel sick to your stomach (nauseous).  You start to throw up (vomit).  You have trouble eating or drinking.  You have an oral temperature above 101.  You have constipation that is not helped by adjusting diet or increasing fluid intake. Pain medicines are a common cause of constipation.  You have any other concerns. SEEK IMMEDIATE MEDICAL CARE IF:  You have persistent dizziness.  You have difficulty breathing or a congested sounding (croupy) cough.  You have an oral temperature above 102.5, not controlled by medicine.  There is  increasing pain or tenderness near or in the surgical site.

## 2017-12-03 NOTE — Anesthesia Preprocedure Evaluation (Signed)
Anesthesia Evaluation  Patient identified by MRN, date of birth, ID band Patient awake    Reviewed: Allergy & Precautions, NPO status , Patient's Chart, lab work & pertinent test results  History of Anesthesia Complications Negative for: history of anesthetic complications  Airway Mallampati: III  TM Distance: >3 FB Neck ROM: Full    Dental  (+)    Pulmonary neg pulmonary ROS, neg sleep apnea, neg COPD,    breath sounds clear to auscultation- rhonchi (-) wheezing      Cardiovascular Exercise Tolerance: Good hypertension, Pt. on medications (-) CAD, (-) Past MI, (-) Cardiac Stents and (-) CABG  Rhythm:Regular Rate:Normal - Systolic murmurs and - Diastolic murmurs    Neuro/Psych PSYCHIATRIC DISORDERS Depression negative neurological ROS     GI/Hepatic Neg liver ROS, GERD  ,  Endo/Other  diabetes, Oral Hypoglycemic Agents  Renal/GU negative Renal ROS     Musculoskeletal negative musculoskeletal ROS (+)   Abdominal (+) + obese,   Peds  Hematology  (+) anemia ,   Anesthesia Other Findings Past Medical History: No date: Anemia No date: Depression No date: Diabetes mellitus without complication (HCC) No date: Elevated serum creatinine No date: GERD (gastroesophageal reflux disease)     Comment:  OCC  NO MEDS 05/02/2016: History of blood transfusion No date: Hypertension   Reproductive/Obstetrics                             Anesthesia Physical Anesthesia Plan  ASA: II  Anesthesia Plan: General   Post-op Pain Management:    Induction: Intravenous  PONV Risk Score and Plan: 2 and Ondansetron and Midazolam  Airway Management Planned: Oral ETT  Additional Equipment:   Intra-op Plan:   Post-operative Plan: Extubation in OR  Informed Consent: I have reviewed the patients History and Physical, chart, labs and discussed the procedure including the risks, benefits and alternatives  for the proposed anesthesia with the patient or authorized representative who has indicated his/her understanding and acceptance.   Dental advisory given  Plan Discussed with: CRNA and Anesthesiologist  Anesthesia Plan Comments:         Anesthesia Quick Evaluation

## 2017-12-03 NOTE — Anesthesia Postprocedure Evaluation (Signed)
Anesthesia Post Note  Patient: Tax inspector  Procedure(s) Performed: DILATATION & CURETTAGE/HYSTEROSCOPY WITH MINERVA ABLATION (N/A )  Patient location during evaluation: PACU Anesthesia Type: General Level of consciousness: awake and alert and oriented Pain management: pain level controlled Vital Signs Assessment: post-procedure vital signs reviewed and stable Respiratory status: spontaneous breathing, nonlabored ventilation and respiratory function stable Cardiovascular status: blood pressure returned to baseline and stable Postop Assessment: no signs of nausea or vomiting Anesthetic complications: no     Last Vitals:  Vitals:   12/03/17 1224 12/03/17 1255  BP: 138/70 (!) 140/55  Pulse: 71 72  Resp: 20   Temp: (!) 36 C   SpO2:      Last Pain:  Vitals:   12/03/17 1255  TempSrc:   PainSc: 6                  Emmalie Haigh

## 2017-12-05 LAB — SURGICAL PATHOLOGY

## 2017-12-06 ENCOUNTER — Encounter: Payer: Self-pay | Admitting: Obstetrics and Gynecology

## 2017-12-11 ENCOUNTER — Ambulatory Visit (INDEPENDENT_AMBULATORY_CARE_PROVIDER_SITE_OTHER): Payer: BLUE CROSS/BLUE SHIELD | Admitting: Obstetrics and Gynecology

## 2017-12-11 ENCOUNTER — Encounter: Payer: Self-pay | Admitting: Obstetrics and Gynecology

## 2017-12-11 VITALS — BP 131/81 | HR 76 | Ht 62.0 in | Wt 187.3 lb

## 2017-12-11 DIAGNOSIS — Z4889 Encounter for other specified surgical aftercare: Secondary | ICD-10-CM

## 2017-12-11 DIAGNOSIS — Z9889 Other specified postprocedural states: Secondary | ICD-10-CM

## 2017-12-11 NOTE — Progress Notes (Signed)
Pt stated that she is doing well.

## 2017-12-11 NOTE — Progress Notes (Signed)
    OBSTETRICS/GYNECOLOGY POST-OPERATIVE CLINIC VISIT  Subjective:     Sherry Hobbs is a 47 y.o. female who presents to the clinic 1 weeks status post Hysteroscopy D&C with endometrial ablation for abnormal uterine bleeding. Eating a regular diet without difficulty. Bowel movements are normal. The patient is not having any pain.  She denies foul-smelling discharge. She does note some occasional brown vaginal spotting.     The following portions of the patient's history were reviewed and updated as appropriate: allergies, current medications, past family history, past medical history, past social history, past surgical history and problem list.  Review of Systems Pertinent items noted in HPI and remainder of comprehensive ROS otherwise negative.    Objective:    BP 131/81   Pulse 76   Ht 5\' 2"  (1.575 m)   Wt 187 lb 4.8 oz (85 kg)   LMP 11/26/2017   BMI 34.26 kg/m  General:  alert and no distress  Abdomen: soft, bowel sounds active, non-tender  Pelvis:  external genitalia normal. Vagina with scan brown blood. Cervix appears normal, no lesions.     Pathology:  A. ENDOMETRIUM; CURETTAGE:  - PROGESTIN EFFECT WITH FOCAL BREAKDOWN.  - SCANT ENDOCERVICAL TISSUE.  - NEGATIVE FOR HYPERPLASIA, ATYPIA, AND MALIGNANCY.   Assessment:    Doing well postoperatively.   Plan:   1. Continue any current medications. 2. Wound care discussed. 3. Operative findings again reviewed. Pathology report discussed. 4. Activity restrictions: none 5. Anticipated return to work: now. 6. Follow up: as needed.    [name], MD [facility]

## 2018-01-03 ENCOUNTER — Other Ambulatory Visit: Payer: Self-pay

## 2018-01-03 ENCOUNTER — Telehealth: Payer: Self-pay | Admitting: Obstetrics and Gynecology

## 2018-01-03 MED ORDER — MEDROXYPROGESTERONE ACETATE 10 MG PO TABS: 10.0000 mg | ORAL_TABLET | Freq: Every day | ORAL | 0 refills | Status: DC

## 2018-01-03 NOTE — Telephone Encounter (Signed)
The patient called and lvm for a nurse to call her back to answer some questions for her, The patient also stated that she sent a MyChart message to Dr. Marcelline Mates as well. No other information was disclosed, Please advise.

## 2018-01-04 NOTE — Telephone Encounter (Signed)
Spoke with pt through [employer]. Please see pt advise request.

## 2018-01-11 ENCOUNTER — Other Ambulatory Visit: Payer: Self-pay

## 2018-01-11 MED ORDER — MEDROXYPROGESTERONE ACETATE 10 MG PO TABS: 10.0000 mg | ORAL_TABLET | Freq: Every day | ORAL | 0 refills | Status: DC

## 2018-01-16 ENCOUNTER — Other Ambulatory Visit: Payer: Self-pay

## 2018-01-16 MED ORDER — MEDROXYPROGESTERONE ACETATE 10 MG PO TABS: 10.0000 mg | ORAL_TABLET | Freq: Every day | ORAL | 0 refills | Status: DC

## 2018-01-22 ENCOUNTER — Other Ambulatory Visit: Payer: Self-pay

## 2018-01-22 ENCOUNTER — Ambulatory Visit: Payer: BLUE CROSS/BLUE SHIELD | Attending: Family Medicine

## 2018-01-22 DIAGNOSIS — M545 Low back pain: Secondary | ICD-10-CM | POA: Diagnosis not present

## 2018-01-22 DIAGNOSIS — G8929 Other chronic pain: Secondary | ICD-10-CM

## 2018-01-22 NOTE — Therapy (Addendum)
[facility] [address], [place], [address] Phone: [phone]   Fax:  [phone]  Physical Therapy Evaluation  Patient Details  Name: Sherry Hobbs MRN: [id number] Date of Birth: Feb 24, 1971 Referring Provider: Elyse Jarvis   Encounter Date: 01/22/2018  PT End of Session - 01/22/18 1745    Visit Number  1    Number of Visits  16    Date for PT Re-Evaluation  03/19/18    Authorization Type  BCBS    PT Start Time  1730    PT Stop Time  1823    PT Time Calculation (min)  53 min    Activity Tolerance  Patient tolerated treatment well    Behavior During Therapy  [facility] for tasks assessed/performed       Past Medical History:  Diagnosis Date  . Anemia   . Depression   . Diabetes mellitus without complication ([place])   . Elevated serum creatinine   . GERD (gastroesophageal reflux disease)    OCC  NO MEDS  . History of blood transfusion 05/02/2016  . Hypertension     Past Surgical History:  Procedure Laterality Date  . DILATION AND CURETTAGE OF UTERUS  05/03/2016   Procedure: DILATATION AND CURETTAGE;  Surgeon: [name], MD;  Location: [facility] ORS;  Service: Gynecology;;  . Murrell Redden & CURRETTAGE/HYSTROSCOPY WITH NOVASURE ABLATION N/A 12/03/2017   Procedure: DILATATION & CURETTAGE/HYSTEROSCOPY WITH MINERVA ABLATION;  Surgeon: [name], MD;  Location: [facility] ORS;  Service: Gynecology;  Laterality: N/A;  MINERVA REP CONTACTED ([place] [phone])      Subjective Assessment - 01/22/18 1740    Subjective  Patient reports that currently she is not in pain. Worst pain in last month was 10/10.    Pertinent History  Patient reports that her back pain started about 6 months ago, along with hip pain L>R. Incidious onset. Went to physician when it progressed to hurting on R side and when sitting became intolerably painful. Reports that she had 1 injection to L hip about 2 months ago which significantly  helped with her pain.States that she does have numbness/tingling in b/l feet and complains of restless legs at night (history of diabetes). Overall symptoms have improved. PMH of diabetes, HTN, dilation and curettage /hystroscopy with novasure ablation 12/03/17.     Limitations  Sitting;Walking;Lifting;House hold activities;Standing    How long can you sit comfortably?  1 hr     How long can you stand comfortably?  45 mins    How long can you walk comfortably?  improves symptoms    Patient Stated Goals  pain relief, how to self manage condition    Currently in Pain?  No/denies    Pain Score  0-No pain   worst: 10 best: 0    Pain Location  Back    Pain Orientation  Right;Left;Lower;Medial    Pain Descriptors / Indicators  Sharp;Cramping    Pain Type  Chronic pain    Pain Onset  More than a month ago    Pain Frequency  Intermittent    Aggravating Factors   sitting,     Pain Relieving Factors  walking, meds, heating pad    Effect of Pain on Daily Activities  driving, job duties, household activities with pain          01/22/18 0001  Assessment  Medical Diagnosis low back pain, chronic  Referring Provider Elyse Jarvis  Onset Date/Surgical Date 07/13/17  Hand Dominance Right  Prior Therapy no  Precautions  Precautions None  Restrictions  Weight Bearing Restrictions No  Balance Screen  Has the patient fallen in the past 6 months No  Has the patient had a decrease in activity level because of a fear of falling?  Yes  Is the patient reluctant to leave their home because of a fear of falling?  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  Living Arrangements Spouse/significant other  Available Help at Discharge Family  Type of [place] to enter  Entrance Stairs-Number of Steps 2  Entrance Stairs-Rails None  Home Layout One level  Home Equipment None  Prior Function  Level of Independence Independent  Vocation Full time employment   Dentist (sitting, computer work, occasionally awkward positions)  Cognition  Overall Cognitive Status Within Functional Limits for tasks assessed  Sensation  Light Touch Appears Intact  Posture/Postural Control  Posture Comments decreased lumbar lordosis, fwd head/round shoulders  ROM / Strength  AROM / PROM / Strength AROM;Strength  AROM  Overall AROM  Deficits  AROM Assessment Site Lumbar;Hip  Right/Left Hip Right;Left  Lumbar Flexion 90 (painful)  Lumbar Extension 100  Lumbar - Right Side Bend 100  Lumbar - Left Side Bend 60 (painful)  Lumbar - Right Rotation 100  Lumbar - Left Rotation 100  Right Hip Extension 30  Right Hip Flexion 110  Right Hip External Rotation  20  Right Hip Internal Rotation  20  Right Hip ABduction 45  Right Hip ADduction 45  Left Hip Extension 30  Left Hip Flexion 110  Left Hip External Rotation  20  Left Hip Internal Rotation  20  Left Hip ABduction 45  Left Hip ADduction 45  Strength  Overall Strength Deficits  Strength Assessment Site Hip;Knee;Ankle  Right/Left Hip Right;Left  Right/Left Knee Left;Right  Right/Left Ankle Right;Left  Right Hip Flexion 4+/5  Right Hip Extension 4-/5  Right Hip ABduction 4/5  Right Hip ADduction 3+/5  Left Hip Flexion 4-/5  Left Hip Extension 3+/5  Left Hip ABduction 3/5  Left Hip ADduction 3/5  Right Knee Flexion 4+/5  Right Knee Extension 4+/5  Left Knee Flexion 4/5  Left Knee Extension 4+/5  Right Ankle Dorsiflexion 4+/5  Right Ankle Plantar Flexion 4+/5  Left Ankle Dorsiflexion 4+/5  Left Ankle Plantar Flexion 4+/5  Flexibility  Soft Tissue Assessment /Muscle Length y  Hamstrings mild  Quadriceps mild  Piriformis mild R mod L  Palpation  Spinal mobility normal  SI assessment  L anterior innominate rotation  Palpation comment posterior L hip (glute med/min, piriformis)  Special Tests   Special Tests Sacrolliac Tests  Other special tests heel  strike -, SLR -, slump -  Sacroiliac Tests  Sacral Compression  Pelvic Compression  Findings Positive  Side Rt  Sacral thrust   Findings Negative  Sacral Compression  Findings Negative    Therapeutic exercise: Patient performed with instruction, verbal cues, tactile cues of therapist: goal: improve ROM, tissue extensibility, hip strength  Innominate rotation MET L (anterior rotation) x 62mns with improvement noted in symptoms per patient "looser". Improved leg length discrepancy Piriformis stretch b/l with demo from PT 2x20"sec LTR in supine with tactile cues, 10x3"sec hold Bridges in supine with verbal cues x10    Patient response to treatment: patient demonstrated improved technique with exercises with repeated demonstration and VC.    Objective measurements completed on examination: See above findings.     PT Education -  01/22/18 1838    Education Details  condition, role of PT, expectations of care, HEP instruction    Person(s) Educated  Patient    Methods  Demonstration;Explanation;Verbal cues;Handout    Comprehension  Verbalized understanding;Returned demonstration       PT Short Term Goals - 01/23/18 0804      PT SHORT TERM GOAL #1   Title  Patient will be adherent to HEP at least 3x a week to improve functional strength and in prep for self management of condition.    Time  4    Period  Weeks    Status  New    Target Date  02/20/18        PT Long Term Goals - 01/23/18 0808      PT LONG TERM GOAL #1   Title  Patient will increase BLE gross strength to 4+/5 as to improve functional strength for independent gait, increased standing tolerance and increased ADL ability.    Time  8    Period  Weeks    Status  New    Target Date  03/20/18      PT LONG TERM GOAL #2   Title   Patient will be able to perform household work/chores without increase in symptoms.    Time  8    Period  Weeks    Status  New    Target Date  03/20/18      PT LONG TERM GOAL #3    Title  Patient will be able to tolerate sitting for 1 hr with pain 3/10 or less to promote ability to perform work duties.    Time  8    Period  Weeks    Status  New    Target Date  03/20/18      PT LONG TERM GOAL #4   Title  Patient will improve FOTO score by at least 10 points indicating an increased ability/tolerance to perform functional activities to return patient to PLOF.    Time  8    Period  Weeks    Status  New    Target Date  03/20/18             Plan - 01/22/18 1822    Clinical Impression Statement  Patient is 47 yo female that presents with chronic LBP that started about 6 months ago with insidious onset. Patient also reports onset of uterine/reproductive concerns/issues around the same time period. PMH includes diabetes, HTN and dilation and curettage/hystroscopy with novasure ablation 12/03/17. Upon assessment patient demonstrates deficits in LE strength, activity tolerance, pain with L hip ROM, impaired soft tissue integrity, decreased core strength as well as potential pelvic misalignment (L anterior innominate rotation). The patient would benefit from further skilled PT to address these issues and return patient to PLOF.     History and Personal Factors relevant to plan of care:  HTN, diabetes, recent uterine surgery    Clinical Presentation  Stable    Clinical Presentation due to:  symptoms unchanged recently (improvement overall since onset)    Clinical Decision Making  Low    Rehab Potential  Good    Clinical Impairments Affecting Rehab Potential  decreased core/LE strength, posutral abnormalities, + motivation    PT Frequency  2x / week    PT Duration  8 weeks    PT Treatment/Interventions  ADLs/Self Care Home Management;Aquatic Therapy;Electrical Stimulation;Moist Heat;Ultrasound;Traction;Gait training;Stair training;Functional mobility training;Therapeutic activities;Neuromuscular re-education;Balance training;Therapeutic exercise;Patient/family education;Manual  techniques;Spinal Manipulations;Joint Manipulations;Dry needling;Scar mobilization;Taping  PT Next Visit Plan  IASTM/STM low back, L hip, stretches, hip strengthening, SI tests/alignment    PT Home Exercise Plan  [id number] : piriformis stretch, LTR, bridges    Consulted and Agree with Plan of Care  Patient       Patient will benefit from skilled therapeutic intervention in order to improve the following deficits and impairments:  Decreased endurance, Decreased mobility, Increased muscle spasms, Improper body mechanics, Decreased range of motion, Decreased activity tolerance, Decreased strength, Impaired flexibility, Increased fascial restricitons, Postural dysfunction, Pain  Visit Diagnosis: Chronic bilateral low back pain without sciatica     Problem List Patient Active Problem List   Diagnosis Date Noted  . History of blood transfusion 05/02/2016  . DUB (dysfunctional uterine bleeding) 03/31/2016  . Ovarian cyst 03/31/2016  . Anemia 03/30/2016    Lieutenant Diego PT, DPT 8:17 AM,01/23/18 [facility] [address] [place], [address] Phone: [phone]   Fax:  [phone]  Name: Mira Balon MRN: [id number] Date of Birth: [birth year]

## 2018-01-22 NOTE — Patient Instructions (Signed)
Access Code: [email or web address]  URL: [email or web address]  Date: 01/22/2018  Prepared by: Lieutenant Diego   Exercises  Supine Bridge - 10 reps - 1 sets - 1x daily - 7x weekly  Supine Lower Trunk Rotation - 10 reps - 1 sets - 3 hold - 1x daily - 7x weekly  Supine Piriformis Stretch with Foot on Ground - 3 reps - 1 sets - 20 hold - 1x daily - 7x weekly

## 2018-01-24 ENCOUNTER — Ambulatory Visit: Payer: BLUE CROSS/BLUE SHIELD

## 2018-01-28 ENCOUNTER — Ambulatory Visit: Payer: BLUE CROSS/BLUE SHIELD

## 2018-01-28 DIAGNOSIS — M545 Low back pain: Principal | ICD-10-CM

## 2018-01-28 DIAGNOSIS — G8929 Other chronic pain: Secondary | ICD-10-CM

## 2018-01-28 NOTE — Therapy (Signed)
[facility] [address] [place], [address] Phone: [phone]   Fax:  [phone]  Physical Therapy Treatment  Patient Details  Name: Sherry Hobbs MRN: [id number] Date of Birth: Aug 07, 1970 Referring Provider: Elyse Jarvis   Encounter Date: 01/28/2018  PT End of Session - 01/28/18 1747    Visit Number  2    Number of Visits  16    Date for PT Re-Evaluation  03/19/18    Authorization Type  BCBS    PT Start Time  1745    PT Stop Time  1823    PT Time Calculation (min)  38 min    Activity Tolerance  Patient tolerated treatment well    Behavior During Therapy  [facility] for tasks assessed/performed       Past Medical History:  Diagnosis Date  . Anemia   . Depression   . Diabetes mellitus without complication ([place])   . Elevated serum creatinine   . GERD (gastroesophageal reflux disease)    OCC  NO MEDS  . History of blood transfusion 05/02/2016  . Hypertension     Past Surgical History:  Procedure Laterality Date  . DILATION AND CURETTAGE OF UTERUS  05/03/2016   Procedure: DILATATION AND CURETTAGE;  Surgeon: [name], MD;  Location: [facility] ORS;  Service: Gynecology;;  . Murrell Redden & CURRETTAGE/HYSTROSCOPY WITH NOVASURE ABLATION N/A 12/03/2017   Procedure: DILATATION & CURETTAGE/HYSTEROSCOPY WITH MINERVA ABLATION;  Surgeon: [name], MD;  Location: [facility] ORS;  Service: Gynecology;  Laterality: N/A;  MINERVA REP CONTACTED [facility] [place] [facility] [phone])    There were no vitals filed for this visit.  Subjective Assessment - 01/28/18 1803    Subjective  Patient reports mild soreness at start of session but no pain. No major complaints after initial evaluation.    Currently in Pain?  No/denies    Pain Onset  More than a month ago       Therapeutic exercise:Patient performed with instruction, verbal cues, tactile cues of therapist: goal: improve ROM, tissue extensibility, hip strength  Patient  without noticeable innominate rotation this session, MET held. Piriformis stretch b/l with demo from PT 3x30"sec LTR in supine with tactile cues, 10x3"sec hold Bridges in supine with verbal cues 2x10  hooklying hip abduction with RTB x15 verbal/tactile cues Seated HS stretch 3x30"sec holds childs pose 3x20"sec holds TrA activation in supine 10x3" multimodal cues. Instructed to add to HEP  Manual therapy: x4mns STM/trigger point release to b/l posterior hips, improved soft tissue mobility, and decreased sensitivity.    Patient response to treatment: patient demonstrated improved technique with exercises with repeated demonstration and VC. Patient reports that she felt looser and "good" at the end of session.     PT Education - 01/28/18 1747    Education Details  therex technique/form    Person(s) Educated  Patient    Methods  Demonstration;Explanation    Comprehension  Verbalized understanding       PT Short Term Goals - 01/23/18 0804      PT SHORT TERM GOAL #1   Title  Patient will be adherent to HEP at least 3x a week to improve functional strength and in prep for self management of condition.    Time  4    Period  Weeks    Status  New    Target Date  02/20/18        PT Long Term Goals - 01/23/18 0808      PT LONG  TERM GOAL #1   Title  Patient will increase BLE gross strength to 4+/5 as to improve functional strength for independent gait, increased standing tolerance and increased ADL ability.    Time  8    Period  Weeks    Status  New    Target Date  03/20/18      PT LONG TERM GOAL #2   Title   Patient will be able to perform household work/chores without increase in symptoms.    Time  8    Period  Weeks    Status  New    Target Date  03/20/18      PT LONG TERM GOAL #3   Title  Patient will be able to tolerate sitting for 1 hr with pain 3/10 or less to promote ability to perform work duties.    Time  8    Period  Weeks    Status  New    Target Date   03/20/18      PT LONG TERM GOAL #4   Title  Patient will improve FOTO score by at least 10 points indicating an increased ability/tolerance to perform functional activities to return patient to PLOF.    Time  8    Period  Weeks    Status  New    Target Date  03/20/18            Plan - 01/28/18 1803    Clinical Impression Statement  Patient demonstrated improvement in symptoms/soft tissue integrity after STM. No complaints during session, multimodal cues needed for proper exercise form/technique.     PT Frequency  2x / week    PT Duration  8 weeks    PT Treatment/Interventions  ADLs/Self Care Home Management;Aquatic Therapy;Electrical Stimulation;Moist Heat;Ultrasound;Traction;Gait training;Stair training;Functional mobility training;Therapeutic activities;Neuromuscular re-education;Balance training;Therapeutic exercise;Patient/family education;Manual techniques;Spinal Manipulations;Joint Manipulations;Dry needling;Scar mobilization;Taping    PT Next Visit Plan  IASTM/STM low back, L hip, stretches, hip strengthening, SI tests/alignment    PT Home Exercise Plan  [email or web address] : piriformis stretch, LTR, bridges       Patient will benefit from skilled therapeutic intervention in order to improve the following deficits and impairments:     Visit Diagnosis: Chronic bilateral low back pain without sciatica     Problem List Patient Active Problem List   Diagnosis Date Noted  . History of blood transfusion 05/02/2016  . DUB (dysfunctional uterine bleeding) 03/31/2016  . Ovarian cyst 03/31/2016  . Anemia 03/30/2016    Lieutenant Diego PT, DPT 6:25 PM,01/28/18 [phone]  [facility] [address] [place], [address] Phone: [phone]   Fax:  [phone]  Name: Jenefer Woerner MRN: [id number] Date of Birth: 04-23-1971

## 2018-01-29 ENCOUNTER — Ambulatory Visit: Payer: BLUE CROSS/BLUE SHIELD

## 2018-01-31 ENCOUNTER — Ambulatory Visit: Payer: BLUE CROSS/BLUE SHIELD

## 2018-01-31 DIAGNOSIS — G8929 Other chronic pain: Secondary | ICD-10-CM

## 2018-01-31 DIAGNOSIS — M545 Low back pain, unspecified: Secondary | ICD-10-CM

## 2018-01-31 NOTE — Therapy (Signed)
[facility] [address] [place], [address] Phone: [phone]   Fax:  [phone]  Physical Therapy Treatment  Patient Details  Name: Sherry Hobbs MRN: [id number] Date of Birth: 10/13/1970 Referring Provider: Elyse Jarvis   Encounter Date: 01/31/2018  PT End of Session - 01/31/18 1649    Visit Number  3    Number of Visits  16    Date for PT Re-Evaluation  03/19/18    Authorization Type  BCBS    PT Start Time  1645    PT Stop Time  1730    PT Time Calculation (min)  45 min    Activity Tolerance  Patient tolerated treatment well    Behavior During Therapy  [facility] for tasks assessed/performed       Past Medical History:  Diagnosis Date  . Anemia   . Depression   . Diabetes mellitus without complication ([place])   . Elevated serum creatinine   . GERD (gastroesophageal reflux disease)    OCC  NO MEDS  . History of blood transfusion 05/02/2016  . Hypertension     Past Surgical History:  Procedure Laterality Date  . DILATION AND CURETTAGE OF UTERUS  05/03/2016   Procedure: DILATATION AND CURETTAGE;  Surgeon: [name], MD;  Location: [facility] ORS;  Service: Gynecology;;  . Murrell Redden & CURRETTAGE/HYSTROSCOPY WITH NOVASURE ABLATION N/A 12/03/2017   Procedure: DILATATION & CURETTAGE/HYSTEROSCOPY WITH MINERVA ABLATION;  Surgeon: [name], MD;  Location: [facility] ORS;  Service: Gynecology;  Laterality: N/A;  MINERVA REP CONTACTED [facility] [phone])    There were no vitals filed for this visit.  Subjective Assessment - 01/31/18 1648    Subjective  Pt reports that she is doing well at the moement but her pain is a little bit higher than typical. She complains of 3/10 pain in her L low back.     Pertinent History  Patient reports that her back pain started about 6 months ago, along with hip pain L>R. Incidious onset. Went to physician when it progressed to hurting on R side and when sitting became  intolerably painful. Reports that she had 1 injection to L hip about 2 months ago which significantly helped with her pain.States that she does have numbness/tingling in b/l feet and complains of restless legs at night (history of diabetes). Overall symptoms have improved. PMH of diabetes, HTN, dilation and curettage /hystroscopy with novasure ablation 12/03/17.     Limitations  Sitting;Walking;Lifting;House hold activities;Standing    How long can you sit comfortably?  1 hr     How long can you stand comfortably?  45 mins    How long can you walk comfortably?  improves symptoms    Patient Stated Goals  pain relief, how to self manage condition    Currently in Pain?  Yes    Pain Score  3     Pain Location  Back    Pain Orientation  Left;Lower    Pain Descriptors / Indicators  Cramping    Pain Onset  More than a month ago          TREATMENT  Ther-ex  Piriformis stretch 30s hold x 3 bilateral, pt localizes stretch to posterior hip; Attempted single and double knee to chest without pt localizing any back pain or stretch so deferred; HS length at 90 bilaterally without reproduction of pain so stretch deferred today; Attempted knee rocking but no pain or stretch felt so deferred today; Single leg  bridgesin supine with verbal cues x 10, less pain than double leg bridges but still painful so ended after one set. Pt demonstrates poor motor control and glut weakness; Hooklying isometric clams 3s hold 2 x 10; Hooklying isometric hip adduction ball squeeze 2 x 10;  Manual therapy STM/trigger point release to b/l posterior hips with focus on superior fibers of glut max due to reproduction of pain, improved soft tissue mobility, and decreased sensitivity.    Pt educated throughout session about proper posture and technique with exercises. Improved exercise technique, movement at target joints, use of target muscles after min to mod verbal, visual, tactile cues.    Pt reports improvement in  pain and movement following session today. She is tender with STM to superior fibers of glut max. Pt reports that her hip pain feels very deep. Upon questioning she reports urinary incontinence of at least 10 years. No reported pelvic pain or painful intercourse but she does have a history of multiple ovarian cysts. Advised pt that if she doesn't improve completely with therapy she might benefit from pelvic therapy assessment as she clearly has pelvic floor weakness given her incontinence. Pt encouraged to continue HEP and follow-up as scheduled.                     PT Short Term Goals - 01/23/18 0804      PT SHORT TERM GOAL #1   Title  Patient will be adherent to HEP at least 3x a week to improve functional strength and in prep for self management of condition.    Time  4    Period  Weeks    Status  New    Target Date  02/20/18        PT Long Term Goals - 01/23/18 0808      PT LONG TERM GOAL #1   Title  Patient will increase BLE gross strength to 4+/5 as to improve functional strength for independent gait, increased standing tolerance and increased ADL ability.    Time  8    Period  Weeks    Status  New    Target Date  03/20/18      PT LONG TERM GOAL #2   Title   Patient will be able to perform household work/chores without increase in symptoms.    Time  8    Period  Weeks    Status  New    Target Date  03/20/18      PT LONG TERM GOAL #3   Title  Patient will be able to tolerate sitting for 1 hr with pain 3/10 or less to promote ability to perform work duties.    Time  8    Period  Weeks    Status  New    Target Date  03/20/18      PT LONG TERM GOAL #4   Title  Patient will improve FOTO score by at least 10 points indicating an increased ability/tolerance to perform functional activities to return patient to PLOF.    Time  8    Period  Weeks    Status  New    Target Date  03/20/18            Plan - 01/31/18 1649    Clinical Impression Statement   Pt reports improvement in pain and movement following session today. She is tender with STM to superior fibers of glut max. Pt reports that her hip pain feels very deep.  Upon questioning she reports urinary incontinence of at least 10 years. No reported pelvic pain or painful intercourse but she does have a history of multiple ovarian cysts. Advised pt that if she doesn't improve completely with therapy she might benefit from pelvic therapy assessment as she clearly has pelvic floor weakness given her incontinence. Pt encouraged to continue HEP and follow-up as scheduled.     Rehab Potential  Good    Clinical Impairments Affecting Rehab Potential  decreased core/LE strength, posutral abnormalities, + motivation    PT Frequency  2x / week    PT Duration  8 weeks    PT Treatment/Interventions  ADLs/Self Care Home Management;Aquatic Therapy;Electrical Stimulation;Moist Heat;Ultrasound;Traction;Gait training;Stair training;Functional mobility training;Therapeutic activities;Neuromuscular re-education;Balance training;Therapeutic exercise;Patient/family education;Manual techniques;Spinal Manipulations;Joint Manipulations;Dry needling;Scar mobilization;Taping    PT Next Visit Plan  IASTM/STM low back, L hip, stretches, hip strengthening, SI tests/alignment    PT Home Exercise Plan  [id number] : piriformis stretch, LTR, bridges       Patient will benefit from skilled therapeutic intervention in order to improve the following deficits and impairments:  Decreased endurance, Decreased mobility, Increased muscle spasms, Improper body mechanics, Decreased range of motion, Decreased activity tolerance, Decreased strength, Impaired flexibility, Increased fascial restricitons, Postural dysfunction, Pain  Visit Diagnosis: Chronic bilateral low back pain without sciatica     Problem List Patient Active Problem List   Diagnosis Date Noted  . History of blood transfusion 05/02/2016  . DUB (dysfunctional uterine  bleeding) 03/31/2016  . Ovarian cyst 03/31/2016  . Anemia 03/30/2016   Phillips Grout PT, DPT, GCS  Huprich,Jason 02/01/2018, 8:54 AM  [facility] [address] [place], [address] Phone: [phone]   Fax:  [phone]  Name: Sherry Hobbs MRN: [id number] Date of Birth: 02/21/1971

## 2018-02-04 ENCOUNTER — Ambulatory Visit: Payer: BLUE CROSS/BLUE SHIELD

## 2018-02-04 DIAGNOSIS — M545 Low back pain: Secondary | ICD-10-CM | POA: Diagnosis not present

## 2018-02-04 DIAGNOSIS — G8929 Other chronic pain: Secondary | ICD-10-CM

## 2018-02-04 NOTE — Therapy (Signed)
[place] [facility] [address] [place], [address] Phone: [phone]   Fax:  [phone]  Physical Therapy Treatment  Patient Details  Name: Sherry Hobbs MRN: [id number] Date of Birth: 02/25/1971 Referring Provider: Elyse Jarvis   Encounter Date: 02/04/2018  PT End of Session - 02/04/18 1753    Visit Number  4    Number of Visits  16    Date for PT Re-Evaluation  03/19/18    Authorization Type  BCBS    PT Start Time  1751    PT Stop Time  1827    PT Time Calculation (min)  36 min    Activity Tolerance  Patient tolerated treatment well    Behavior During Therapy  [facility] for tasks assessed/performed       Past Medical History:  Diagnosis Date  . Anemia   . Depression   . Diabetes mellitus without complication ([place])   . Elevated serum creatinine   . GERD (gastroesophageal reflux disease)    OCC  NO MEDS  . History of blood transfusion 05/02/2016  . Hypertension     Past Surgical History:  Procedure Laterality Date  . DILATION AND CURETTAGE OF UTERUS  05/03/2016   Procedure: DILATATION AND CURETTAGE;  Surgeon: [name], MD;  Location: [facility] ORS;  Service: Gynecology;;  . Murrell Redden & CURRETTAGE/HYSTROSCOPY WITH NOVASURE ABLATION N/A 12/03/2017   Procedure: DILATATION & CURETTAGE/HYSTEROSCOPY WITH MINERVA ABLATION;  Surgeon: [name], MD;  Location: [facility]S;  Service: Gynecology;  Laterality: N/A;  MINERVA REP CONTACTED [facility] [phone])    There were no vitals filed for this visit.  Subjective Assessment - 02/04/18 1752    Subjective  Patient reports some soreness and some pain today. States her pain is not really constant.     Currently in Pain?  Yes    Pain Score  2     Pain Location  Back    Pain Orientation  Left;Lower    Pain Descriptors / Indicators  Aching         TREATMENT Ther-ex  MET for hip innominate rotation x31mns Hooklying isometric clams 3s hold 2 x  10; Hooklying isometric hip adduction ball squeeze 2 x 10; Prone hip extension 2x10 b/l Prone hip extension with glute focus (knee bent) 2 x 10  Manual therapy STM/trigger point release to L posterior hips with focus on superior fibers of glut max due to reproduction of pain, improved soft tissue mobility, and decreased sensitivity. Lumbar mobilization in s/l x236ms on L Patient educated about use of tennis ball [facility] educated throughout session about proper posture and technique with exercises. Improved exercise technique with mod verbal/tactile cues     PT Education - 02/04/18 1827    Education Details  therex technique/form    Person(s) Educated  Patient    Methods  Explanation;Demonstration       PT Short Term Goals - 01/23/18 0804      PT SHORT TERM GOAL #1   Title  Patient will be adherent to HEP at least 3x a week to improve functional strength and in prep for self management of condition.    Time  4    Period  Weeks    Status  New    Target Date  02/20/18        PT Long Term Goals - 01/23/18 0808      PT LONG TERM GOAL #1   Title  Patient will increase BLE gross strength to 4+/5 as to improve functional strength for independent gait, increased standing tolerance and increased ADL ability.    Time  8    Period  Weeks    Status  New    Target Date  03/20/18      PT LONG TERM GOAL #2   Title   Patient will be able to perform household work/chores without increase in symptoms.    Time  8    Period  Weeks    Status  New    Target Date  03/20/18      PT LONG TERM GOAL #3   Title  Patient will be able to tolerate sitting for 1 hr with pain 3/10 or less to promote ability to perform work duties.    Time  8    Period  Weeks    Status  New    Target Date  03/20/18      PT LONG TERM GOAL #4   Title  Patient will improve FOTO score by at least 10 points indicating an increased ability/tolerance to perform functional activities to return patient  to PLOF.    Time  8    Period  Weeks    Status  New    Target Date  03/20/18            Plan - 02/04/18 1827    Clinical Impression Statement  Patient reports some soreness in hip after session especially STM. Patient educated about use of R shoe insert to assess leg length descrepancy response.     PT Frequency  2x / week    PT Duration  8 weeks    PT Treatment/Interventions  ADLs/Self Care Home Management;Aquatic Therapy;Electrical Stimulation;Moist Heat;Ultrasound;Traction;Gait training;Stair training;Functional mobility training;Therapeutic activities;Neuromuscular re-education;Balance training;Therapeutic exercise;Patient/family education;Manual techniques;Spinal Manipulations;Joint Manipulations;Dry needling;Scar mobilization;Taping    PT Home Exercise Plan  [id number] : piriformis stretch, LTR, bridges, clamshells    Consulted and Agree with Plan of Care  Patient       Patient will benefit from skilled therapeutic intervention in order to improve the following deficits and impairments:  Decreased endurance, Decreased mobility, Increased muscle spasms, Improper body mechanics, Decreased range of motion, Decreased activity tolerance, Decreased strength, Impaired flexibility, Increased fascial restricitons, Postural dysfunction, Pain  Visit Diagnosis: Chronic bilateral low back pain without sciatica     Problem List Patient Active Problem List   Diagnosis Date Noted  . History of blood transfusion 05/02/2016  . DUB (dysfunctional uterine bleeding) 03/31/2016  . Ovarian cyst 03/31/2016  . Anemia 03/30/2016   Lieutenant Diego PT, DPT 6:30 PM,02/04/18 [place] [facility] [address] [place], [address] Phone: [phone]   Fax:  [phone]  Name: Sherry Hobbs MRN: [id number] Date of Birth: 1971/03/02

## 2018-02-07 ENCOUNTER — Ambulatory Visit: Payer: BLUE CROSS/BLUE SHIELD

## 2018-02-07 DIAGNOSIS — M545 Low back pain: Secondary | ICD-10-CM | POA: Diagnosis not present

## 2018-02-07 DIAGNOSIS — G8929 Other chronic pain: Secondary | ICD-10-CM

## 2018-02-07 NOTE — Therapy (Signed)
[place] [facility] [address] [place], [address] Phone: [phone]   Fax:  [phone]  Physical Therapy Treatment  Patient Details  Name: Sherry Hobbs MRN: [id number] Date of Birth: 05-01-1971 Referring Provider (PT): Elyse Jarvis   Encounter Date: 02/07/2018  PT End of Session - 02/07/18 1754    Visit Number  5    Number of Visits  16    Date for PT Re-Evaluation  03/19/18    Authorization Type  BCBS    PT Start Time  1324    PT Stop Time  1813    PT Time Calculation (min)  38 min    Activity Tolerance  Patient tolerated treatment well    Behavior During Therapy  [facility] for tasks assessed/performed       Past Medical History:  Diagnosis Date  . Anemia   . Depression   . Diabetes mellitus without complication ([place])   . Elevated serum creatinine   . GERD (gastroesophageal reflux disease)    OCC  NO MEDS  . History of blood transfusion 05/02/2016  . Hypertension     Past Surgical History:  Procedure Laterality Date  . DILATION AND CURETTAGE OF UTERUS  05/03/2016   Procedure: DILATATION AND CURETTAGE;  Surgeon: [name], MD;  Location: [facility];  Service: Gynecology;;  . Murrell Redden & CURRETTAGE/HYSTROSCOPY WITH NOVASURE ABLATION N/A 12/03/2017   Procedure: DILATATION & CURETTAGE/HYSTEROSCOPY WITH MINERVA ABLATION;  Surgeon: [name], MD;  Location: [facility] OR[facility];  Service: Gynecology;  Laterality: N/A;  MINERVA REP CONTACTED [facility] [phone])    There were no vitals filed for this visit.  Subjective Assessment - 02/07/18 1752    Subjective  Patient reports mild muscle soreness at start of session but none of her concordant pain.    Currently in Pain?  No/denies      TREATMENT Ther-ex Hooklying isometric clams 3s hold 2 x 10; S/L hip abduction x10  B/l with tactile cues throughout Prone hip extension 2x10 b/l Single leg bridge (opposite knee to chest) 2 x5 Prone hip extension  with glute focus (knee bent) 2 x 10 Supine hip flexor stretch 3x30s on L Prone press ups on hands x10  Manual therapy STM/trigger point release to L posterior hipwith focus on superior fibers of glut max due to reproduction of pain, improved soft tissue mobility, and decreased sensitivity. Lumbar mobilization in prone, CPA grade II/IV 2 bouts of 30secs per level, T10-L5  Pt educated throughout session about proper posture and technique with exercises. Improved exercise technique with mod verbal/tactile cues     PT Education - 02/07/18 1753    Education Details  therex technique/form    Person(s) Educated  Patient    Methods  Explanation;Demonstration    Comprehension  Verbalized understanding;Returned demonstration       PT Short Term Goals - 01/23/18 0804      PT SHORT TERM GOAL #1   Title  Patient will be adherent to HEP at least 3x a week to improve functional strength and in prep for self management of condition.    Time  4    Period  Weeks    Status  New    Target Date  02/20/18        PT Long Term Goals - 01/23/18 0808      PT LONG TERM GOAL #1   Title  Patient will increase BLE gross strength to 4+/5 as to improve functional strength for independent  gait, increased standing tolerance and increased ADL ability.    Time  8    Period  Weeks    Status  New    Target Date  03/20/18      PT LONG TERM GOAL #2   Title   Patient will be able to perform household work/chores without increase in symptoms.    Time  8    Period  Weeks    Status  New    Target Date  03/20/18      PT LONG TERM GOAL #3   Title  Patient will be able to tolerate sitting for 1 hr with pain 3/10 or less to promote ability to perform work duties.    Time  8    Period  Weeks    Status  New    Target Date  03/20/18      PT LONG TERM GOAL #4   Title  Patient will improve FOTO score by at least 10 points indicating an increased ability/tolerance to perform functional activities to return  patient to PLOF.    Time  8    Period  Weeks    Status  New    Target Date  03/20/18            Plan - 02/07/18 1753    Clinical Impression Statement  Patient had good tolerance to activity today. Moderate complaints of muscle fatigue but no complaints of pain. Soft tissue integrity improved by 50% post STM. Patient reports no pain at end of session and states she feels "looser".    PT Frequency  2x / week    PT Duration  8 weeks    PT Treatment/Interventions  ADLs/Self Care Home Management;Aquatic Therapy;Electrical Stimulation;Moist Heat;Ultrasound;Traction;Gait training;Stair training;Functional mobility training;Therapeutic activities;Neuromuscular re-education;Balance training;Therapeutic exercise;Patient/family education;Manual techniques;Spinal Manipulations;Joint Manipulations;Dry needling;Scar mobilization;Taping    Consulted and Agree with Plan of Care  Patient       Patient will benefit from skilled therapeutic intervention in order to improve the following deficits and impairments:  Decreased endurance, Decreased mobility, Increased muscle spasms, Improper body mechanics, Decreased range of motion, Decreased activity tolerance, Decreased strength, Impaired flexibility, Increased fascial restricitons, Postural dysfunction, Pain  Visit Diagnosis: Chronic bilateral low back pain without sciatica     Problem List Patient Active Problem List   Diagnosis Date Noted  . History of blood transfusion 05/02/2016  . DUB (dysfunctional uterine bleeding) 03/31/2016  . Ovarian cyst 03/31/2016  . Anemia 03/30/2016    Lieutenant Diego PT, DPT 6:16 PM,02/07/18 [phone]  [place] [facility] [address] [place], [address] Phone: [phone]   Fax:  [phone]  Name: Sherry Hobbs MRN: [id number] Date of Birth: 12-04-1970

## 2018-02-11 ENCOUNTER — Ambulatory Visit: Payer: BLUE CROSS/BLUE SHIELD

## 2018-02-11 DIAGNOSIS — M545 Low back pain, unspecified: Secondary | ICD-10-CM

## 2018-02-11 DIAGNOSIS — G8929 Other chronic pain: Secondary | ICD-10-CM

## 2018-02-11 NOTE — Therapy (Signed)
[facility] [address] [place], [address] Phone: [phone]   Fax:  [phone]  Physical Therapy Treatment  Patient Details  Name: Sherry Hobbs MRN: [id number] Date of Birth: Oct 22, 1970 Referring Provider (PT): Elyse Jarvis   Encounter Date: 02/11/2018  PT End of Session - 02/11/18 1750    Visit Number  6    Number of Visits  16    Date for PT Re-Evaluation  03/19/18    Authorization Type  BC[employer]    PT Start Time  8916    PT Stop Time  1830    PT Time Calculation (min)  44 min    Activity Tolerance  Patient tolerated treatment well    Behavior During Therapy  [facility] for tasks assessed/performed       Past Medical History:  Diagnosis Date  . Anemia   . Depression   . Diabetes mellitus without complication ([place])   . Elevated serum creatinine   . GERD (gastroesophageal reflux disease)    OCC  NO MEDS  . History of blood transfusion 05/02/2016  . Hypertension     Past Surgical History:  Procedure Laterality Date  . DILATION AND CURETTAGE OF UTERUS  05/03/2016   Procedure: DILATATION AND CURETTAGE;  Surgeon: [name], MD;  Location: [facility] ORS;  Service: Gynecology;;  . Murrell Redden & CURRETTAGE/HYSTROSCOPY WITH NOVASURE ABLATION N/A 12/03/2017   Procedure: DILATATION & CURETTAGE/HYSTEROSCOPY WITH MINERVA ABLATION;  Surgeon: [name], MD;  Location: [facility] ORS;  Service: Gynecology;  Laterality: N/A;  MINERVA REP CONTACTED [facility] [phone])    There were no vitals filed for this visit.  Subjective Assessment - 02/11/18 1749    Subjective  Patient reports that she went to a festival this weekend and was on her feet for several hours and walked for a several hours. States she had some pain the next day in the AM.     Currently in Pain?  No/denies        TREATMENT Ther-ex Nustep level 5 x 43mns (unbilled) MET for pelvic alignment (L anterior innominate rotation) 3 bouts of 5sec  holds in varying degrees of hip/knee flexion Hooklying clams with YTB 2 x 10; Prone hip extension 2x10 b/l Seated RTB hamstring curls with PT assist 2x10 b/l Seated L hamstring isometric with PT in varying degrees of knee flexion 3 bouts of 3 positions x 5 s holds Single leg bridge (opposite knee to chest) 2 x5 Prone hip extension with glute focus (knee bent) 2 x 10 Supine hip flexor stretch 3x30s on L Prone press ups on hands 2x10 Standing hip extension/abduction with YTB around ankles 2x10 b/l   Pt educated throughout session about proper posture and technique with exercises. Improved exercise techniquewith mod verbal/tactile cues. Patient reports that she felt better at end of session than she did earlier in the day.     PT Education - 02/11/18 1750    Education Details  therex technique/form    Person(s) Educated  Patient    Methods  Explanation;Demonstration    Comprehension  Verbalized understanding;Returned demonstration       PT Short Term Goals - 01/23/18 0804      PT SHORT TERM GOAL #1   Title  Patient will be adherent to HEP at least 3x a week to improve functional strength and in prep for self management of condition.    Time  4    Period  Weeks    Status  New  Target Date  02/20/18        PT Long Term Goals - 01/23/18 0808      PT LONG TERM GOAL #1   Title  Patient will increase BLE gross strength to 4+/5 as to improve functional strength for independent gait, increased standing tolerance and increased ADL ability.    Time  8    Period  Weeks    Status  New    Target Date  03/20/18      PT LONG TERM GOAL #2   Title   Patient will be able to perform household work/chores without increase in symptoms.    Time  8    Period  Weeks    Status  New    Target Date  03/20/18      PT LONG TERM GOAL #3   Title  Patient will be able to tolerate sitting for 1 hr with pain 3/10 or less to promote ability to perform work duties.    Time  8    Period  Weeks     Status  New    Target Date  03/20/18      PT LONG TERM GOAL #4   Title  Patient will improve FOTO score by at least 10 points indicating an increased ability/tolerance to perform functional activities to return patient to PLOF.    Time  8    Period  Weeks    Status  New    Target Date  03/20/18            Plan - 02/11/18 1825    Clinical Impression Statement  Patient presented with L anterior innominate rotation this session, improvement noted after MET, cavitation noted during MET. Patient also reported improvement in symptoms after MET. No complaints of pain during session just mild fatigue especially with L glute activation.     PT Frequency  2x / week    PT Duration  8 weeks    PT Treatment/Interventions  ADLs/Self Care Home Management;Aquatic Therapy;Electrical Stimulation;Moist Heat;Ultrasound;Traction;Gait training;Stair training;Functional mobility training;Therapeutic activities;Neuromuscular re-education;Balance training;Therapeutic exercise;Patient/family education;Manual techniques;Spinal Manipulations;Joint Manipulations;Dry needling;Scar mobilization;Taping    PT Next Visit Plan  IASTM/STM low back, L hip, stretches, hip strengthening, SI tests/alignment    PT Home Exercise Plan  [id number] : piriformis stretch, LTR, bridges, clamshells       Patient will benefit from skilled therapeutic intervention in order to improve the following deficits and impairments:  Decreased endurance, Decreased mobility, Increased muscle spasms, Improper body mechanics, Decreased range of motion, Decreased activity tolerance, Decreased strength, Impaired flexibility, Increased fascial restricitons, Postural dysfunction, Pain  Visit Diagnosis: Chronic bilateral low back pain without sciatica     Problem List Patient Active Problem List   Diagnosis Date Noted  . History of blood transfusion 05/02/2016  . DUB (dysfunctional uterine bleeding) 03/31/2016  . Ovarian cyst 03/31/2016  .  Anemia 03/30/2016   Lieutenant Diego PT, DPT 6:28 PM,02/11/18 [facility] [address] [place], [address] Phone: [phone]   Fax:  [phone]  Name: Sherry Hobbs MRN: [id number] Date of Birth: 12-18-1970

## 2018-02-13 ENCOUNTER — Ambulatory Visit: Payer: BLUE CROSS/BLUE SHIELD

## 2018-02-20 ENCOUNTER — Ambulatory Visit: Payer: BLUE CROSS/BLUE SHIELD | Attending: Family Medicine

## 2018-02-20 DIAGNOSIS — M545 Low back pain: Secondary | ICD-10-CM | POA: Insufficient documentation

## 2018-02-20 DIAGNOSIS — G8929 Other chronic pain: Secondary | ICD-10-CM | POA: Diagnosis present

## 2018-02-20 NOTE — Therapy (Signed)
[place] [facility] [address] [place], [address] Phone: [phone]   Fax:  [phone]  Physical Therapy Treatment  Patient Details  Name: Sherry Hobbs MRN: [id number] Date of Birth: 1970-09-27 Referring Provider (PT): Elyse Jarvis   Encounter Date: 02/20/2018  PT End of Session - 02/20/18 1731    Visit Number  7    Number of Visits  16    Date for PT Re-Evaluation  03/19/18    Authorization Type  BCBS    PT Start Time  6720    PT Stop Time  1815    PT Time Calculation (min)  42 min    Activity Tolerance  Patient tolerated treatment well;Patient limited by pain    Behavior During Therapy  [facility] for tasks assessed/performed       Past Medical History:  Diagnosis Date  . Anemia   . Depression   . Diabetes mellitus without complication ([place])   . Elevated serum creatinine   . GERD (gastroesophageal reflux disease)    OCC  NO MEDS  . History of blood transfusion 05/02/2016  . Hypertension     Past Surgical History:  Procedure Laterality Date  . DILATION AND CURETTAGE OF UTERUS  05/03/2016   Procedure: DILATATION AND CURETTAGE;  Surgeon: [name], MD;  Location: [facility] ORS;  Service: Gynecology;;  . Murrell Redden & CURRETTAGE/HYSTROSCOPY WITH NOVASURE ABLATION N/A 12/03/2017   Procedure: DILATATION & CURETTAGE/HYSTEROSCOPY WITH MINERVA ABLATION;  Surgeon: [name], MD;  Location: [facility] ORS;  Service: Gynecology;  Laterality: N/A;  MINERVA REP CONTACTED [facility] [phone])    There were no vitals filed for this visit.  Subjective Assessment - 02/20/18 1734    Subjective  Pt reports 10/10 pain in the left glute described as a cramp (laughing and ambulating WFL). States her left glute pain has been present since this morning. States she does her HEP each night, no questions, no increased pain, continue to be challenging. Pt states she has been standing about 35% of the time, which may by a bit  more than usual. She reports using the heel lift in her regular shoes and states she feels better when she uses it. Has not used in the past 2 days.      Limitations  Sitting;Walking;Lifting;House hold activities;Standing    Currently in Pain?  Yes    Pain Score  10-Worst pain ever    Pain Location  Buttocks    Pain Orientation  Left    Pain Descriptors / Indicators  Cramping;Stabbing    Pain Type  Chronic pain    Pain Onset  More than a month ago   States cramping started this morning and present today.    Pain Frequency  Intermittent      Objective: apparent leg length discrepancy L longer and R shorter noted at start of visit in supine position that improved following MET.   Manual therapy: to reduce tissue tension and pain to improve soft tissue flexibility, ROM, and ability to complete functional activities without limitation. Required cuing for relaxation and breathing.  STM with trigger point release to superior and proximal left glute max, glute med to reduce tension and pain.  CPA to L4 and L5, grade III to reduce pain and improve motion.   Ther-ex: to improve ROM, strength, pain, flexibility to allow completion of functional activities. Required physical assistance from PT to complete MET, multimodal cuing for proper sequencing, motor control, form and execution of exercises  in therapeutic manner.  MET for pelvic alignment (L anterior/R posterior innominate rotation) 3 bouts of 5sec holds in varying degrees of hip/knee flexion Hooklying single knee to opposite shoulder piriformis stretch, L side only, 3x30s Hooklying double leg bridge, 2x10 Hooklying hip adduction against theraball, 2x10 Hooklying isometric hip abduction against strap, 5 second holds, 2x10 Supine hip flexor stretch with strap 3x30s on L Prone press up on hands, 2x10 Prone press up on hands with PT OP at L4 x 8  Pt educated throughout session about proper posture and technique with exercises. Improved exercise  techniquewith mod verbal/tactile cues. Patient reports that she felt better at end of session than she did earlier in the day.   Response to skilled treatment: Started with MET and manual therapy at start of visit due to report of high level of pain. Patient reports decrease in pain following MET to 8/10 with less tension. Noted for reduced apparent leg length discrepancy following MET. She tolerated manual therapy well, reporting it relived some of her pain. Pt advised to discontinue strengthening exercises for a few days but continue with stretches and self-mobilization due to reported increase in pain. Patient was able to complete exercises without lasting discomfort. Patient reported improvement in pain to 6/10 while ambulating by end of session and 0/10 pain laying relaxed in prone. Pt reported catching pain in the left hip during descent of prone press up with clinician overpressure and reported CPAs felt good while they were being performed.     PT Education - 02/20/18 1823    Education Details  therex technique/form/HEP modification    Person(s) Educated  Patient    Methods  Explanation;Tactile cues;Verbal cues    Comprehension  Verbalized understanding;Returned demonstration       PT Short Term Goals - 02/20/18 1750      PT SHORT TERM GOAL #1   Title  Patient will be adherent to HEP at least 3x a week to improve functional strength and in prep for self management of condition.    Baseline  Pt reports she is performing some of her HEP daily.     Time  4    Period  Weeks    Status  Achieved    Target Date  02/20/18        PT Long Term Goals - 01/23/18 0808      PT LONG TERM GOAL #1   Title  Patient will increase BLE gross strength to 4+/5 as to improve functional strength for independent gait, increased standing tolerance and increased ADL ability.    Time  8    Period  Weeks    Status  New    Target Date  03/20/18      PT LONG TERM GOAL #2   Title   Patient will be able  to perform household work/chores without increase in symptoms.    Time  8    Period  Weeks    Status  New    Target Date  03/20/18      PT LONG TERM GOAL #3   Title  Patient will be able to tolerate sitting for 1 hr with pain 3/10 or less to promote ability to perform work duties.    Time  8    Period  Weeks    Status  New    Target Date  03/20/18      PT LONG TERM GOAL #4   Title  Patient will improve FOTO score by at least 10  points indicating an increased ability/tolerance to perform functional activities to return patient to PLOF.    Time  8    Period  Weeks    Status  New    Target Date  03/20/18        Plan - 02/20/18 1823    Clinical Impression Statement  Started with MET and manual therapy at start of visit due to report of high level of pain. Patient reports decrease in pain following MET to 8/10 with less tension. Noted for reduced apparent leg length discrepancy following MET. She tolerated manual therapy well, reporting it relived some of her pain. Pt advised to discontinue strengthening exercises for a few days but continue with stretches and self-mobilization due to reported increase in pain. Patient was able to complete exercises without lasting discomfort. Patient reported improvement in pain to 6/10 while ambulating by end of session and 0/10 pain laying relaxed in prone. Pt reported catching pain in the left hip during descent of prone press up with clinician overpressure and reported CPAs felt good while they were being performed.     PT Treatment/Interventions  ADLs/Self Care Home Management;Aquatic Therapy;Electrical Stimulation;Moist Heat;Ultrasound;Traction;Gait training;Stair training;Functional mobility training;Therapeutic activities;Neuromuscular re-education;Balance training;Therapeutic exercise;Patient/family education;Manual techniques;Spinal Manipulations;Joint Manipulations;Dry needling;Scar mobilization;Taping    PT Next Visit Plan  IASTM/STM low back, L  hip, stretches, hip strengthening, SI tests/alignment, progress strengtheing exercises as tolerated.     PT Home Exercise Plan  [id number] : piriformis stretch, LTR, bridges, clamshells    Consulted and Agree with Plan of Care  Patient       Patient will benefit from skilled therapeutic intervention in order to improve the following deficits and impairments:  Decreased endurance, Decreased mobility, Increased muscle spasms, Improper body mechanics, Decreased range of motion, Decreased activity tolerance, Decreased strength, Impaired flexibility, Increased fascial restricitons, Postural dysfunction, Pain  Visit Diagnosis: Chronic bilateral low back pain without sciatica     Problem List Patient Active Problem List   Diagnosis Date Noted  . History of blood transfusion 05/02/2016  . DUB (dysfunctional uterine bleeding) 03/31/2016  . Ovarian cyst 03/31/2016  . Anemia 03/30/2016    Lieutenant Diego PT, DPT 6:28 PM,02/20/18 [phone]  [facility] [address], [place], [address] Phone: [phone]   Fax:  [phone]  Name: Sherry Hobbs MRN: [id number] Date of Birth: 1970-05-25

## 2018-02-26 ENCOUNTER — Ambulatory Visit: Payer: BLUE CROSS/BLUE SHIELD | Admitting: Physical Therapy

## 2018-02-26 ENCOUNTER — Telehealth: Payer: Self-pay | Admitting: Physical Therapy

## 2018-02-26 NOTE — Telephone Encounter (Signed)
Patient did not show up for her appointment 02/26/18 at [facility] and left voicemail checking on her, informing her of her next appointment time, and requesting a call back.

## 2018-02-28 ENCOUNTER — Ambulatory Visit: Payer: BLUE CROSS/BLUE SHIELD | Admitting: Physical Therapy

## 2018-02-28 ENCOUNTER — Encounter: Payer: Self-pay | Admitting: Physical Therapy

## 2018-02-28 DIAGNOSIS — M545 Low back pain: Secondary | ICD-10-CM | POA: Diagnosis not present

## 2018-02-28 DIAGNOSIS — G8929 Other chronic pain: Secondary | ICD-10-CM

## 2018-02-28 NOTE — Therapy (Signed)
[facility] [address] [place], [address] Phone: [phone]   Fax:  [phone]  Physical Therapy Treatment  Patient Details  Name: Sherry Hobbs MRN: [id number] Date of Birth: May 09, 1971 Referring Provider (PT): Elyse Jarvis   Encounter Date: 02/28/2018  PT End of Session - 02/28/18 1845    Visit Number  8    Number of Visits  16    Date for PT Re-Evaluation  03/19/18    Authorization Type  BCBS    PT Start Time  0258    PT Stop Time  1818    PT Time Calculation (min)  45 min    Activity Tolerance  Patient tolerated treatment well;Patient limited by pain    Behavior During Therapy  [facility] for tasks assessed/performed       Past Medical History:  Diagnosis Date  . Anemia   . Depression   . Diabetes mellitus without complication ([place])   . Elevated serum creatinine   . GERD (gastroesophageal reflux disease)    OCC  NO MEDS  . History of blood transfusion 05/02/2016  . Hypertension     Past Surgical History:  Procedure Laterality Date  . DILATION AND CURETTAGE OF UTERUS  05/03/2016   Procedure: DILATATION AND CURETTAGE;  Surgeon: [name], MD;  Location: [facility] ORS;  Service: Gynecology;;  . Murrell Redden & CURRETTAGE/HYSTROSCOPY WITH NOVASURE ABLATION N/A 12/03/2017   Procedure: DILATATION & CURETTAGE/HYSTEROSCOPY WITH MINERVA ABLATION;  Surgeon: [name], MD;  Location: [facility] ORS;  Service: Gynecology;  Laterality: N/A;  MINERVA REP CONTACTED [facility] [phone])    There were no vitals filed for this visit.  Subjective Assessment - 02/28/18 1732    Subjective  Patient reports she has been very sore for the last few days for no apparent reason. She describes it as the crampy/grabbing feeling in her usual area of pain. She put biofreeze on it last night and it feels better today.  HEP is going well and are helping. She continues to state she feels like she can't move when it "catches"  Pt states she gets temporary relief after PT, but it does not last.    Pertinent History  Patient reports that her back pain started about 6 months ago, along with hip pain L>R. Incidious onset. Went to physician when it progressed to hurting on R side and when sitting became intolerably painful. Reports that she had 1 injection to L hip about 2 months ago which significantly helped with her pain.States that she does have numbness/tingling in b/l feet and complains of restless legs at night (history of diabetes). Overall symptoms have improved. PMH of diabetes, HTN, dilation and curettage /hystroscopy with novasure ablation 12/03/17.     Limitations  Sitting;Walking;Lifting;House hold activities;Standing    How long can you sit comfortably?  1 hr     How long can you stand comfortably?  45 mins    How long can you walk comfortably?  improves symptoms    Patient Stated Goals  pain relief, how to self manage condition    Currently in Pain?  Yes    Pain Score  2     Pain Location  Buttocks    Pain Orientation  Left    Pain Descriptors / Indicators  Cramping;Aching;Dull    Pain Type  Chronic pain    Pain Onset  More than a month ago   States cramping started this morning and present today.    Pain  Frequency  Intermittent         Objective: apparent leg length discrepancy L longer and R shorter in supine noted at start of visit in supine position that improved following MET.   Manual therapy: to reduce tissue tension and pain to improve soft tissue flexibility, ROM, and ability to complete functional activities without limitation. Required cuing for relaxation and breathing.  - MET against manual resistance for pelvic alignment (L anterior/R posterior innominate rotation) 3 bouts of 5sec holds in 3 different degrees of hip/knee flexion, two times through with assessment of apparent leg length difference before and after.   Ther-ex: to improve ROM, strength, pain, flexibility to allow completion  of functional activities. Multimodal cuing for proper sequencing, motor control, form and execution of exercises in therapeutic manner. - NuStep level 5 using bilateral upper and lower extremities. Seat setting 8. For improved extremity mobility, muscular endurance, and activity tolerance; and to induce the analgesic effect of aerobic exercise, stimulate improved joint nutrition, and prepare body structures and systems for following interventions. x 5  minutes. Required SBA to get on/off and assistance to set up machine.  - Hooklying single knee to opposite shoulder piriformis stretch, b/l sides, 3x30s - Hooklying clams with RTB x30 - Hooklying double leg bridge with isometric abduction against RTB, x30 - Standing hip extension/abduction with YTB around ankles 2x10 b/l - Prone press up on hands, x15   Response to skilled treatment:  Patient presents with lower pain today. She tolerated all activities well without lasting increase in pain. Noted for reduced apparent leg length discrepancy following MET. Patient was advised to re-start strengthening exercises and her HEP was updated to reflect progressions consistent with her tolerance to exercise at today's visit. Patient required multimodal cuing for correct performance of activities.     PT Education - 02/28/18 1842    Education Details   Education on diagnosis, prognosis, POC, anatomy and physiology of current condition.  Education on HEP including handout. Cuing for proper exercise form.     Person(s) Educated  Patient    Methods  Explanation;Demonstration;Handout    Comprehension  Verbalized understanding;Returned demonstration       PT Short Term Goals - 02/20/18 1750      PT SHORT TERM GOAL #1   Title  Patient will be adherent to HEP at least 3x a week to improve functional strength and in prep for self management of condition.    Baseline  Pt reports she is performing some of her HEP daily.     Time  4    Period  Weeks     Status  Achieved    Target Date  02/20/18        PT Long Term Goals - 01/23/18 0808      PT LONG TERM GOAL #1   Title  Patient will increase BLE gross strength to 4+/5 as to improve functional strength for independent gait, increased standing tolerance and increased ADL ability.    Time  8    Period  Weeks    Status  New    Target Date  03/20/18      PT LONG TERM GOAL #2   Title   Patient will be able to perform household work/chores without increase in symptoms.    Time  8    Period  Weeks    Status  New    Target Date  03/20/18      PT LONG TERM GOAL #3   Title    Patient will be able to tolerate sitting for 1 hr with pain 3/10 or less to promote ability to perform work duties.    Time  8    Period  Weeks    Status  New    Target Date  03/20/18      PT LONG TERM GOAL #4   Title  Patient will improve FOTO score by at least 10 points indicating an increased ability/tolerance to perform functional activities to return patient to PLOF.    Time  8    Period  Weeks    Status  New    Target Date  03/20/18            Plan - 02/28/18 1843    Clinical Impression Statement  Patient presents with lower pain today. She tolerated all activities well without lasting increase in pain. Noted for reduced apparent leg length discrepancy following MET. Patient was advised to re-start strengthening exercises and her HEP was updated to reflect progressions consistent with her tolerance to exercise at today's visit. Patient required multimodal cuing for correct performance of activities.     Rehab Potential  Good    Clinical Impairments Affecting Rehab Potential  decreased core/LE strength, posutral abnormalities, + motivation    PT Frequency  2x / week    PT Duration  8 weeks    PT Treatment/Interventions  ADLs/Self Care Home Management;Aquatic Therapy;Electrical Stimulation;Moist Heat;Ultrasound;Traction;Gait training;Stair training;Functional mobility training;Therapeutic  activities;Neuromuscular re-education;Balance training;Therapeutic exercise;Patient/family education;Manual techniques;Spinal Manipulations;Joint Manipulations;Dry needling;Scar mobilization;Taping    PT Next Visit Plan  IASTM/STM low back, L hip, stretches, hip strengthening, SI tests/alignment, progress strengtheing exercises as tolerated.     PT Home Exercise Plan  [id number] : piriformis stretch, LTR, bridges, clamshells    Consulted and Agree with Plan of Care  Patient       Patient will benefit from skilled therapeutic intervention in order to improve the following deficits and impairments:  Decreased endurance, Decreased mobility, Increased muscle spasms, Improper body mechanics, Decreased range of motion, Decreased activity tolerance, Decreased strength, Impaired flexibility, Increased fascial restricitons, Postural dysfunction, Pain  Visit Diagnosis: Chronic bilateral low back pain without sciatica     Problem List Patient Active Problem List   Diagnosis Date Noted  . History of blood transfusion 05/02/2016  . DUB (dysfunctional uterine bleeding) 03/31/2016  . Ovarian cyst 03/31/2016  . Anemia 03/30/2016    Everlean Alstrom. Graylon Good, PT, DPT 02/28/18, 6:47 PM  [facility] [address] [place], [address] Phone: [phone]   Fax:  [phone]  Name: Sherry Hobbs MRN: [id number] Date of Birth: 1970/09/18

## 2018-03-05 ENCOUNTER — Ambulatory Visit: Payer: BLUE CROSS/BLUE SHIELD | Admitting: Physical Therapy

## 2018-03-05 ENCOUNTER — Encounter: Payer: Self-pay | Admitting: Physical Therapy

## 2018-03-05 DIAGNOSIS — G8929 Other chronic pain: Secondary | ICD-10-CM

## 2018-03-05 DIAGNOSIS — M545 Low back pain, unspecified: Secondary | ICD-10-CM

## 2018-03-05 NOTE — Therapy (Signed)
[facility] [address] [place], [address] Phone: [phone]   Fax:  [phone]  Physical Therapy Treatment  Patient Details  Name: Sherry Hobbs MRN: [id number] Date of Birth: 12/28/1970 Referring Provider (PT): Elyse Jarvis   Encounter Date: 03/05/2018  PT End of Session - 03/05/18 1729    Visit Number  9    Number of Visits  16    Date for PT Re-Evaluation  03/19/18    Authorization Type  BCBS    PT Start Time  1730    PT Stop Time  1810    PT Time Calculation (min)  40 min    Activity Tolerance  Patient tolerated treatment well;Patient limited by pain    Behavior During Therapy  [facility] for tasks assessed/performed       Past Medical History:  Diagnosis Date  . Anemia   . Depression   . Diabetes mellitus without complication ([place])   . Elevated serum creatinine   . GERD (gastroesophageal reflux disease)    OCC  NO MEDS  . History of blood transfusion 05/02/2016  . Hypertension     Past Surgical History:  Procedure Laterality Date  . DILATION AND CURETTAGE OF UTERUS  05/03/2016   Procedure: DILATATION AND CURETTAGE;  Surgeon: [name], MD;  Location: [facility] ORS;  Service: Gynecology;;  . Murrell Redden & CURRETTAGE/HYSTROSCOPY WITH NOVASURE ABLATION N/A 12/03/2017   Procedure: DILATATION & CURETTAGE/HYSTEROSCOPY WITH MINERVA ABLATION;  Surgeon: [name], MD;  Location: [facility] ORS;  Service: Gynecology;  Laterality: N/A;  MINERVA REP CONTACTED [facility] [phone])    There were no vitals filed for this visit.  Subjective Assessment - 03/05/18 1729    Subjective  Patient reports she has not had any pain since Saturday after she helped a friend move (reaching to top shelf and squatting down). States she was a little sore after last PT visit, but nothing out of the ordinary. Able to return to HEP without difficulty and states she feels the standing band exercise from last visit helps a  lot.  She has noticed she is moving a lot more at work this week.     Pertinent History  Patient reports that her back pain started about 6 months ago, along with hip pain L>R. Incidious onset. Went to physician when it progressed to hurting on R side and when sitting became intolerably painful. Reports that she had 1 injection to L hip about 2 months ago which significantly helped with her pain.States that she does have numbness/tingling in b/l feet and complains of restless legs at night (history of diabetes). Overall symptoms have improved. PMH of diabetes, HTN, dilation and curettage /hystroscopy with novasure ablation 12/03/17.     Limitations  Sitting;Walking;Lifting;House hold activities;Standing    How long can you sit comfortably?  1 hr     How long can you stand comfortably?  45 mins    How long can you walk comfortably?  improves symptoms    Patient Stated Goals  pain relief, how to self manage condition    Pain Score  0-No pain    Pain Onset  More than a month ago   States cramping started this morning and present today.      TREATMENT:   Manual therapy: to reduce tissue tension and pain to improve soft tissue flexibility, ROM, and ability to complete functional activities without limitation. Required cuing for relaxation and breathing.  - prone press up with head  of bed elevated and overpressure over sacrum, 3x10, decrease, no better at first; then decrease, better after UPAs.  - left UPAs over L5, grade III-IV, x 30 to reduce pain, decreased, better following.    Therapeutic exercise: to improve ROM, strength, pain, flexibility to allow completion of functional activities. Multimodal cuing for proper sequencing, motor control, form and execution of exercises in therapeutic manner. - NuStep level 5 using bilateral upper and lower extremities. Seat setting 8. For improved extremity mobility, muscular endurance, and activity tolerance; and to induce the analgesic effect of aerobic  exercise, stimulate improved joint nutrition, and prepare body structures and systems for following interventions. x 5  minutes. Required SBA to get on/off and assistance to set up machine. - Standing hip extension/abduction with RTB around ankles 3x15 b/l - Squats with BUE support, focusing on hip hinge, glute activation, and keeping tibias perpendicular to floor, x15 and x 7 before stopping due to start of concurrent symptoms.   - standing repeated lumbar extension, x10 - Prone press up on hands, 3x15, added self overpressure (lock and sag) last 5, at first no effect, but after manual interventions press up with self overpressure abolished pain and remained better.  - trial of seated posture correction with lumbar roll and instruction on how to perform whenever sitting.  - seated repeated lumbar extension with education on when to use to prevent return of symptoms.  - Education on HEP including handout and green theraband.    Response to skilled treatment:  Patient presents with no pain today. She tolerated most activities well. She had return of symptoms with squatting, so this exercise was discontinued. Symptoms remained until patient performed press ups with overpressure, which decreased, no better, then abolished, better with joint mobilization. Pt educated on seated strategies for maintaining lumbar lordosis and HEP was updated with strategies to prevent recurrence of pain while sitting.  Patient required multimodal cuing for correct performance of activities and left with no pain present.  PT Education - 03/05/18 1729    Education Details  Proper exercise form/purpose of exercise, strategies for prevention of return of symptoms and for HEP performance    Person(s) Educated  Patient    Methods  Explanation;Demonstration    Comprehension  Verbalized understanding;Returned demonstration       PT Short Term Goals - 02/20/18 1750      PT SHORT TERM GOAL #1   Title  Patient will be  adherent to HEP at least 3x a week to improve functional strength and in prep for self management of condition.    Baseline  Pt reports she is performing some of her HEP daily.     Time  4    Period  Weeks    Status  Achieved    Target Date  02/20/18        PT Long Term Goals - 01/23/18 0808      PT LONG TERM GOAL #1   Title  Patient will increase BLE gross strength to 4+/5 as to improve functional strength for independent gait, increased standing tolerance and increased ADL ability.    Time  8    Period  Weeks    Status  New    Target Date  03/20/18      PT LONG TERM GOAL #2   Title   Patient will be able to perform household work/chores without increase in symptoms.    Time  8    Period  Weeks    Status  New    Target Date  03/20/18      PT LONG TERM GOAL #3   Title  Patient will be able to tolerate sitting for 1 hr with pain 3/10 or less to promote ability to perform work duties.    Time  8    Period  Weeks    Status  New    Target Date  03/20/18      PT LONG TERM GOAL #4   Title  Patient will improve FOTO score by at least 10 points indicating an increased ability/tolerance to perform functional activities to return patient to PLOF.    Time  8    Period  Weeks    Status  New    Target Date  03/20/18            Plan - 03/05/18 1730    Clinical Impression Statement  Patient presents with no pain today. She tolerated most activities well. She had return of symptoms with squatting, so this exercise was discontinued. Symptoms remained until patient performed press ups with overpressure, which decreased, no better, then abolished, better with joint mobilization. Pt educated on seated strategies for maintaining lumbar lordosis and HEP was updated with strategies to prevent recurrence of pain while sitting.  Patient required multimodal cuing for correct performance of activities and left with no pain present.    Rehab Potential  Good    Clinical Impairments Affecting  Rehab Potential  decreased core/LE strength, posutral abnormalities, + motivation    PT Frequency  2x / week    PT Duration  8 weeks    PT Treatment/Interventions  ADLs/Self Care Home Management;Aquatic Therapy;Electrical Stimulation;Moist Heat;Ultrasound;Traction;Gait training;Stair training;Functional mobility training;Therapeutic activities;Neuromuscular re-education;Balance training;Therapeutic exercise;Patient/family education;Manual techniques;Spinal Manipulations;Joint Manipulations;Dry needling;Scar mobilization;Taping    PT Next Visit Plan  progress strengtheing exercises as tolerated. Continue to assess repeated motions and directional preference and adjust treatment accordingly. Formal progress assessment next visit.     PT Home Exercise Plan  [id number] : piriformis stretch, LTR, bridges, clamshells    Consulted and Agree with Plan of Care  Patient       Patient will benefit from skilled therapeutic intervention in order to improve the following deficits and impairments:  Decreased endurance, Decreased mobility, Increased muscle spasms, Improper body mechanics, Decreased range of motion, Decreased activity tolerance, Decreased strength, Impaired flexibility, Increased fascial restricitons, Postural dysfunction, Pain  Visit Diagnosis: Chronic bilateral low back pain without sciatica     Problem List Patient Active Problem List   Diagnosis Date Noted  . History of blood transfusion 05/02/2016  . DUB (dysfunctional uterine bleeding) 03/31/2016  . Ovarian cyst 03/31/2016  . Anemia 03/30/2016   Everlean Alstrom. Graylon Good, PT, DPT 03/05/2018, 6:33 PM  [facility] [address], [place], [address] Phone: [phone]   Fax:  [phone]  Name: Sherry Hobbs MRN: [id number] Date of Birth: October 27, 1970

## 2018-03-14 ENCOUNTER — Ambulatory Visit: Payer: BLUE CROSS/BLUE SHIELD | Admitting: Physical Therapy

## 2018-03-14 ENCOUNTER — Telehealth: Payer: Self-pay | Admitting: Physical Therapy

## 2018-03-14 NOTE — Telephone Encounter (Signed)
Called patient at home after she did not show up for her appointment today at 2:30pm. No answer. Left VM letting pt know she missed her appointment and informing her of her next appointment.

## 2018-03-19 ENCOUNTER — Ambulatory Visit: Payer: BLUE CROSS/BLUE SHIELD | Admitting: Physical Therapy

## 2018-03-21 ENCOUNTER — Ambulatory Visit: Payer: BLUE CROSS/BLUE SHIELD | Attending: Family Medicine | Admitting: Physical Therapy

## 2018-03-21 ENCOUNTER — Telehealth: Payer: Self-pay | Admitting: Physical Therapy

## 2018-03-21 NOTE — Telephone Encounter (Signed)
Pt no-showed to today's appt at 2:45pm. No answer. Left VM lettinger her know this is second no show, requesting call back, and informing her of her next appt Mon 03/25/18 at 2:45pm. Call back number: [phone]

## 2018-03-25 ENCOUNTER — Telehealth: Payer: Self-pay | Admitting: Physical Therapy

## 2018-03-25 ENCOUNTER — Ambulatory Visit: Payer: BLUE CROSS/BLUE SHIELD | Admitting: Physical Therapy

## 2018-03-25 ENCOUNTER — Encounter: Payer: Self-pay | Admitting: Physical Therapy

## 2018-03-25 DIAGNOSIS — M545 Low back pain: Principal | ICD-10-CM

## 2018-03-25 DIAGNOSIS — G8929 Other chronic pain: Secondary | ICD-10-CM

## 2018-03-25 NOTE — Therapy (Signed)
[facility] [address], [place], [address] Phone: [phone]   Fax:  [phone]  Physical Therapy Discharge Summary Reporting Period: 01/22/2018 through 03/25/2018  Patient Details  Name: Sherry Hobbs MRN: [id number] Date of Birth: [birth year] Referring Provider (PT): Elyse Jarvis   Encounter Date: 03/25/2018    Past Medical History:  Diagnosis Date  . Anemia   . Depression   . Diabetes mellitus without complication ([place])   . Elevated serum creatinine   . GERD (gastroesophageal reflux disease)    OCC  NO MEDS  . History of blood transfusion 05/02/2016  . Hypertension     Past Surgical History:  Procedure Laterality Date  . DILATION AND CURETTAGE OF UTERUS  05/03/2016   Procedure: DILATATION AND CURETTAGE;  Surgeon: [name], MD;  Location: [facility] ORS;  Service: Gynecology;;  . Murrell Redden & CURRETTAGE/HYSTROSCOPY WITH NOVASURE ABLATION N/A 12/03/2017   Procedure: DILATATION & CURETTAGE/HYSTEROSCOPY WITH MINERVA ABLATION;  Surgeon: [name], MD;  Location: [facility] ORS;  Service: Gynecology;  Laterality: N/A;  MINERVA REP CONTACTED [facility] [phone])    There were no vitals filed for this visit.  Subjective Assessment - 03/25/18 1521    Subjective  Upon discharge, pt did not return for continued physical therapy treatment sessions after her 9th visit. At that time she stated she was feeling well and had some improvement in pain and funciton  and was able to complete her HEP without difficulty. She has been unresponsive to 3 attempts to call her back after her last 3 consecutive missed visits.         PT Short Term Goals - 02/20/18 1750      PT SHORT TERM GOAL #1   Title  Patient will be adherent to HEP at least 3x a week to improve functional strength and in prep for self management of condition.    Baseline  Pt reports she is performing some of her HEP daily.     Time  4    Period  Weeks    Status  Achieved    Target Date  02/20/18        PT Long Term Goals - 03/25/18 1526      PT LONG TERM GOAL #1   Title  Patient will increase BLE gross strength to 4+/5 as to improve functional strength for independent gait, increased standing tolerance and increased ADL ability.    Time  8    Period  Weeks    Status  Unable to assess    Target Date  03/20/18      PT LONG TERM GOAL #2   Title   Patient will be able to perform household work/chores without increase in symptoms.    Baseline  reported being able to help someone move without increased pain.    Time  8    Period  Weeks    Status  Partially Met    Target Date  03/20/18      PT LONG TERM GOAL #3   Title  Patient will be able to tolerate sitting for 1 hr with pain 3/10 or less to promote ability to perform work duties.    Baseline  reported able to sit 1 hour.     Time  8    Period  Weeks    Status  Achieved    Target Date  03/20/18      PT LONG TERM GOAL #4   Title  Patient  will improve FOTO score by at least 10 points indicating an increased ability/tolerance to perform functional activities to return patient to PLOF.    Time  8    Period  Weeks    Status  Unable to assess    Target Date  03/20/18            Plan - 03/25/18 1531    Clinical Impression Statement  Patient attended 9 physical therapy treatment sessions and was making progress towards stated goals until she stopped attending or responding to attempts to contact her. Over the course of her care, she met her short term goal and some of her long term goals. Some goals were unable to be re-assessed due to lack of attendance. She is now discharged from physical therapy due to lack of participation.    Rehab Potential  Good    Clinical Impairments Affecting Rehab Potential  decreased core/LE strength, posutral abnormalities, + motivation    PT Frequency  2x / week    PT Duration  8 weeks    PT Treatment/Interventions  ADLs/Self  Care Home Management;Aquatic Therapy;Electrical Stimulation;Moist Heat;Ultrasound;Traction;Gait training;Stair training;Functional mobility training;Therapeutic activities;Neuromuscular re-education;Balance training;Therapeutic exercise;Patient/family education;Manual techniques;Spinal Manipulations;Joint Manipulations;Dry needling;Scar mobilization;Taping    PT Next Visit Plan  Pt is now discharged due to lack of participation in care.     PT Home Exercise Plan  [id number] : piriformis stretch, LTR, bridges, clamshells    Consulted and Agree with Plan of Care  Patient       Patient will benefit from skilled therapeutic intervention in order to improve the following deficits and impairments:  Decreased endurance, Decreased mobility, Increased muscle spasms, Improper body mechanics, Decreased range of motion, Decreased activity tolerance, Decreased strength, Impaired flexibility, Increased fascial restricitons, Postural dysfunction, Pain  Visit Diagnosis: Chronic bilateral low back pain without sciatica     Problem List Patient Active Problem List   Diagnosis Date Noted  . History of blood transfusion 05/02/2016  . DUB (dysfunctional uterine bleeding) 03/31/2016  . Ovarian cyst 03/31/2016  . Anemia 03/30/2016    Nancy Nordmann , PT, DPT 03/25/2018, 3:31 PM  [facility] [address], [place], [address] Phone: [phone]   Fax:  [phone]  Name: Sherry Hobbs MRN: [id number] Date of Birth: [birth year]

## 2018-03-25 NOTE — Telephone Encounter (Signed)
Called pt after she did not show up for today's appointment. Left voicemail stated that this was her 3rd consecutive no-show and that I was planning to close her episode of care and remove the rest of her appointments from the schedule. I wished her well and stated we would be happy to work with her again if she should need our services in the future. I left call back number: [phone]. Requested front office staff remove her future appts from schedule.

## 2018-03-27 ENCOUNTER — Ambulatory Visit: Payer: BLUE CROSS/BLUE SHIELD | Admitting: Physical Therapy

## 2018-04-01 ENCOUNTER — Ambulatory Visit: Payer: BLUE CROSS/BLUE SHIELD | Admitting: Physical Therapy

## 2018-04-03 ENCOUNTER — Encounter: Payer: BLUE CROSS/BLUE SHIELD | Admitting: Physical Therapy

## 2018-04-09 ENCOUNTER — Encounter: Payer: BLUE CROSS/BLUE SHIELD | Admitting: Physical Therapy

## 2018-04-15 ENCOUNTER — Encounter: Payer: BLUE CROSS/BLUE SHIELD | Admitting: Physical Therapy

## 2018-04-18 ENCOUNTER — Other Ambulatory Visit: Payer: Self-pay | Admitting: Family Medicine

## 2018-04-18 DIAGNOSIS — M545 Low back pain, unspecified: Secondary | ICD-10-CM

## 2018-05-04 ENCOUNTER — Ambulatory Visit
Admission: RE | Admit: 2018-05-04 | Discharge: 2018-05-04 | Disposition: A | Discharge status: Home / Self Care | Payer: BLUE CROSS/BLUE SHIELD | Source: Ambulatory Visit | Attending: Family Medicine | Admitting: Family Medicine

## 2018-05-04 DIAGNOSIS — M545 Low back pain, unspecified: Secondary | ICD-10-CM

## 2018-05-04 DIAGNOSIS — M48061 Spinal stenosis, lumbar region without neurogenic claudication: Secondary | ICD-10-CM | POA: Diagnosis not present

## 2018-05-04 DIAGNOSIS — M5127 Other intervertebral disc displacement, lumbosacral region: Secondary | ICD-10-CM | POA: Diagnosis not present

## 2018-05-04 DIAGNOSIS — G8929 Other chronic pain: Secondary | ICD-10-CM | POA: Insufficient documentation

## 2018-05-04 DIAGNOSIS — M5126 Other intervertebral disc displacement, lumbar region: Secondary | ICD-10-CM | POA: Insufficient documentation

## 2018-05-04 LAB — POCT I-STAT CREATININE: Creatinine, Ser: 1.1 mg/dL — ABNORMAL HIGH (ref 0.44–1.00)

## 2018-05-04 MED ORDER — GADOBUTROL 1 MMOL/ML IV SOLN
7.5000 mL | Freq: Once | INTRAVENOUS | Status: AC | PRN
  Administered 2018-05-04: 7.5 mL via INTRAVENOUS

## 2018-06-06 ENCOUNTER — Other Ambulatory Visit: Payer: BLUE CROSS/BLUE SHIELD

## 2018-06-07 ENCOUNTER — Other Ambulatory Visit: Payer: Self-pay

## 2018-06-07 ENCOUNTER — Encounter
Admission: RE | Admit: 2018-06-07 | Discharge: 2018-06-07 | Disposition: A | Discharge status: Home / Self Care | Payer: Managed Care, Other (non HMO) | Source: Ambulatory Visit | Attending: Neurosurgery | Admitting: Neurosurgery

## 2018-06-07 ENCOUNTER — Ambulatory Visit
Admission: RE | Admit: 2018-06-07 | Discharge: 2018-06-07 | Disposition: A | Discharge status: Home / Self Care | Payer: Managed Care, Other (non HMO) | Source: Ambulatory Visit | Attending: Neurosurgery | Admitting: Neurosurgery

## 2018-06-07 DIAGNOSIS — Z01810 Encounter for preprocedural cardiovascular examination: Secondary | ICD-10-CM | POA: Diagnosis present

## 2018-06-07 HISTORY — DX: Unspecified osteoarthritis, unspecified site: M19.90

## 2018-06-07 HISTORY — DX: Headache: R51

## 2018-06-07 HISTORY — DX: Headache, unspecified: R51.9

## 2018-06-07 LAB — URINALYSIS, ROUTINE W REFLEX MICROSCOPIC
Bilirubin Urine: NEGATIVE
Glucose, UA: NEGATIVE mg/dL
Hgb urine dipstick: NEGATIVE
KETONES UR: NEGATIVE mg/dL
LEUKOCYTES UA: NEGATIVE
Nitrite: NEGATIVE
Protein, ur: NEGATIVE mg/dL
SPECIFIC GRAVITY, URINE: 1.012 (ref 1.005–1.030)
pH: 7 (ref 5.0–8.0)

## 2018-06-07 LAB — CBC
HCT: 35.9 % — ABNORMAL LOW (ref 36.0–46.0)
Hemoglobin: 12.1 g/dL (ref 12.0–15.0)
MCH: 28.2 pg (ref 26.0–34.0)
MCHC: 33.7 g/dL (ref 30.0–36.0)
MCV: 83.7 fL (ref 80.0–100.0)
PLATELETS: 287 10*3/uL (ref 150–400)
RBC: 4.29 MIL/uL (ref 3.87–5.11)
RDW: 13.2 % (ref 11.5–15.5)
WBC: 10.9 10*3/uL — ABNORMAL HIGH (ref 4.0–10.5)
nRBC: 0 % (ref 0.0–0.2)

## 2018-06-07 LAB — BASIC METABOLIC PANEL
ANION GAP: 6 (ref 5–15)
BUN: 14 mg/dL (ref 6–20)
CALCIUM: 9.6 mg/dL (ref 8.9–10.3)
CO2: 27 mmol/L (ref 22–32)
Chloride: 105 mmol/L (ref 98–111)
Creatinine, Ser: 1.09 mg/dL — ABNORMAL HIGH (ref 0.44–1.00)
GFR calc Af Amer: >60 mL/min (ref >60)
Glucose, Bld: 114 mg/dL — ABNORMAL HIGH (ref 70–99)
POTASSIUM: 3.9 mmol/L (ref 3.5–5.1)
SODIUM: 138 mmol/L (ref 135–145)

## 2018-06-07 LAB — APTT: APTT: 26 s (ref 24–36)

## 2018-06-07 LAB — PROTIME-INR
INR: 0.97
Prothrombin Time: 12.8 seconds (ref 11.4–15.2)

## 2018-06-07 NOTE — Patient Instructions (Signed)
Your procedure is scheduled on: Monday, June 24, 2018  Report to [facility]     DO NOT STOP ON THE FIRST FLOOR TO REGISTER  To find out your arrival time please call [phone] between 1PM - 3PM on Friday, June 21, 2018  Remember: Instructions that are not followed completely may result in serious medical risk,  up to and including death, or upon the discretion of your surgeon and anesthesiologist your  surgery may need to be rescheduled.     _X__ 1. Do not eat food after midnight the night before your procedure.                 No gum chewing or hard candies.                    ABSOLUTELY NOTHING SOLID IN YOUR MOUTH AFTER MIDNIGHT                  You may drink clear liquids up to 2 hours before you are scheduled to arrive for your surgery-                  DO not drink clear liquids within 2 hours of the start of your surgery.                  Clear Liquids include:  water, apple juice without pulp, clear carbohydrate                 drink such as Clearfast of Gatorade, Black Coffee or Tea (Do not add                 anything to coffee or tea).  __X__2.  On the morning of surgery brush your teeth with toothpaste and water,                    You may rinse your mouth with mouthwash if you wish.                        Do not swallow any toothpaste of mouthwash.     _X__ 3.  No Alcohol for 24 hours before or after surgery.   _X__ 4.  Do Not Smoke or use e-cigarettes For 24 Hours Prior to Your Surgery.                 Do not use any chewable tobacco products for at least 6 hours prior to                 surgery.  ____  5.  Bring all medications with you on the day of surgery if instructed.   ____  6.  Notify your doctor if there is any change in your medical condition      (cold, fever, infections).     Do not wear jewelry, make-up, hairpins, clips or nail polish. Do not wear lotions, powders, or perfumes. You may wear  deodorant. Do not shave 48 hours prior to surgery. Men may shave face and neck. Do not bring valuables to the hospital.    [facility] is not responsible for any belongings or valuables.  Contacts, dentures or bridgework may not be worn into surgery. Leave your suitcase in the car. After surgery it may be brought to your room. For patients admitted to the hospital, discharge time is determined by your treatment team.   Patients  discharged the day of surgery will not be allowed to drive home.   Please read over the following fact sheets that you were given:   PREPARING FOR SURGERY   ____ Take these medicines the morning of surgery with A SIP OF WATER:    1. NONE  2.   3.   4.  5.  6.  ____ Fleet Enema (as directed)   ___X_ Use CHG Soap as directed  ____ Use inhalers on the day of surgery  __X_ Stop metformin 2 days prior to surgery                LAST DOSE WILL BE DINNER ON February 7th    ____ Take 1/2 of usual insulin dose the night before surgery. No insulin the morning          of surgery.   _X___ Stop ALL ASPIRIN PRODUCTS ONE WEEK BEFORE SURGERY, ON 06/17/2018  __X__ Stop Anti-inflammatories ON June 17, 2018                   TYLENOL IS ACCEPTABLE AT ANY TIME   ____ Stop supplements until after surgery.    ____ Bring C-Pap to the hospital.   KEEP TAKING LISINOPRIL IN THE EVENINGS. DO NOT STOP THIS.  YOU MAY TAKE TUMS BUT NOT AFTER MIDNIGHT THE NIGHT BEFORE SURGERY

## 2018-06-24 ENCOUNTER — Ambulatory Visit: Payer: 59 | Admitting: Certified Registered"

## 2018-06-24 ENCOUNTER — Encounter: Payer: Self-pay | Admitting: *Deleted

## 2018-06-24 ENCOUNTER — Ambulatory Visit
Admission: RE | Admit: 2018-06-24 | Discharge: 2018-06-24 | Disposition: A | Discharge status: Home / Self Care | Payer: 59 | Attending: Neurosurgery | Admitting: Neurosurgery

## 2018-06-24 ENCOUNTER — Ambulatory Visit: Payer: 59

## 2018-06-24 ENCOUNTER — Other Ambulatory Visit: Payer: Self-pay

## 2018-06-24 ENCOUNTER — Encounter
Admission: RE | Disposition: A | Discharge status: Home / Self Care | Payer: Self-pay | Source: Home / Self Care | Attending: Neurosurgery

## 2018-06-24 DIAGNOSIS — M79605 Pain in left leg: Secondary | ICD-10-CM | POA: Diagnosis present

## 2018-06-24 DIAGNOSIS — Z7984 Long term (current) use of oral hypoglycemic drugs: Secondary | ICD-10-CM | POA: Insufficient documentation

## 2018-06-24 DIAGNOSIS — E119 Type 2 diabetes mellitus without complications: Secondary | ICD-10-CM | POA: Diagnosis not present

## 2018-06-24 DIAGNOSIS — I10 Essential (primary) hypertension: Secondary | ICD-10-CM | POA: Insufficient documentation

## 2018-06-24 DIAGNOSIS — M5416 Radiculopathy, lumbar region: Secondary | ICD-10-CM | POA: Insufficient documentation

## 2018-06-24 DIAGNOSIS — Z419 Encounter for procedure for purposes other than remedying health state, unspecified: Secondary | ICD-10-CM

## 2018-06-24 DIAGNOSIS — M48061 Spinal stenosis, lumbar region without neurogenic claudication: Secondary | ICD-10-CM | POA: Diagnosis not present

## 2018-06-24 DIAGNOSIS — M7138 Other bursal cyst, other site: Secondary | ICD-10-CM | POA: Insufficient documentation

## 2018-06-24 HISTORY — PX: LUMBAR LAMINECTOMY/DECOMPRESSION MICRODISCECTOMY: SHX5026

## 2018-06-24 LAB — GLUCOSE, CAPILLARY
GLUCOSE-CAPILLARY: 176 mg/dL — AB (ref 70–99)
Glucose-Capillary: 138 mg/dL — ABNORMAL HIGH (ref 70–99)

## 2018-06-24 LAB — SURGICAL PCR SCREEN
MRSA, PCR: NEGATIVE
Staphylococcus aureus: NEGATIVE

## 2018-06-24 LAB — POCT PREGNANCY, URINE: Preg Test, Ur: NEGATIVE

## 2018-06-24 SURGERY — LUMBAR LAMINECTOMY/DECOMPRESSION MICRODISCECTOMY 1 LEVEL
Anesthesia: General | Site: Spine Lumbar | Laterality: Left

## 2018-06-24 MED ORDER — ONDANSETRON HCL 4 MG/2ML IJ SOLN
INTRAMUSCULAR | Status: DC | PRN
  Administered 2018-06-24: 4 mg via INTRAVENOUS

## 2018-06-24 MED ORDER — BUPIVACAINE-EPINEPHRINE 0.5% -1:200000 IJ SOLN
INTRAMUSCULAR | Status: DC | PRN
  Administered 2018-06-24: 10 mL

## 2018-06-24 MED ORDER — SODIUM CHLORIDE 0.9 % IV SOLN
INTRAVENOUS | Status: DC
  Administered 2018-06-24: 09:00:00 via INTRAVENOUS

## 2018-06-24 MED ORDER — OXYCODONE HCL 5 MG PO TABS: 5.0000 mg | ORAL_TABLET | Freq: Once | ORAL | Status: DC | PRN

## 2018-06-24 MED ORDER — BACITRACIN 50000 UNITS IM SOLR
INTRAMUSCULAR | Status: AC
  Filled 2018-06-24: qty 1

## 2018-06-24 MED ORDER — SODIUM CHLORIDE 0.9 % IR SOLN
Status: DC | PRN
  Administered 2018-06-24: 1000 mL

## 2018-06-24 MED ORDER — METHYLPREDNISOLONE ACETATE 40 MG/ML IJ SUSP
INTRAMUSCULAR | Status: DC | PRN
  Administered 2018-06-24: 40 mg

## 2018-06-24 MED ORDER — PROPOFOL 10 MG/ML IV BOLUS
INTRAVENOUS | Status: DC | PRN
  Administered 2018-06-24: 150 mg via INTRAVENOUS

## 2018-06-24 MED ORDER — THROMBIN 5000 UNITS EX SOLR
CUTANEOUS | Status: AC
  Filled 2018-06-24: qty 5000

## 2018-06-24 MED ORDER — METHYLPREDNISOLONE ACETATE 40 MG/ML IJ SUSP
INTRAMUSCULAR | Status: AC
  Filled 2018-06-24: qty 1

## 2018-06-24 MED ORDER — LACTATED RINGERS IV SOLN
INTRAVENOUS | Status: DC | PRN
  Administered 2018-06-24: 10:00:00 via INTRAVENOUS

## 2018-06-24 MED ORDER — ROCURONIUM BROMIDE 100 MG/10ML IV SOLN
INTRAVENOUS | Status: DC | PRN
  Administered 2018-06-24: 50 mg via INTRAVENOUS

## 2018-06-24 MED ORDER — METHOCARBAMOL 500 MG PO TABS: 500.0000 mg | ORAL_TABLET | Freq: Four times a day (QID) | ORAL | 0 refills | Status: DC | PRN

## 2018-06-24 MED ORDER — FENTANYL CITRATE (PF) 250 MCG/5ML IJ SOLN
INTRAMUSCULAR | Status: AC
  Filled 2018-06-24: qty 5

## 2018-06-24 MED ORDER — DEXAMETHASONE SODIUM PHOSPHATE 10 MG/ML IJ SOLN
INTRAMUSCULAR | Status: DC | PRN
  Administered 2018-06-24: 8 mg via INTRAVENOUS

## 2018-06-24 MED ORDER — LIDOCAINE HCL (CARDIAC) PF 100 MG/5ML IV SOSY
PREFILLED_SYRINGE | INTRAVENOUS | Status: DC | PRN
  Administered 2018-06-24: 90 mg via INTRAVENOUS

## 2018-06-24 MED ORDER — SODIUM CHLORIDE FLUSH 0.9 % IV SOLN
INTRAVENOUS | Status: AC
  Filled 2018-06-24: qty 10

## 2018-06-24 MED ORDER — BUPIVACAINE HCL (PF) 0.5 % IJ SOLN
INTRAMUSCULAR | Status: AC
  Filled 2018-06-24: qty 30

## 2018-06-24 MED ORDER — THROMBIN 5000 UNITS EX SOLR
CUTANEOUS | Status: DC | PRN
  Administered 2018-06-24: 5000 [IU] via TOPICAL

## 2018-06-24 MED ORDER — MIDAZOLAM HCL 2 MG/2ML IJ SOLN
INTRAMUSCULAR | Status: DC | PRN
  Administered 2018-06-24: 2 mg via INTRAVENOUS

## 2018-06-24 MED ORDER — FENTANYL CITRATE (PF) 100 MCG/2ML IJ SOLN
INTRAMUSCULAR | Status: DC | PRN
  Administered 2018-06-24: 50 ug via INTRAVENOUS
  Administered 2018-06-24: 75 ug via INTRAVENOUS
  Administered 2018-06-24: 25 ug via INTRAVENOUS
  Administered 2018-06-24: 50 ug via INTRAVENOUS

## 2018-06-24 MED ORDER — FENTANYL CITRATE (PF) 100 MCG/2ML IJ SOLN: 25.0000 ug | INTRAMUSCULAR | Status: DC | PRN

## 2018-06-24 MED ORDER — LIDOCAINE-EPINEPHRINE 1 %-1:100000 IJ SOLN
INTRAMUSCULAR | Status: AC
  Filled 2018-06-24: qty 1

## 2018-06-24 MED ORDER — CEFAZOLIN SODIUM-DEXTROSE 2-4 GM/100ML-% IV SOLN
2.0000 g | Freq: Once | INTRAVENOUS | Status: AC
  Administered 2018-06-24: 2 g via INTRAVENOUS

## 2018-06-24 MED ORDER — MEPERIDINE HCL 50 MG/ML IJ SOLN: 6.2500 mg | INTRAMUSCULAR | Status: DC | PRN

## 2018-06-24 MED ORDER — OXYCODONE HCL 5 MG/5ML PO SOLN: 5.0000 mg | Freq: Once | ORAL | Status: DC | PRN

## 2018-06-24 MED ORDER — FAMOTIDINE 20 MG PO TABS
20.0000 mg | ORAL_TABLET | Freq: Once | ORAL | Status: AC
  Administered 2018-06-24: 20 mg via ORAL

## 2018-06-24 MED ORDER — EPINEPHRINE PF 1 MG/ML IJ SOLN
INTRAMUSCULAR | Status: AC
  Filled 2018-06-24: qty 2

## 2018-06-24 MED ORDER — FAMOTIDINE 20 MG PO TABS
ORAL_TABLET | ORAL | Status: AC
  Filled 2018-06-24: qty 1

## 2018-06-24 MED ORDER — PROMETHAZINE HCL 25 MG/ML IJ SOLN
6.2500 mg | INTRAMUSCULAR | Status: DC | PRN
  Administered 2018-06-24: 6.25 mg via INTRAVENOUS

## 2018-06-24 MED ORDER — PROPOFOL 10 MG/ML IV BOLUS
INTRAVENOUS | Status: AC
  Filled 2018-06-24: qty 20

## 2018-06-24 MED ORDER — PROMETHAZINE HCL 25 MG/ML IJ SOLN
INTRAMUSCULAR | Status: AC
  Filled 2018-06-24: qty 1

## 2018-06-24 MED ORDER — OXYCODONE HCL 5 MG PO TABS: 5.0000 mg | ORAL_TABLET | ORAL | 0 refills | Status: DC | PRN

## 2018-06-24 MED ORDER — CEFAZOLIN SODIUM-DEXTROSE 2-4 GM/100ML-% IV SOLN
INTRAVENOUS | Status: AC
  Filled 2018-06-24: qty 100

## 2018-06-24 MED ORDER — MIDAZOLAM HCL 2 MG/2ML IJ SOLN
INTRAMUSCULAR | Status: AC
  Filled 2018-06-24: qty 2

## 2018-06-24 SURGICAL SUPPLY — 55 items
BUR NEURO DRILL SOFT 3.0X3.8M (BURR) ×3 IMPLANT
CANISTER SUCT 1200ML W/VALVE (MISCELLANEOUS) ×6 IMPLANT
CHLORAPREP W/TINT 26ML (MISCELLANEOUS) ×3 IMPLANT
COUNTER NEEDLE 20/40 LG (NEEDLE) ×3 IMPLANT
COVER LIGHT HANDLE STERIS (MISCELLANEOUS) ×6 IMPLANT
COVER WAND RF STERILE (DRAPES) IMPLANT
CUP MEDICINE 2OZ PLAST GRAD ST (MISCELLANEOUS) ×3 IMPLANT
DERMABOND ADVANCED (GAUZE/BANDAGES/DRESSINGS) ×2
DERMABOND ADVANCED .7 DNX12 (GAUZE/BANDAGES/DRESSINGS) ×1 IMPLANT
DRAPE C-ARM 42X72 X-RAY (DRAPES) ×6 IMPLANT
DRAPE LAPAROTOMY 100X77 ABD (DRAPES) ×3 IMPLANT
DRAPE MICROSCOPE SPINE 48X150 (DRAPES) ×3 IMPLANT
DRAPE SURG 17X11 SM STRL (DRAPES) ×3 IMPLANT
DURASEAL APPLICATOR TIP (TIP) IMPLANT
DURASEAL SPINE SEALANT 3ML (MISCELLANEOUS) IMPLANT
ELECT CAUTERY BLADE TIP 2.5 (TIP) ×3
ELECT EZSTD 165MM 6.5IN (MISCELLANEOUS)
ELECT REM PT RETURN 9FT ADLT (ELECTROSURGICAL) ×3
ELECTRODE CAUTERY BLDE TIP 2.5 (TIP) ×1 IMPLANT
ELECTRODE EZSTD 165MM 6.5IN (MISCELLANEOUS) IMPLANT
ELECTRODE REM PT RTRN 9FT ADLT (ELECTROSURGICAL) ×1 IMPLANT
GAUZE SPONGE 4X4 12PLY STRL (GAUZE/BANDAGES/DRESSINGS) ×3 IMPLANT
GLOVE BIOGEL PI IND STRL 7.0 (GLOVE) ×2 IMPLANT
GLOVE BIOGEL PI IND STRL 8 (GLOVE) ×1 IMPLANT
GLOVE BIOGEL PI INDICATOR 7.0 (GLOVE) ×4
GLOVE BIOGEL PI INDICATOR 8 (GLOVE) ×2
GLOVE SURG SYN 7.0 (GLOVE) ×9 IMPLANT
GLOVE SURG SYN 8.0 (GLOVE) ×9 IMPLANT
GOWN STRL REUS W/ TWL XL LVL3 (GOWN DISPOSABLE) ×1 IMPLANT
GOWN STRL REUS W/TWL MED LVL3 (GOWN DISPOSABLE) ×3 IMPLANT
GOWN STRL REUS W/TWL XL LVL3 (GOWN DISPOSABLE) ×2
GRADUATE 1200CC STRL 31836 (MISCELLANEOUS) ×3 IMPLANT
KIT TURNOVER KIT A (KITS) ×3 IMPLANT
KIT WILSON FRAME (KITS) ×3 IMPLANT
KNIFE BAYONET SHORT DISCETOMY (MISCELLANEOUS) IMPLANT
MARKER SKIN DUAL TIP RULER LAB (MISCELLANEOUS) ×6 IMPLANT
NDL SAFETY ECLIPSE 18X1.5 (NEEDLE) ×2 IMPLANT
NEEDLE HYPO 18GX1.5 SHARP (NEEDLE) ×4
NEEDLE HYPO 22GX1.5 SAFETY (NEEDLE) ×3 IMPLANT
NS IRRIG 1000ML POUR BTL (IV SOLUTION) ×3 IMPLANT
PACK LAMINECTOMY NEURO (CUSTOM PROCEDURE TRAY) ×3 IMPLANT
PAD ARMBOARD 7.5X6 YLW CONV (MISCELLANEOUS) ×3 IMPLANT
SPOGE SURGIFLO 8M (HEMOSTASIS) ×2
SPONGE SURGIFLO 8M (HEMOSTASIS) ×1 IMPLANT
STAPLER SKIN PROX 35W (STAPLE) IMPLANT
SUT NURALON 4 0 TR CR/8 (SUTURE) IMPLANT
SUT POLYSORB 2-0 5X18 GS-10 (SUTURE) ×6 IMPLANT
SUT VIC AB 0 CT1 18XCR BRD 8 (SUTURE) ×1 IMPLANT
SUT VIC AB 0 CT1 8-18 (SUTURE) ×2
SYR 10ML LL (SYRINGE) ×6 IMPLANT
SYR 30ML LL (SYRINGE) ×3 IMPLANT
SYR 3ML LL SCALE MARK (SYRINGE) ×3 IMPLANT
TOWEL OR 17X26 4PK STRL BLUE (TOWEL DISPOSABLE) ×12 IMPLANT
TUBING CONNECTING 10 (TUBING) ×2 IMPLANT
TUBING CONNECTING 10' (TUBING) ×1

## 2018-06-24 NOTE — Anesthesia Postprocedure Evaluation (Signed)
Anesthesia Post Note  Patient: Tax inspector  Procedure(s) Performed: LUMBAR HEMILAMINECTOMY, FORAMINOTOMY, CYST REMOVAL - 1 LEVEL LEFT L4-5 (Left Spine Lumbar)  Patient location during evaluation: PACU Anesthesia Type: General Level of consciousness: awake and alert Pain management: pain level controlled Vital Signs Assessment: post-procedure vital signs reviewed and stable Respiratory status: spontaneous breathing, nonlabored ventilation, respiratory function stable and patient connected to nasal cannula oxygen Cardiovascular status: blood pressure returned to baseline and stable Postop Assessment: no apparent nausea or vomiting Anesthetic complications: no     Last Vitals:  Vitals:   06/24/18 1424 06/24/18 1436  BP: 124/75 125/81  Pulse: 95 91  Resp: 20 (!) 25  Temp:    SpO2: 97% 97%    Last Pain:  Vitals:   06/24/18 1436  TempSrc:   PainSc: 0-No pain                 Precious Haws Lakhia Gengler

## 2018-06-24 NOTE — Discharge Summary (Signed)
Procedure: L4-5 Lumbar decompression Procedure date: 06/24/2018 Diagnosis: Lumbar radiculopathy  History: Geneveive Furness is POD0 s/p L4-5 lumbar decompression for lumbar radiculopathy. Tolerated procedure well without complication.  Evaluated in postop recovery still disoriented from anesthesia but mostly able to answer questions and obey commands. Symptoms prior to surgery seem to be resolved at this time. Denies any new lower extremity pain/numbness/tingling.   Physical Exam: Vitals:   06/24/18 0848  BP: (!) 142/90  Pulse: 74  Resp: 16  Temp: 97.9 F (36.6 C)  SpO2: 100%    Strength:5/5 throughout upper and lower extremities Sensation: Intact and symmetric throughout upper and lower extremities Skin: Glue intact at incision site.  Data:  No results for input(s): NA, K, CL, CO2, BUN, CREATININE, LABGLOM, GLUCOSE, CALCIUM in the last 168 hours. No results for input(s): AST, ALT, ALKPHOS in the last 168 hours.  Invalid input(s): TBILI   No results for input(s): WBC, HGB, HCT, PLT in the last 168 hours. No results for input(s): APTT, INR in the last 168 hours.       Other tests/results: No imaging reviewed.   Assessment/Plan:  Chelby Salata is POD0 s/p L4-5 lumbar decompression for lumbar radiculopathy.  Symptoms that were present prior to surgery have resolved.  Neuro intact. Will continue pain control with tylenol, ibuprofen, robaxin, and oxycodone as needed. She is scheduled to follow up in clinic in approximately 2 weeks to monitor progress.    Marin Olp PA-C Department of Neurosurgery

## 2018-06-24 NOTE — Anesthesia Procedure Notes (Signed)
Procedure Name: Intubation Date/Time: 06/24/2018 10:13 AM Performed by: Rona Ravens, CRNA Pre-anesthesia Checklist: Patient identified, Emergency Drugs available, Suction available and Patient being monitored Patient Re-evaluated:Patient Re-evaluated prior to induction Oxygen Delivery Method: Circle system utilized Preoxygenation: Pre-oxygenation with 100% oxygen Induction Type: IV induction Ventilation: Mask ventilation without difficulty Grade View: Grade I Tube type: Oral Tube size: 7.0 mm Number of attempts: 1 Airway Equipment and Method: Stylet Placement Confirmation: ETT inserted through vocal cords under direct vision,  positive ETCO2 and breath sounds checked- equal and bilateral Secured at: 22 cm Tube secured with: Tape Dental Injury: Teeth and Oropharynx as per pre-operative assessment

## 2018-06-24 NOTE — Discharge Instructions (Signed)

## 2018-06-24 NOTE — H&P (Signed)
Sherry Hobbs is an 48 y.o. female.   Chief Complaint: Left leg pain HPI:Ms. Petraitis describes today ongoing pain in the left back and hip area that has been going on for approximately 9 months. She has had injections in her hips before without any relief. She has been treated with medications without any significant relief. She does feel like this is impeding her quality of life. She describes some numbness in her right toes, however no ongoing numbness in the left leg. She denies any weakness. 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 I do think this is likely related to her symptoms. Given the failure of conservative management today, I did discuss with her the possibility of surgery for decompression which would consist of a laminectomy and cyst removal. We described the risk including bleeding, infection, damage to the nerve root, CSF leak, and risk of additional surgery needed in the future. We do need x-rays today to make sure there is no abnormal motion that would necessitate a fusion. Given her ongoing symptoms, she would like to proceed with surgery     Past Medical History:  Diagnosis Date  . Anemia    problems when having abnmornal uterine bleeding.  . Arthritis    lower back  . Depression   . Diabetes mellitus without complication ([place])   . Elevated serum creatinine   . GERD (gastroesophageal reflux disease)    OCC  NO MEDS  . Headache    occasional migraines  . History of blood transfusion 05/02/2016   due to abnormal uterine bleeding  . Hypertension     Past Surgical History:  Procedure Laterality Date  . DILATION AND CURETTAGE OF UTERUS  05/03/2016   Procedure: DILATATION AND CURETTAGE;  Surgeon: [name], MD;  Location: [facility] ORS;  Service: Gynecology;;  . Murrell Redden & CURRETTAGE/HYSTROSCOPY WITH NOVASURE ABLATION N/A  12/03/2017   Procedure: DILATATION & CURETTAGE/HYSTEROSCOPY WITH MINERVA ABLATION;  Surgeon: [name], MD;  Location: [facility] ORS;  Service: Gynecology;  Laterality: N/A;  MINERVA REP CONTACTED ([place] [phone])    Family History  Problem Relation Age of Onset  . Diabetes Mother   . COPD Mother   . Emphysema Mother   . Osteoporosis Mother   . CAD Father   . Diabetes Sister   . Diabetes Brother   . CAD Brother   . Renal cancer Maternal Uncle        1 uncle  . Brain cancer Maternal Uncle        different uncle  . Diabetes Maternal Grandmother   . Stroke Maternal Grandmother   . Bladder Cancer Maternal Grandfather    Social History:  reports that she has never smoked. She has never used smokeless tobacco. She reports that she does not drink alcohol or use drugs.  Allergies:  Allergies  Allergen Reactions  . Codeine Nausea And Vomiting    Medications Prior to Admission  Medication Sig Dispense Refill  . calcium carbonate (TUMS - DOSED IN MG ELEMENTAL CALCIUM) 500 MG chewable tablet Chew 1 tablet by mouth as needed for indigestion or heartburn.    Marland Kitchen ibuprofen (ADVIL,MOTRIN) 600 MG tablet Take 1 tablet (600 mg total) by mouth every 6 (six) hours as needed. 30 tablet 1  . lisinopril (PRINIVIL,ZESTRIL) 10 MG tablet Take 10 mg by mouth every morning.     Marland Kitchen  metFORMIN (GLUCOPHAGE) 500 MG tablet Take 500-1,000 mg by mouth See admin instructions. Take 500 mg by mouth in the morning and 1000 mg in the evening.  1  . acetaminophen (TYLENOL 8 HOUR) 650 MG CR tablet Take 1 tablet (650 mg total) by mouth every 8 (eight) hours as needed for pain. (Patient not taking: Reported on 05/29/2018) 30 tablet 1  . docusate sodium (COLACE) 100 MG capsule Take 1 capsule (100 mg total) by mouth 2 (two) times daily. (Patient not taking: Reported on 05/29/2018) 60 capsule 6  . medroxyPROGESTERone (PROVERA) 10 MG tablet Take 1 tablet (10 mg total) by mouth daily. (Patient not taking: Reported on  05/29/2018) 10 tablet 0    Results for orders placed or performed during the hospital encounter of 06/24/18 (from the past 48 hour(s))  Pregnancy, urine POC     Status: None   Collection Time: 06/24/18  8:51 AM  Result Value Ref Range   Preg Test, Ur NEGATIVE NEGATIVE    Comment:        THE SENSITIVITY OF THIS METHODOLOGY IS >24 mIU/mL   Glucose, capillary     Status: Abnormal   Collection Time: 06/24/18  8:53 AM  Result Value Ref Range   Glucose-Capillary 138 (H) 70 - 99 mg/dL   No results found.  ROS General ROS: Negative Psychological ROS: Negative Ophthalmic ROS: Negative ENT ROS: Negative Hematological and Lymphatic ROS: Negative  Endocrine ROS: Negative Respiratory ROS: Negative Cardiovascular ROS: Negative Gastrointestinal ROS: Negative Genito-Urinary ROS: Negative Musculoskeletal ROS: Positive for back pain Neurological ROS: Positive for left leg pain Dermatological ROS: Negative  Blood pressure (!) 142/90, pulse 74, temperature 97.9 F (36.6 C), temperature source Oral, resp. rate 16, height 5\' 2"  (1.575 m), weight 86.2 kg, SpO2 100 %. Physical Exam  General appearance: Alert, cooperative, in no acute distress Head: Normocephalic, atraumatic Eyes: Normal, EOM intact Oropharynx: Moist without lesions Back: No tenderness to palpation over the midline or paramedian regions Ext: No edema in LE bilaterally, warm extremities Pulm: Clear to auscultation CV: Regular rate   Neurologic exam:  Mental status: alertness: alert, affect: normal Speech: fluent and clear Motor:strength symmetric 5/5, normal muscle mass and tone in bilateral lower extremities Sensory: intact to light touch in bilateral lower extremities Reflexes: 2+ and symmetric bilaterally for patella Gait: normal   Imaging: MRI Lumbar Spine: There is a normal lordotic curvature with overall normal alignment. The space heights appear well-preserved with the mild degeneration noted in the lower lumbar  region. There is a synovial cyst noted on the left at L4-5 results in moderate stenosis in the lateral recess and centrally. There are no other areas of significant stenosis noted   Assessment/Plan -Left L4-5 hemilaminectomy vs laminectomy, foraminotomy and cyst removal   [name], MD 06/24/2018, 9:30 AM

## 2018-06-24 NOTE — Progress Notes (Signed)
Cefazolin Pharmacy consult  48 yo F  Wt= 86.2 kg  Will order Cefazolin 2 gram IV once for surgical prophylaxis   Chinita Greenland PharmD Clinical Pharmacist 06/24/2018

## 2018-06-24 NOTE — Interval H&P Note (Signed)
History and Physical Interval Note:  06/24/2018 9:33 AM  Sherry Hobbs  has presented today for surgery, with the diagnosis of LUMBAR RADICULOPATHY  The various methods of treatment have been discussed with the patient and family. After consideration of risks, benefits and other options for treatment, the patient has consented to  Procedure(s): LUMBAR HEMILAMINECTOMY, FORAMINOTOMY, CYST REMOVAL - 1 LEVEL LEFT L4-5 (Left) as a surgical intervention .  The patient's history has been reviewed, patient examined, no change in status, stable for surgery.  I have reviewed the patient's chart and labs.  Questions were answered to the patient's satisfaction.     [name]

## 2018-06-24 NOTE — Anesthesia Post-op Follow-up Note (Signed)
Anesthesia QCDR form completed.        

## 2018-06-24 NOTE — Anesthesia Preprocedure Evaluation (Signed)
Anesthesia Evaluation  Patient identified by MRN, date of birth, ID band Patient awake    Reviewed: Allergy & Precautions, NPO status , Patient's Chart, lab work & pertinent test results  History of Anesthesia Complications Negative for: history of anesthetic complications  Airway Mallampati: II  TM Distance: >3 FB Neck ROM: Full    Dental  (+)    Pulmonary neg pulmonary ROS, neg sleep apnea, neg COPD,    breath sounds clear to auscultation- rhonchi (-) wheezing      Cardiovascular Exercise Tolerance: Good hypertension, Pt. on medications (-) CAD, (-) Past MI, (-) Cardiac Stents and (-) CABG  Rhythm:Regular Rate:Normal - Systolic murmurs and - Diastolic murmurs    Neuro/Psych  Headaches, neg Seizures PSYCHIATRIC DISORDERS Depression    GI/Hepatic Neg liver ROS, GERD  ,  Endo/Other  diabetes, Oral Hypoglycemic Agents  Renal/GU negative Renal ROS     Musculoskeletal  (+) Arthritis ,   Abdominal (+) + obese,   Peds  Hematology  (+) anemia ,   Anesthesia Other Findings Past Medical History: No date: Anemia     Comment:  problems when having abnmornal uterine bleeding. No date: Arthritis     Comment:  lower back No date: Depression No date: Diabetes mellitus without complication ([facility]) No date: Elevated serum creatinine No date: GERD (gastroesophageal reflux disease)     Comment:  OCC  NO MEDS No date: Headache     Comment:  occasional migraines 05/02/2016: History of blood transfusion     Comment:  due to abnormal uterine bleeding No date: Hypertension   Reproductive/Obstetrics                             Anesthesia Physical Anesthesia Plan  ASA: II  Anesthesia Plan: General   Post-op Pain Management:    Induction: Intravenous  PONV Risk Score and Plan: 2 and Ondansetron, Dexamethasone and Midazolam  Airway Management Planned: Oral ETT  Additional Equipment:   Intra-op  Plan:   Post-operative Plan: Extubation in OR  Informed Consent: I have reviewed the patients History and Physical, chart, labs and discussed the procedure including the risks, benefits and alternatives for the proposed anesthesia with the patient or authorized representative who has indicated his/her understanding and acceptance.     Dental advisory given  Plan Discussed with: CRNA and Anesthesiologist  Anesthesia Plan Comments:         Anesthesia Quick Evaluation

## 2018-06-24 NOTE — Anesthesia Postprocedure Evaluation (Signed)
Anesthesia Post Note  Patient: Tax inspector  Procedure(s) Performed: LUMBAR HEMILAMINECTOMY, FORAMINOTOMY, CYST REMOVAL - 1 LEVEL LEFT L4-5 (Left Spine Lumbar)  Anesthesia Type: General     Last Vitals:  Vitals:   06/24/18 0848 06/24/18 1309  BP: (!) 142/90 135/75  Pulse: 74 96  Resp: 16 16  Temp: 36.6 C (!) 36.3 C  SpO2: 100% 100%    Last Pain:  Vitals:   06/24/18 1309  TempSrc:   PainSc: (P) Asleep                 Rona Ravens

## 2018-06-24 NOTE — Op Note (Addendum)
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 Moroni to the clinic on 1/14with ongoing left leg pain and numbness.  She had failed conservative management including PT, OTC medications, and prescription medications. MRI revealed a left L4/5 synovial cyst displacing the L5 nerve root Dynamic x-rays showed no abnormal movement and hemilaminectomy and cyst removal was discussed to relieve symptoms. Therisks of surgery were explained to include hematoma, infection, damage to nerve roots, CSF leak, weakness, numbness, pain, need for future surgery including fusion, heart attack, and stroke. She elected to proceed with surgery for symptom relief.   Procedure The patient was brought to the OR after informed consent was obtained. She was given general anesthesia and intubated by the anesthesia service. Vascular access lines were placed.The patient was then placed prone on a Wilson frameensuring all pressure points were padded. A time-out was performed per protocol.   The patient was sterilely prepped and draped. Fluoroscopy confirmed L4/5 interspace and a midline incision was planned.The incision was instilled withlocal anesthetic with epinephrine. The skin was opened sharply and the dissection taken to the fascia. This was incised and cautery was used to dissect the subperiosteal plane to expose the spinous processes and lamina of L5 and S1 on the left out to the medial edge of the facet.  Retractors  were inserted and hemostasis was achieved. X-ray was used to confirm location.  Next, a matchstickdrill bit was used to remove the L4lamina centrally. The underlying ligament was freed and removed with combination of rongeurs. The decompression was taken caudal to the superior border of L5 and then the medial facet was removed. A medial facetectomy was performed with foraminotomy at the L4/5level. The cyst was debulked and the wall was seen to be adherent to the dura medially. The attachment to the facet was removed and the L5 nerve easily dissected free. Once the caudal and rostral border was easily defined, we removed as much of the cyst wall as safely possible.  There was a small amount left adherent to the dura but the thecal sac was fully expanded and no compression was seen on the nerve roots.   Hemostasis was obtained with Floseal and cautery. Depomedrol was placed along nerve root.The fascia was then closed using 0 vicryl followed by the subcutaneous and dermal layers with 2-0 vicryl until the epidermis was well approximated. The skin was closed with Dermabond. A dressing was applied.  The patient was returned to supine position and extubated by the anesthesia service. The patient was then taken to the PACU for post-operative care where she was moving extremities symmetrically.   ESTIMATED BLOOD LOSS: 10 cc  SPECIMENS None  IMPLANT None   I performed the case in its entirety with assistance of PA, Corrie Mckusick, [place]

## 2018-06-24 NOTE — Transfer of Care (Signed)
Immediate Anesthesia Transfer of Care Note  Patient: Sherry Hobbs  Procedure(s) Performed: LUMBAR HEMILAMINECTOMY, FORAMINOTOMY, CYST REMOVAL - 1 LEVEL LEFT L4-5 (Left Spine Lumbar)  Patient Location: PACU  Anesthesia Type:General  Level of Consciousness: sedated and responds to stimulation  Airway & Oxygen Therapy: Patient Spontanous Breathing and Patient connected to face mask oxygen  Post-op Assessment: Report given to RN and Post -op Vital signs reviewed and stable  Post vital signs: Reviewed and stable  Last Vitals:  Vitals Value Taken Time  BP 135/75 06/24/2018  1:09 PM  Temp 36.3 C 06/24/2018  1:09 PM  Pulse 93 06/24/2018  1:14 PM  Resp 31 06/24/2018  1:14 PM  SpO2 100 % 06/24/2018  1:14 PM  Vitals shown include unvalidated device data.  Last Pain:  Vitals:   06/24/18 1309  TempSrc:   PainSc: (P) Asleep         Complications: No apparent anesthesia complications

## 2018-06-26 LAB — SURGICAL PATHOLOGY

## 2018-07-30 ENCOUNTER — Other Ambulatory Visit: Payer: Self-pay | Admitting: Student

## 2018-07-30 ENCOUNTER — Other Ambulatory Visit (HOSPITAL_COMMUNITY): Payer: Self-pay | Admitting: Student

## 2018-07-30 DIAGNOSIS — M545 Low back pain, unspecified: Secondary | ICD-10-CM

## 2018-08-08 ENCOUNTER — Other Ambulatory Visit: Payer: Self-pay

## 2018-08-08 ENCOUNTER — Ambulatory Visit
Admission: RE | Admit: 2018-08-08 | Discharge: 2018-08-08 | Disposition: A | Discharge status: Home / Self Care | Payer: Managed Care, Other (non HMO) | Source: Ambulatory Visit | Attending: Student | Admitting: Student

## 2018-08-08 DIAGNOSIS — M545 Low back pain, unspecified: Secondary | ICD-10-CM

## 2018-10-14 ENCOUNTER — Other Ambulatory Visit: Payer: Self-pay | Admitting: Obstetrics and Gynecology

## 2018-11-20 DIAGNOSIS — M79604 Pain in right leg: Secondary | ICD-10-CM | POA: Insufficient documentation

## 2018-11-20 DIAGNOSIS — M25552 Pain in left hip: Secondary | ICD-10-CM | POA: Insufficient documentation

## 2018-11-20 DIAGNOSIS — M545 Low back pain, unspecified: Secondary | ICD-10-CM | POA: Insufficient documentation

## 2019-02-25 ENCOUNTER — Ambulatory Visit
Payer: Managed Care, Other (non HMO) | Attending: Student in an Organized Health Care Education/Training Program | Admitting: Student in an Organized Health Care Education/Training Program

## 2019-02-25 ENCOUNTER — Encounter: Payer: Self-pay | Admitting: Student in an Organized Health Care Education/Training Program

## 2019-02-25 ENCOUNTER — Other Ambulatory Visit: Payer: Self-pay

## 2019-02-25 VITALS — BP 140/96 | HR 77 | Temp 98.4°F | Resp 18 | Ht 62.0 in | Wt 193.0 lb

## 2019-02-25 DIAGNOSIS — G894 Chronic pain syndrome: Secondary | ICD-10-CM

## 2019-02-25 DIAGNOSIS — Z9889 Other specified postprocedural states: Secondary | ICD-10-CM | POA: Diagnosis present

## 2019-02-25 DIAGNOSIS — M5416 Radiculopathy, lumbar region: Secondary | ICD-10-CM

## 2019-02-25 DIAGNOSIS — M47816 Spondylosis without myelopathy or radiculopathy, lumbar region: Secondary | ICD-10-CM

## 2019-02-25 DIAGNOSIS — G8929 Other chronic pain: Secondary | ICD-10-CM | POA: Diagnosis present

## 2019-02-25 MED ORDER — PREGABALIN 75 MG PO CAPS: 75.0000 mg | ORAL_CAPSULE | Freq: Two times a day (BID) | ORAL | 1 refills | Status: DC

## 2019-02-25 MED ORDER — METHOCARBAMOL 750 MG PO TABS: 750.0000 mg | ORAL_TABLET | Freq: Three times a day (TID) | ORAL | 1 refills | Status: DC | PRN

## 2019-02-25 NOTE — Patient Instructions (Signed)
Preparing for your procedure (without sedation) Instructions: . Oral Intake: Do not eat or drink anything for at least 3 hours prior to your procedure. . Transportation: Unless otherwise stated by your physician, you may drive yourself after the procedure. . Blood Pressure Medicine: Take your blood pressure medicine with a sip of water the morning of the procedure. . Insulin: Take only  of your normal insulin dose. . Preventing infections: Shower with an antibacterial soap the morning of your procedure. . Build-up your immune system: Take 1000 mg of Vitamin C with every meal (3 times a day) the day prior to your procedure. . Pregnancy: If you are pregnant, call and cancel the procedure. . Sickness: If you have a cold, fever, or any active infections, call and cancel the procedure. . Arrival: You must be in the facility at least 30 minutes prior to your scheduled procedure. . Children: Do not bring any children with you. . Dress appropriately: Bring dark clothing that you would not mind if they get stained. . Valuables: Do not bring any jewelry or valuables. Procedure appointments are reserved for interventional treatments only. . No Prescription Refills. . No medication changes will be discussed during procedure appointments. No disability issues will be discussed.Epidural Steroid Injection Patient Information  Description: The epidural space surrounds the nerves as they exit the spinal cord.  In some patients, the nerves can be compressed and inflamed by a bulging disc or a tight spinal canal (spinal stenosis).  By injecting steroids into the epidural space, we can bring irritated nerves into direct contact with a potentially helpful medication.  These steroids act directly on the irritated nerves and can reduce swelling and inflammation which often leads to decreased pain.  Epidural steroids may be injected anywhere along the spine and from the neck to the low back depending upon the location  of your pain.   After numbing the skin with local anesthetic (like Novocaine), a small needle is passed into the epidural space slowly.  You may experience a sensation of pressure while this is being done.  The entire block usually last less than 10 minutes.  Conditions which may be treated by epidural steroids:  Low back and leg pain Neck and arm pain Spinal stenosis Post-laminectomy syndrome Herpes zoster (shingles) pain Pain from compression fractures  Preparation for the injection:  Do not eat any solid food or dairy products within 8 hours of your appointment.  You may drink clear liquids up to 3 hours before appointment.  Clear liquids include water, black coffee, juice or soda.  No milk or cream please. You may take your regular medication, including pain medications, with a sip of water before your appointment  Diabetics should hold regular insulin (if taken separately) and take 1/2 normal NPH dos the morning of the procedure.  Carry some sugar containing items with you to your appointment. A driver must accompany you and be prepared to drive you home after your procedure.  Bring all your current medications with your. An IV may be inserted and sedation may be given at the discretion of the physician.   A blood pressure cuff, EKG and other monitors will often be applied during the procedure.  Some patients may need to have extra oxygen administered for a short period. You will be asked to provide medical information, including your allergies, prior to the procedure.  We must know immediately if you are taking blood thinners (like Coumadin/Warfarin)  Or if you are allergic to IV iodine contrast (  dye). We must know if you could possible be pregnant.  Possible side-effects: Bleeding from needle site Infection (rare, may require surgery) Nerve injury (rare) Numbness & tingling (temporary) Difficulty urinating (rare, temporary) Spinal headache ( a headache worse with upright  posture) Light -headedness (temporary) Pain at injection site (several days) Decreased blood pressure (temporary) Weakness in arm/leg (temporary) Pressure sensation in back/neck (temporary)  Call if you experience: Fever/chills associated with headache or increased back/neck pain. Headache worsened by an upright position. New onset weakness or numbness of an extremity below the injection site Hives or difficulty breathing (go to the emergency room) Inflammation or drainage at the infection site Severe back/neck pain Any new symptoms which are concerning to you  Please note:  Although the local anesthetic injected can often make your back or neck feel good for several hours after the injection, the pain will likely return.  It takes 3-7 days for steroids to work in the epidural space.  You may not notice any pain relief for at least that one week.  If effective, we will often do a series of three injections spaced 3-6 weeks apart to maximally decrease your pain.  After the initial series, we generally will wait several months before considering a repeat injection of the same type.  If you have any questions, please call [phone] . [facility] 

## 2019-02-25 NOTE — Progress Notes (Signed)
Safety precautions to be maintained throughout the outpatient stay will include: orient to surroundings, keep bed in low position, maintain call bell within reach at all times, provide assistance with transfer out of bed and ambulation.  

## 2019-02-25 NOTE — Progress Notes (Signed)
Patient's Name: Sherry Hobbs  MRN: [id number]  Referring Provider: Marin Olp, PA-C  DOB: February 15, 1971  PCP: Theotis Burrow, MD  DOS: 02/25/2019  Note by: Gillis Santa, MD  Service setting: Ambulatory outpatient  Specialty: Interventional Pain Management  Location: [facility] (AMB) Pain Management Facility  Visit type: Initial Patient Evaluation  Patient type: New Patient   Primary Reason(s) for Visit: Encounter for initial evaluation of one or more chronic problems (new to examiner) potentially causing chronic pain, and posing a threat to normal musculoskeletal function. (Level of risk: High) CC: Hip Pain (left)  HPI  Sherry Hobbs is a 48 y.o. year old, female patient, who comes today to see [place] for the first time for an initial evaluation of her chronic pain. She has Anemia; DUB (dysfunctional uterine bleeding); Ovarian cyst; History of blood transfusion; History of lumbar surgery (L4-L5 laminectomy for cyst removal Dr Lacinda Axon 06/2018); Chronic radicular lumbar pain (left); Lumbar radiculopathy (left); Lumbar facet arthropathy; and Chronic pain syndrome on their problem list. Today she comes in for evaluation of her Hip Pain (left)  Pain Assessment: Location: Left Hip Radiating: radiates doen left leg to calf on the outside Onset: More than a month ago Duration: Chronic pain Quality: Aching, Dull, Stabbing, Sharp Severity: 4 /10 (subjective, self-reported pain score)  Note: Reported level is compatible with observation.                         When using our objective Pain Scale, levels between 6 and 10/10 are said to belong in an emergency room, as it progressively worsens from a 6/10, described as severely limiting, requiring emergency care not usually available at an outpatient pain management facility. At a 6/10 level, communication becomes difficult and requires great effort. Assistance to reach the emergency department may be required. Facial flushing and profuse sweating along with  potentially dangerous increases in heart rate and blood pressure will be evident. Effect on ADL: Limits activities Timing: Constant Modifying factors: rest BP: (!) 140/96  HR: 77  Onset and Duration: Present longer than 3 months Cause of pain: Unknown Severity: Getting worse, NAS-11 at its worse: 10/10, NAS-11 at its best: 2/10, NAS-11 now: 6/10 and NAS-11 on the average: 6/10 Timing: Morning, Noon, Afternoon, Night, During activity or exercise, After activity or exercise and After a period of immobility Aggravating Factors: Bending, Climbing, Intercourse (sex), Lifiting, Motion, Prolonged sitting, Prolonged standing, Twisting, Walking, Walking uphill, Walking downhill and Working Alleviating Factors: Hot packs and Medications Associated Problems: Day-time cramps, Night-time cramps, Fatigue, Inability to control bladder (urine), Numbness, Spasms, Swelling, Tingling and Pain that does not allow patient to sleep Quality of Pain: Annoying, Cramping, Deep, Dreadful, Dull, Horrible, Nagging, Pressure-like, Sharp, Shooting, Tender, Tingling, Toothache-like and Uncomfortable Previous Examinations or Tests: EMG/PNCV, X-rays, Nerve conduction test and Orthopedic evaluation Previous Treatments: Physical Therapy, Relaxation therapy, Steroid treatments by mouth and Strengthening exercises  The patient comes into the clinics today for the first time for a chronic pain management evaluation.   Patient is a pleasant 48 year old female who presents with a chief complaint of left low back, left hip pain that radiates into her left lateral thigh.  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  Patient went on to have lumbar spine surgery with Dr. Lacinda Axon February 2020 which resulted in interval left hemilaminectomy and partial facet ectomy at L4-L5.  Patient continues  to have persistent pain in her left leg.  She describes it as  burning, throbbing, tingling.  She has tried physical therapy in the past.  She is on steroid treatments by mouth.  She has seen Dr. Lacinda Axon for her current complaints since her surgery and he recommended no further surgical intervention at this time.  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  She states that this provided her with approximately 1 week of pain relief.  She denies having done a lumbar epidural steroid injection for symptoms.   She states that her current symptoms are similar to the symptoms that she was experiencing prior to her lumbar spine surgery in February 2020.  Patient's left hip x-ray was unremarkable.  She has seen Dr. Thomasene Lot in orthopedics regarding this.  She was offered a left hip MRI as well.  She is on Lyrica 50 mg twice daily along with Robaxin.  She feels that the Lyrica dose needs to be stronger.  She is not on chronic opioid therapy.   Meds   Current Outpatient Medications:  .  calcium carbonate (TUMS - DOSED IN MG ELEMENTAL CALCIUM) 500 MG chewable tablet, Chew 1 tablet by mouth as needed for indigestion or heartburn., Disp: , Rfl:  .  ibuprofen (ADVIL) 600 MG tablet, TAKE (1) TABLET EVERY SIX HOURS AS NEEDED., Disp: 30 tablet, Rfl: 1 .  lisinopril (PRINIVIL,ZESTRIL) 10 MG tablet, Take 10 mg by mouth every morning. , Disp: , Rfl:  .  metFORMIN (GLUCOPHAGE) 500 MG tablet, Take 500-1,000 mg by mouth See admin instructions. Take 500 mg by mouth in the morning and 1000 mg in the evening., Disp: , Rfl: 1 .  methocarbamol (ROBAXIN) 750 MG tablet, Take 1 tablet (750 mg total) by mouth every 8 (eight) hours as needed for muscle spasms., Disp: 90 tablet, Rfl: 1 .  pregabalin (LYRICA) 75 MG capsule, Take 1 capsule (75 mg total) by mouth 2 (two) times daily., Disp: 60 capsule, Rfl: 1  Imaging Review    Lumbosacral Imaging: Lumbar MR wo contrast:  Results for orders  placed during the hospital encounter of 08/08/18  MR LUMBAR SPINE WO CONTRAST   Narrative CLINICAL DATA:  Low back pain. History of previous cyst removal in February. Sharp left-sided pain symptoms.  EXAM: MRI LUMBAR SPINE WITHOUT CONTRAST  TECHNIQUE: Multiplanar, multisequence MR imaging of the lumbar spine was performed. No intravenous contrast was administered.  COMPARISON:  Operative imaging 06/24/2018.  MRI 05/04/2018.  FINDINGS: Segmentation:  5 lumbar type vertebral bodies.  Alignment:  Normal except for 2 mm retrolisthesis L3-4, unchanged.  Vertebrae:  No fracture or primary bone lesion.  Conus medullaris and cauda equina: Conus extends to the L1 level. Conus and cauda equina appear normal.  Paraspinal and other soft tissues: Soft tissue changes related to the posterior protrude for left-sided surgery at L4-5. No evidence of fluid collection or unexpected edema.  Disc levels:  No disc abnormality at L2-3 or above. Mild facet hypertrophy at L2-3. No stenosis.  L3-4: Bulging of the disc more prominent towards the right. Mild facet and ligamentous hypertrophy. Mild narrowing of the right lateral recess and intervertebral foramen on the right, similar to the previous study.  L4-5: Interval left hemilaminectomy and partial facetectomy. Bulging of the disc slightly more prominent towards the left. Previously seen synovial cyst has probably been successfully resected, though contrast administration would have  been useful for characterization. There is left central canal stenosis. Patient does have some persistent left lateral recess and proximal foraminal stenosis that would have potential to have ongoing left-sided compressive symptoms. Facet effusion on the left could relate to painful facet arthropathy.  L5-S1: Mild bulging of the disc. Mild bilateral facet hypertrophy. No compressive stenosis. The facet osteoarthritis could contribute to low back  pain.  IMPRESSION: Interval left hemilaminectomy and partial facetectomy at L4-5. There appears to have been successful resection of the synovial cyst and there is less stenosis of the central canal. The patient does show mild bulging of the disc more prominent towards the left and some residual facet hypertrophy, with ongoing narrowing of the lateral recesses and foramina left more than right. Some potential for ongoing left-sided symptoms secondary to the findings at this level.  L3-4: Disc bulge more prominent towards the right. Mild right lateral recess and foraminal narrowing but without definite neural compression. No change.  L5-S1: Mild bulging of the disc. Mild facet hypertrophy. No apparent compressive stenosis. No change.   Electronically Signed   By: Nelson Chimes M.D.   On: 08/08/2018 16:43    Lumbar MR w/wo contrast:  Results for orders placed during the hospital encounter of 05/04/18  MR Lumbar Spine W Wo Contrast   Narrative CLINICAL DATA:  Worsening low back pain on the left radiating to the buttock.  EXAM: MRI LUMBAR SPINE WITHOUT AND WITH CONTRAST  TECHNIQUE: Multiplanar and multiecho pulse sequences of the lumbar spine were obtained without and with intravenous contrast.  CONTRAST:  7.5 cc Gadavist  COMPARISON:  None.  FINDINGS: Segmentation:  5 lumbar type vertebral bodies.  Alignment:  2 mm retrolisthesis L3-4.  2 mm anterolisthesis L4-5.  Vertebrae:  No fracture or primary bone finding.  Conus medullaris and cauda equina: Conus extends to the L1-2 level. Conus and cauda equina appear normal.  Paraspinal and other soft tissues: Negative  Disc levels:  No abnormality at L2-3 or above.  L3-4: Mild facet hypertrophy. 2 mm of retrolisthesis. Bulging of the disc more prominent towards the right. Right foraminal to extraforaminal encroachment that could possibly affect the right L3 nerve. No compressive central canal stenosis.  L4-5:  Bilateral facet arthropathy worse on the left than the right. 1 cm synovial cyst projecting inward from the facet on the left. Circumferential bulging of the disc. Spinal stenosis at this level, worse on the left than the right because of the synovial cyst. Neural compression is likely, particularly on the left.  L5-S1: Noncompressive disc bulge. Mild bilateral facet osteoarthritis. No stenosis.  IMPRESSION: L4-5: Advanced bilateral facet arthropathy worse on the left, with a 1 cm cyst projecting inward from the left facet joint. Bulging of the disc. Multifactorial spinal stenosis, more severe on the left than the right because of the synovial cyst. Neural compression is likely at this level, particularly on the left.  L3-4: Mild facet hypertrophy. Disc bulge more prominent towards the right. Right foraminal to extraforaminal prominence could affect the right L3 nerve, though definite compression is not demonstrated.  L5-S1: Disc bulge and facet arthritis but without stenosis.   Electronically Signed   By: Nelson Chimes M.D.   On: 05/04/2018 14:12    Results for orders placed during the hospital encounter of 06/24/18  DG Lumbar Spine 2-3 Views   Narrative CLINICAL DATA:  Lumbar hemilaminectomy, foraminotomy, cyst removal, L4-5.  EXAM: LUMBAR SPINE - 2-3 VIEW; DG C-ARM 61-120 MIN  COMPARISON:  None.  FINDINGS:  Final image demonstrates surgical instrument with tip over the spinal canal at the L5 level. Body heights and disc spaces are within normal. There is subtle spondylosis of the lumbar spine.  IMPRESSION: Surgical instrument with tip over the spinal canal at the L5 level.   Electronically Signed   By: Marin Olp M.D.   On: 06/24/2018 12:15     Complexity Note: Imaging results reviewed. Results shared with Sherry Hobbs, using Layman's terms.                         ROS  Cardiovascular: High blood pressure Pulmonary or Respiratory: Snoring  Neurological:  Incontinence:  Urinary Review of Past Neurological Studies:  Results for orders placed or performed during the hospital encounter of 10/04/14  CT Head Wo Contrast   Narrative   CLINICAL DATA:  Forehead trauma, laceration, dizziness  EXAM: CT HEAD WITHOUT CONTRAST  TECHNIQUE: Contiguous axial images were obtained from the base of the skull through the vertex without intravenous contrast.  COMPARISON:  None.  FINDINGS: No evidence of parenchymal hemorrhage or extra-axial fluid collection. No mass lesion, mass effect, or midline shift.  No CT evidence of acute infarction.  Cerebral volume is within normal limits.  No ventriculomegaly.  The visualized paranasal sinuses are essentially clear. The mastoid air cells are unopacified.  No evidence of calvarial fracture.  IMPRESSION: Normal head CT.   Electronically Signed   By: Julian Hy M.D.   On: 10/04/2014 15:01    Psychological-Psychiatric: No reported psychological or psychiatric signs or symptoms such as difficulty sleeping, anxiety, depression, delusions or hallucinations (schizophrenial), mood swings (bipolar disorders) or suicidal ideations or attempts Gastrointestinal: No reported gastrointestinal signs or symptoms such as vomiting or evacuating blood, reflux, heartburn, alternating episodes of diarrhea and constipation, inflamed or scarred liver, or pancreas or irrregular and/or infrequent bowel movements Genitourinary: No reported renal or genitourinary signs or symptoms such as difficulty voiding or producing urine, peeing blood, non-functioning kidney, kidney stones, difficulty emptying the bladder, difficulty controlling the flow of urine, or chronic kidney disease Hematological: Weakness due to low blood hemoglobin or red blood cell count (Anemia) and Brusing easily Endocrine: High blood sugar controlled without the use of insulin (NIDDM) Rheumatologic: No reported rheumatological signs and symptoms such as  fatigue, joint pain, tenderness, swelling, redness, heat, stiffness, decreased range of motion, with or without associated rash Musculoskeletal: Negative for myasthenia gravis, muscular dystrophy, multiple sclerosis or malignant hyperthermia Work History: Working full time  Allergies  Sherry Hobbs is allergic to codeine.  Laboratory Chemistry Profile   Screening Lab Results  Component Value Date   STAPHAUREUS NEGATIVE 06/24/2018   MRSAPCR NEGATIVE 06/24/2018   HCVAB <0.1 03/30/2016   HIV NON REACTIVE 03/30/2016   PREGTESTUR NEGATIVE 06/24/2018    Renal Lab Results  Component Value Date   BUN 14 06/07/2018   CREATININE 1.09 (H) 06/07/2018   GFRAA >60 06/07/2018   GFRNONAA >60 06/07/2018                             Hepatic Lab Results  Component Value Date   AST 20 05/02/2016   ALT 16 05/02/2016   ALBUMIN 4.0 05/02/2016   ALKPHOS 72 05/02/2016   HCVAB <0.1 03/30/2016                        Electrolytes Lab Results  Component Value Date  NA 138 06/07/2018   K 3.9 06/07/2018   CL 105 06/07/2018   CALCIUM 9.6 06/07/2018                        Neuropathy Lab Results  Component Value Date   HIV NON REACTIVE 03/30/2016                        Coagulation Lab Results  Component Value Date   INR 0.97 06/07/2018   LABPROT 12.8 06/07/2018   APTT 26 06/07/2018   PLT 287 06/07/2018                        Cardiovascular Lab Results  Component Value Date   TROPONINI <0.03 03/30/2016   HGB 12.1 06/07/2018   HCT 35.9 (L) 06/07/2018                         ID Lab Results  Component Value Date   HIV NON REACTIVE 03/30/2016   STAPHAUREUS NEGATIVE 06/24/2018   MRSAPCR NEGATIVE 06/24/2018   HCVAB <0.1 03/30/2016   PREGTESTUR NEGATIVE 06/24/2018    Cancer No results found for: CEA, CA125, LABCA2                      Endocrine Lab Results  Component Value Date   TSH 3.717 03/30/2016                        Note: Lab results reviewed.  [place]  Drug: Ms.  Hobbs  reports no history of drug use. Alcohol:  reports no history of alcohol use. Tobacco:  reports that she has never smoked. She has never used smokeless tobacco. Medical:  has a past medical history of Anemia, Arthritis, Depression, Diabetes mellitus without complication ([place]), Elevated serum creatinine, GERD (gastroesophageal reflux disease), Headache, History of blood transfusion (05/02/2016), and Hypertension. Family: family history includes Bladder Cancer in her maternal grandfather; Brain cancer in her maternal uncle; CAD in her brother and father; COPD in her mother; Diabetes in her brother, maternal grandmother, mother, and sister; Emphysema in her mother; Osteoporosis in her mother; Renal cancer in her maternal uncle; Stroke in her maternal grandmother.  Past Surgical History:  Procedure Laterality Date  . DILATION AND CURETTAGE OF UTERUS  05/03/2016   Procedure: DILATATION AND CURETTAGE;  Surgeon: [name], MD;  Location: [facility] ORS;  Service: Gynecology;;  . Murrell Redden & CURRETTAGE/HYSTROSCOPY WITH NOVASURE ABLATION N/A 12/03/2017   Procedure: DILATATION & CURETTAGE/HYSTEROSCOPY WITH MINERVA ABLATION;  Surgeon: [name], MD;  Location: [facility] ORS;  Service: Gynecology;  Laterality: N/A;  MINERVA REP CONTACTED [facility] [phone])  . LUMBAR LAMINECTOMY/DECOMPRESSION MICRODISCECTOMY Left 06/24/2018   Procedure: LUMBAR HEMILAMINECTOMY, FORAMINOTOMY, CYST REMOVAL - 1 LEVEL LEFT L4-5;  Surgeon: [name], MD;  Location: [facility] ORS;  Service: Neurosurgery;  Laterality: Left;   Active Ambulatory Problems    Diagnosis Date Noted  . Anemia 03/30/2016  . DUB (dysfunctional uterine bleeding) 03/31/2016  . Ovarian cyst 03/31/2016  . History of blood transfusion 05/02/2016  . History of lumbar surgery (L4-L5 laminectomy for cyst removal Dr Lacinda Axon 06/2018) 02/25/2019  . Chronic radicular lumbar pain (left) 02/25/2019  . Lumbar radiculopathy (left) 02/25/2019  . Lumbar facet  arthropathy 02/25/2019  . Chronic pain syndrome 02/25/2019   Resolved Ambulatory Problems    Diagnosis Date Noted  . No  Resolved Ambulatory Problems   Past Medical History:  Diagnosis Date  . Arthritis   . Depression   . Diabetes mellitus without complication ([place])   . Elevated serum creatinine   . GERD (gastroesophageal reflux disease)   . Headache   . Hypertension    Constitutional Exam  General appearance: Well nourished, well developed, and well hydrated. In no apparent acute distress Vitals:   02/25/19 0927  BP: (!) 140/96  Pulse: 77  Resp: 18  Temp: 98.4 F (36.9 C)  SpO2: 100%  Weight: 193 lb (87.5 kg)  Height: 5\' 2"  (1.575 m)   BMI Assessment: Estimated body mass index is 35.3 kg/m as calculated from the following:   Height as of this encounter: 5\' 2"  (1.575 m).   Weight as of this encounter: 193 lb (87.5 kg).  BMI interpretation table: BMI level Category Range association with higher incidence of chronic pain  <18 kg/m2 Underweight   18.5-24.9 kg/m2 Ideal body weight   25-29.9 kg/m2 Overweight Increased incidence by 20%  30-34.9 kg/m2 Obese (Class I) Increased incidence by 68%  35-39.9 kg/m2 Severe obesity (Class II) Increased incidence by 136%  >40 kg/m2 Extreme obesity (Class III) Increased incidence by 254%   Patient's current BMI Ideal Body weight  Body mass index is 35.3 kg/m. Ideal body weight: 50.1 kg (110 lb 7.2 oz) Adjusted ideal body weight: 65.1 kg (143 lb 7.5 oz)   BMI Readings from Last 4 Encounters:  02/25/19 35.30 kg/m  06/24/18 34.75 kg/m  06/07/18 34.75 kg/m  12/11/17 34.26 kg/m   Wt Readings from Last 4 Encounters:  02/25/19 193 lb (87.5 kg)  06/24/18 190 lb (86.2 kg)  06/07/18 190 lb (86.2 kg)  12/11/17 187 lb 4.8 oz (85 kg)  Psych/Mental status: Alert, oriented x 3 (person, place, & time)       Eyes: PERLA Respiratory: No evidence of acute respiratory distress  Cervical Spine Area Exam  Skin & Axial Inspection: No  masses, redness, edema, swelling, or associated skin lesions Alignment: Symmetrical Functional ROM: Unrestricted ROM      Stability: No instability detected Muscle Tone/Strength: Functionally intact. No obvious neuro-muscular anomalies detected. Sensory (Neurological): Unimpaired Palpation: No palpable anomalies              Upper Extremity (UE) Exam    Side: Right upper extremity  Side: Left upper extremity  Skin & Extremity Inspection: Skin color, temperature, and hair growth are WNL. No peripheral edema or cyanosis. No masses, redness, swelling, asymmetry, or associated skin lesions. No contractures.  Skin & Extremity Inspection: Skin color, temperature, and hair growth are WNL. No peripheral edema or cyanosis. No masses, redness, swelling, asymmetry, or associated skin lesions. No contractures.  Functional ROM: Unrestricted ROM          Functional ROM: Unrestricted ROM          Muscle Tone/Strength: Functionally intact. No obvious neuro-muscular anomalies detected.  Muscle Tone/Strength: Functionally intact. No obvious neuro-muscular anomalies detected.  Sensory (Neurological): Unimpaired          Sensory (Neurological): Unimpaired          Palpation: No palpable anomalies              Palpation: No palpable anomalies              Provocative Test(s):  Phalen's test: deferred Tinel's test: deferred Apley's scratch test (touch opposite shoulder):  Action 1 (Across chest): deferred Action 2 (Overhead): deferred Action 3 (LB reach): deferred  Provocative Test(s):  Phalen's test: deferred Tinel's test: deferred Apley's scratch test (touch opposite shoulder):  Action 1 (Across chest): deferred Action 2 (Overhead): deferred Action 3 (LB reach): deferred    Thoracic Spine Area Exam  Skin & Axial Inspection: No masses, redness, or swelling Alignment: Symmetrical Functional ROM: Unrestricted ROM Stability: No instability detected Muscle Tone/Strength: Functionally intact. No obvious  neuro-muscular anomalies detected. Sensory (Neurological): Unimpaired Muscle strength & Tone: No palpable anomalies  Lumbar Spine Area Exam  Skin & Axial Inspection: Well healed scar from previous spine surgery detected Alignment: Symmetrical Functional ROM: Pain restricted ROM       Stability: No instability detected Muscle Tone/Strength: Functionally intact. No obvious neuro-muscular anomalies detected. Sensory (Neurological): Dermatomal pain pattern left Palpation: No palpable anomalies       Provocative Tests: Hyperextension/rotation test: deferred today       Lumbar quadrant test (Kemp's test): deferred today       Lateral bending test: (+) ipsilateral radicular pain, on the left. Positive for left-sided foraminal stenosis. Patrick's Maneuver: deferred today                   FABER* test: deferred today                   S-I anterior distraction/compression test: deferred today         S-I lateral compression test: deferred today         S-I Thigh-thrust test: deferred today         S-I Gaenslen's test: deferred today         *(Flexion, ABduction and External Rotation)  Gait & Posture Assessment  Ambulation: Unassisted Gait: Relatively normal for age and body habitus Posture: WNL   Lower Extremity Exam    Side: Right lower extremity  Side: Left lower extremity  Stability: No instability observed          Stability: No instability observed          Skin & Extremity Inspection: Skin color, temperature, and hair growth are WNL. No peripheral edema or cyanosis. No masses, redness, swelling, asymmetry, or associated skin lesions. No contractures.  Skin & Extremity Inspection: Skin color, temperature, and hair growth are WNL. No peripheral edema or cyanosis. No masses, redness, swelling, asymmetry, or associated skin lesions. No contractures.  Functional ROM: Unrestricted ROM                  Functional ROM: Pain restricted ROM for hip and knee joints          Muscle Tone/Strength:  Functionally intact. No obvious neuro-muscular anomalies detected.  Muscle Tone/Strength: Functionally intact. No obvious neuro-muscular anomalies detected.  Sensory (Neurological): Unimpaired        Sensory (Neurological): Dermatomal pain pattern        DTR: Patellar: 1+: trace Achilles: 1+: trace Plantar: deferred today  DTR: Patellar: 1+: trace Achilles: 1+: trace Plantar: deferred today  Palpation: No palpable anomalies  Palpation: No palpable anomalies   Assessment  Primary Diagnosis & Pertinent Problem List: The primary encounter diagnosis was History of lumbar surgery (L4-L5 laminectomy for cyst removal Dr Lacinda Axon 06/2018). Diagnoses of Chronic radicular lumbar pain (left), Lumbar radiculopathy (left), Lumbar facet arthropathy, and Chronic pain syndrome were also pertinent to this visit.  Visit Diagnosis (New problems to examiner): 1. History of lumbar surgery (L4-L5 laminectomy for cyst removal Dr Lacinda Axon 06/2018)   2. Chronic radicular lumbar pain (left)   3. Lumbar radiculopathy (left)  4. Lumbar facet arthropathy   5. Chronic pain syndrome    Plan of Care (Initial workup plan)  Note:       General Recommendations: The pain condition that the patient suffers from is best treated with a multidisciplinary approach that involves an increase in physical activity to prevent de-conditioning and worsening of the pain cycle, as well as psychological counseling (formal and/or informal) to address the co-morbid psychological affects of pain. Treatment will often involve judicious use of pain medications and interventional procedures to decrease the pain, allowing the patient to participate in the physical activity that will ultimately produce long-lasting pain reductions. The goal of the multidisciplinary approach is to return the patient to a higher level of overall function and to restore their ability to perform activities of daily living.  Patient is a pleasant 48 year old female who presents  with a chief complaint of left low back, left hip pain that radiates into her left lateral thigh.  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  Patient went on to have lumbar spine surgery with Dr. Lacinda Axon February 2020 which resulted in interval left hemilaminectomy and partial facet ectomy at L4-L5.  Patient continues to have persistent pain in her left leg.  She describes it as burning, throbbing, tingling.  She has tried physical therapy in the past.  She is on steroid treatments by mouth.  She has seen Dr. Lacinda Axon for her current complaints since her surgery and he recommended no further surgical intervention at this time.  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  She states that this provided her with approximately 1 week of pain relief.  She denies having done a lumbar epidural steroid injection for symptoms. She states that her current symptoms are similar to the symptoms that she was experiencing prior to her lumbar spine surgery in February 2020.  Patient's left hip x-ray was unremarkable.  She has seen Dr. Thomasene Lot in orthopedics regarding this.  She was offered a left hip MRI as well. She is on Lyrica 50 mg twice daily along with Robaxin.  She feels that the Lyrica dose needs to be stronger.  She is not on chronic opioid therapy.  Plan: -Discussed interventional pain therapies including diagnostic lumbar epidural steroid injection.  We will avoid the L4-L5 interlaminar space given history of left L4-L5 laminectomy and facetectomy.  Will consider interlaminar approach at L3-L4 L5-S1. -Briefly discussed spinal cord stimulator therapy for her symptoms.  We will have patient discuss with Dr. Lacinda Axon as well if this is a route that we take.  Can continue conversation in the future. -Recommend avoiding opioid  therapy. -Increase Lyrica as below to 75 mg twice daily, Robaxin 750 mg 3 times daily as needed.   Procedure Orders     Lumbar Epidural Injection Pharmacotherapy (current): Medications ordered:  Meds ordered this encounter  Medications  . methocarbamol (ROBAXIN) 750 MG tablet    Sig: Take 1 tablet (750 mg total) by mouth every 8 (eight) hours as needed for muscle spasms.    Dispense:  90 tablet    Refill:  1    Do not place this medication, or any other prescription from our practice, on "Automatic Refill". Patient may have prescription filled one day early if pharmacy is closed on scheduled refill  date.  . pregabalin (LYRICA) 75 MG capsule    Sig: Take 1 capsule (75 mg total) by mouth 2 (two) times daily.    Dispense:  60 capsule    Refill:  1    Do not place this medication, or any other prescription from our practice, on "Automatic Refill". Patient may have prescription filled one day early if pharmacy is closed on scheduled refill date.   Medications administered during this visit: Shana Chute had no medications administered during this visit.   Pharmacological management options:  Opioid Analgesics: The patient was informed that there is no guarantee that she would be a candidate for opioid analgesics. The decision will be made following CDC guidelines. This decision will be based on the results of diagnostic studies, as well as Sherry Hobbs's risk profile.   Membrane stabilizer: Adequate regimen  Muscle relaxant: Adequate regimen  NSAID: To be determined at a later time  Other analgesic(s): To be determined at a later time   Interventional management options: Sherry Hobbs was informed that there is no guarantee that she would be a candidate for interventional therapies. The decision will be based on the results of diagnostic studies, as well as Sherry Hobbs's risk profile.  Procedure(s) under consideration:  Lumbar epidural steroid injection at L3-L4 L5-S1 Thoracolumbar SCS:  Spinal cord stimulator trial.  Will need thoracic MRI and psych eval.   Provider-requested follow-up: Return in 2 weeks (on 03/11/2019) for Procedure L3/4 OR L5/S1 ESI w/o sedation.  No future appointments.  Primary Care Physician: Theotis Burrow, MD Location: [facility] Note by: Gillis Santa, MD Date: 02/25/2019; Time: 12:13 PM  Note: This dictation was prepared with Dragon dictation. Any transcriptional errors that may result from this process are unintentional.

## 2019-02-27 ENCOUNTER — Telehealth: Payer: Self-pay | Admitting: *Deleted

## 2019-02-27 NOTE — Telephone Encounter (Signed)
Dr Holley Raring, [facility] pharmacy will not fill the medications sent in because the Dr Reather Laurence did not refer her to [place]. This is what the patient is telling me.  I attempted to call the pharmacy to get clarification and I could not get any answer.

## 2019-03-03 MED ORDER — METHOCARBAMOL 750 MG PO TABS: 750.0000 mg | ORAL_TABLET | Freq: Three times a day (TID) | ORAL | 1 refills | Status: AC | PRN

## 2019-03-03 MED ORDER — PREGABALIN 75 MG PO CAPS: 75.0000 mg | ORAL_CAPSULE | Freq: Two times a day (BID) | ORAL | 1 refills | Status: DC

## 2019-03-03 NOTE — Telephone Encounter (Signed)
Patient notified

## 2019-03-03 NOTE — Telephone Encounter (Signed)
Rx sent to [facility] pharmacy as requested  Requested Prescriptions   Signed Prescriptions Disp Refills  . methocarbamol (ROBAXIN) 750 MG tablet 90 tablet 1    Sig: Take 1 tablet (750 mg total) by mouth every 8 (eight) hours as needed for muscle spasms.    Authorizing Provider: Gillis Santa  . pregabalin (LYRICA) 75 MG capsule 60 capsule 1    Sig: Take 1 capsule (75 mg total) by mouth 2 (two) times daily.    Authorizing Provider: Gillis Santa

## 2019-03-05 ENCOUNTER — Other Ambulatory Visit: Payer: Self-pay

## 2019-03-05 ENCOUNTER — Encounter: Payer: Self-pay | Admitting: Student in an Organized Health Care Education/Training Program

## 2019-03-05 ENCOUNTER — Ambulatory Visit
Admission: RE | Admit: 2019-03-05 | Discharge: 2019-03-05 | Disposition: A | Discharge status: Home / Self Care | Payer: Managed Care, Other (non HMO) | Source: Ambulatory Visit | Attending: Student in an Organized Health Care Education/Training Program | Admitting: Student in an Organized Health Care Education/Training Program

## 2019-03-05 ENCOUNTER — Ambulatory Visit (HOSPITAL_BASED_OUTPATIENT_CLINIC_OR_DEPARTMENT_OTHER): Payer: Managed Care, Other (non HMO) | Admitting: Student in an Organized Health Care Education/Training Program

## 2019-03-05 DIAGNOSIS — M5416 Radiculopathy, lumbar region: Secondary | ICD-10-CM

## 2019-03-05 DIAGNOSIS — G8929 Other chronic pain: Secondary | ICD-10-CM | POA: Diagnosis not present

## 2019-03-05 MED ORDER — IOHEXOL 180 MG/ML  SOLN
10.0000 mL | Freq: Once | INTRAMUSCULAR | Status: AC
  Administered 2019-03-05: 10 mL via EPIDURAL
  Filled 2019-03-05: qty 20

## 2019-03-05 MED ORDER — LIDOCAINE HCL 2 % IJ SOLN
20.0000 mL | Freq: Once | INTRAMUSCULAR | Status: AC
  Administered 2019-03-05: 12:00:00 400 mg
  Filled 2019-03-05: qty 40

## 2019-03-05 MED ORDER — SODIUM CHLORIDE 0.9% FLUSH
1.0000 mL | Freq: Once | INTRAVENOUS | Status: AC
  Administered 2019-03-05: 12:00:00

## 2019-03-05 MED ORDER — ROPIVACAINE HCL 2 MG/ML IJ SOLN
1.0000 mL | Freq: Once | INTRAMUSCULAR | Status: AC
  Administered 2019-03-05: 10 mL via EPIDURAL
  Filled 2019-03-05: qty 10

## 2019-03-05 MED ORDER — DEXAMETHASONE SODIUM PHOSPHATE 10 MG/ML IJ SOLN
10.0000 mg | Freq: Once | INTRAMUSCULAR | Status: AC
  Administered 2019-03-05: 10 mg
  Filled 2019-03-05: qty 1

## 2019-03-05 NOTE — Patient Instructions (Signed)
Pain Management Discharge Instructions  General Discharge Instructions :  If you need to reach your doctor call: Monday-Friday 8:00 am - 4:00 pm at [phone] or toll free [phone].  After clinic hours [phone] to have operator reach doctor.  Bring all of your medication bottles to all your appointments in the pain clinic.  To cancel or reschedule your appointment with Pain Management please remember to call 24 hours in advance to avoid a fee.  Refer to the educational materials which you have been given on: General Risks, I had my Procedure. Discharge Instructions, Post Sedation.  Post Procedure Instructions:  The drugs you were given will stay in your system until tomorrow, so for the next 24 hours you should not drive, make any legal decisions or drink any alcoholic beverages.  You may eat anything you prefer, but it is better to start with liquids then soups and crackers, and gradually work up to solid foods.  Please notify your doctor immediately if you have any unusual bleeding, trouble breathing or pain that is not related to your normal pain.  Depending on the type of procedure that was done, some parts of your body may feel week and/or numb.  This usually clears up by tonight or the next day.  Walk with the use of an assistive device or accompanied by an adult for the 24 hours.  You may use ice on the affected area for the first 24 hours.  Put ice in a Ziploc bag and cover with a towel and place against area 15 minutes on 15 minutes off.  You may switch to heat after 24 hours.Epidural Steroid Injection Patient Information  Description: The epidural space surrounds the nerves as they exit the spinal cord.  In some patients, the nerves can be compressed and inflamed by a bulging disc or a tight spinal canal (spinal stenosis).  By injecting steroids into the epidural space, we can bring irritated nerves into direct contact with a potentially helpful medication.  These  steroids act directly on the irritated nerves and can reduce swelling and inflammation which often leads to decreased pain.  Epidural steroids may be injected anywhere along the spine and from the neck to the low back depending upon the location of your pain.   After numbing the skin with local anesthetic (like Novocaine), a small needle is passed into the epidural space slowly.  You may experience a sensation of pressure while this is being done.  The entire block usually last less than 10 minutes.  Conditions which may be treated by epidural steroids:   Low back and leg pain  Neck and arm pain  Spinal stenosis  Post-laminectomy syndrome  Herpes zoster (shingles) pain  Pain from compression fractures  Preparation for the injection:  1. Do not eat any solid food or dairy products within 8 hours of your appointment.  2. You may drink clear liquids up to 3 hours before appointment.  Clear liquids include water, black coffee, juice or soda.  No milk or cream please. 3. You may take your regular medication, including pain medications, with a sip of water before your appointment  Diabetics should hold regular insulin (if taken separately) and take 1/2 normal NPH dos the morning of the procedure.  Carry some sugar containing items with you to your appointment. 4. A driver must accompany you and be prepared to drive you home after your procedure.  5. Bring all your current medications with your. 6. An IV may be inserted and   sedation may be given at the discretion of the physician.   7. A blood pressure cuff, EKG and other monitors will often be applied during the procedure.  Some patients may need to have extra oxygen administered for a short period. 8. You will be asked to provide medical information, including your allergies, prior to the procedure.  We must know immediately if you are taking blood thinners (like Coumadin/Warfarin)  Or if you are allergic to IV iodine contrast (dye). We must  know if you could possible be pregnant.  Possible side-effects:  Bleeding from needle site  Infection (rare, may require surgery)  Nerve injury (rare)  Numbness & tingling (temporary)  Difficulty urinating (rare, temporary)  Spinal headache ( a headache worse with upright posture)  Light -headedness (temporary)  Pain at injection site (several days)  Decreased blood pressure (temporary)  Weakness in arm/leg (temporary)  Pressure sensation in back/neck (temporary)  Call if you experience:  Fever/chills associated with headache or increased back/neck pain.  Headache worsened by an upright position.  New onset weakness or numbness of an extremity below the injection site  Hives or difficulty breathing (go to the emergency room)  Inflammation or drainage at the infection site  Severe back/neck pain  Any new symptoms which are concerning to you  Please note:  Although the local anesthetic injected can often make your back or neck feel good for several hours after the injection, the pain will likely return.  It takes 3-7 days for steroids to work in the epidural space.  You may not notice any pain relief for at least that one week.  If effective, we will often do a series of three injections spaced 3-6 weeks apart to maximally decrease your pain.  After the initial series, we generally will wait several months before considering a repeat injection of the same type.  If you have any questions, please call [phone] [facility] 

## 2019-03-05 NOTE — Progress Notes (Signed)
Safety precautions to be maintained throughout the outpatient stay will include: orient to surroundings, keep bed in low position, maintain call bell within reach at all times, provide assistance with transfer out of bed and ambulation.  

## 2019-03-05 NOTE — Progress Notes (Signed)
Patient's Name: Sherry Hobbs  MRN: [id number]  Referring Provider: Gillis Santa, MD  DOB: 11-14-1970  PCP: Theotis Burrow, MD  DOS: 03/05/2019  Note by: Gillis Santa, MD  Service setting: Ambulatory outpatient  Specialty: Interventional Pain Management  Patient type: Established  Location: [facility] (AMB) Pain Management Facility  Visit type: Interventional Procedure   Primary Reason for Visit: Interventional Pain Management Treatment. CC: Back Pain (left)  Procedure:          Anesthesia, Analgesia, Anxiolysis:  Type: Diagnostic Inter-Laminar Epidural Steroid Injection  #1  Region: Lumbar Level: L5-S1 Level. Laterality: Left-Sided         Type: Local Anesthesia  Local Anesthetic: Lidocaine 1-2%  Position: Prone with head of the table was raised to facilitate breathing.   Indications: 1. Chronic radicular lumbar pain (left)    History of L4-L5 laminectomy for synovial cyst now having left leg pain related to left L4-L5 radiculopathy.  Avoid L4-L5 due to laminectomy.  Pain Score: Pre-procedure: 5 /10 Post-procedure: 5 /10   Pre-op Assessment:  Sherry Hobbs is a 48 y.o. (year old), female patient, seen today for interventional treatment. She  has a past surgical history that includes Dilation and curettage of uterus (05/03/2016); Dilatation & currettage/hysteroscopy with novasure ablation (N/A, 12/03/2017); and Lumbar laminectomy/decompression microdiscectomy (Left, 06/24/2018). Sherry Hobbs has a current medication list which includes the following prescription(s): calcium carbonate, ibuprofen, lisinopril, metformin, methocarbamol, and pregabalin. Her primarily concern today is the Back Pain (left)  Initial Vital Signs:  Pulse/HCG Rate: 78ECG Heart Rate: 73 Temp: 98 F (36.7 C) Resp: 16 BP: (!) 159/80 SpO2: 99 %  BMI: Estimated body mass index is 35.3 kg/m as calculated from the following:   Height as of this encounter: 5\' 2"  (1.575 m).   Weight as of this encounter: 193 lb  (87.5 kg).  Risk Assessment: Allergies: Reviewed. She is allergic to codeine.  Allergy Precautions: None required Coagulopathies: Reviewed. None identified.  Blood-thinner therapy: None at this time Active Infection(s): Reviewed. None identified. Sherry Hobbs is afebrile  Site Confirmation: Sherry Hobbs was asked to confirm the procedure and laterality before marking the site Procedure checklist: Completed Consent: Before the procedure and under the influence of no sedative(s), amnesic(s), or anxiolytics, the patient was informed of the treatment options, risks and possible complications. To fulfill our ethical and legal obligations, as recommended by the American Medical Association's Code of Ethics, I have informed the patient of my clinical impression; the nature and purpose of the treatment or procedure; the risks, benefits, and possible complications of the intervention; the alternatives, including doing nothing; the risk(s) and benefit(s) of the alternative treatment(s) or procedure(s); and the risk(s) and benefit(s) of doing nothing. The patient was provided information about the general risks and possible complications associated with the procedure. These may include, but are not limited to: failure to achieve desired goals, infection, bleeding, organ or nerve damage, allergic reactions, paralysis, and death. In addition, the patient was informed of those risks and complications associated to Spine-related procedures, such as failure to decrease pain; infection (i.e.: Meningitis, epidural or intraspinal abscess); bleeding (i.e.: epidural hematoma, subarachnoid hemorrhage, or any other type of intraspinal or peri-dural bleeding); organ or nerve damage (i.e.: Any type of peripheral nerve, nerve root, or spinal cord injury) with subsequent damage to sensory, motor, and/or autonomic systems, resulting in permanent pain, numbness, and/or weakness of one or several areas of the body; allergic reactions;  (i.e.: anaphylactic reaction); and/or death. Furthermore, the patient was informed of those  risks and complications associated with the medications. These include, but are not limited to: allergic reactions (i.e.: anaphylactic or anaphylactoid reaction(s)); adrenal axis suppression; blood sugar elevation that in diabetics may result in ketoacidosis or comma; water retention that in patients with history of congestive heart failure may result in shortness of breath, pulmonary edema, and decompensation with resultant heart failure; weight gain; swelling or edema; medication-induced neural toxicity; particulate matter embolism and blood vessel occlusion with resultant organ, and/or nervous system infarction; and/or aseptic necrosis of one or more joints. Finally, the patient was informed that Medicine is not an exact science; therefore, there is also the possibility of unforeseen or unpredictable risks and/or possible complications that may result in a catastrophic outcome. The patient indicated having understood very clearly. We have given the patient no guarantees and we have made no promises. Enough time was given to the patient to ask questions, all of which were answered to the patient's satisfaction. Sherry Hobbs has indicated that she wanted to continue with the procedure. Attestation: I, the ordering provider, attest that I have discussed with the patient the benefits, risks, side-effects, alternatives, likelihood of achieving goals, and potential problems during recovery for the procedure that I have provided informed consent. Date  Time: 03/05/2019 10:30 AM  Pre-Procedure Preparation:  Monitoring: As per clinic protocol. Respiration, ETCO2, SpO2, BP, heart rate and rhythm monitor placed and checked for adequate function Safety Precautions: Patient was assessed for positional comfort and pressure points before starting the procedure. Time-out: I initiated and conducted the "Time-out" before starting  the procedure, as per protocol. The patient was asked to participate by confirming the accuracy of the "Time Out" information. Verification of the correct person, site, and procedure were performed and confirmed by me, the nursing staff, and the patient. "Time-out" conducted as per Joint Commission's Universal Protocol (UP.01.01.01). Time: 1137  Description of Procedure:          Target Area: The interlaminar space, initially targeting the lower laminar border of the superior vertebral body. Approach: Paramedial approach. Area Prepped: Entire Posterior Lumbar Region Prepping solution: DuraPrep (Iodine Povacrylex [0.7% available iodine] and Isopropyl Alcohol, 74% w/w) Safety Precautions: Aspiration looking for blood return was conducted prior to all injections. At no point did we inject any substances, as a needle was being advanced. No attempts were made at seeking any paresthesias. Safe injection practices and needle disposal techniques used. Medications properly checked for expiration dates. SDV (single dose vial) medications used. Description of the Procedure: Protocol guidelines were followed. The procedure needle was introduced through the skin, ipsilateral to the reported pain, and advanced to the target area. Bone was contacted and the needle walked caudad, until the lamina was cleared. The epidural space was identified using "loss-of-resistance technique" with 2-3 ml of PF-NaCl (0.9% NSS), in a 5cc LOR glass syringe.  Vitals:   03/05/19 1037 03/05/19 1137 03/05/19 1142 03/05/19 1145  BP: (!) 159/80 (!) 166/90  (!) 175/90  Pulse: 78     Resp: 16 16 20  (!) 21  Temp: 98 F (36.7 C)     SpO2: 99% 99% 97% 99%  Weight: 193 lb (87.5 kg)     Height: 5\' 2"  (1.575 m)       Start Time: 1137 hrs. End Time: 1144 hrs.  Materials:  Needle(s) Type: Epidural needle Gauge: 17G Length: 3.5-in Medication(s): Please see orders for medications and dosing details. 8 cc solution made of 5 cc of  preservative-free saline, 2 cc of 0.2% ropivacaine, 1 cc  of Decadron 10 mg/cc.  Imaging Guidance (Spinal):          Type of Imaging Technique: Fluoroscopy Guidance (Spinal) Indication(s): Assistance in needle guidance and placement for procedures requiring needle placement in or near specific anatomical locations not easily accessible without such assistance. Exposure Time: Please see nurses notes. Contrast: Before injecting any contrast, we confirmed that the patient did not have an allergy to iodine, shellfish, or radiological contrast. Once satisfactory needle placement was completed at the desired level, radiological contrast was injected. Contrast injected under live fluoroscopy. No contrast complications. See chart for type and volume of contrast used. Fluoroscopic Guidance: I was personally present during the use of fluoroscopy. "Tunnel Vision Technique" used to obtain the best possible view of the target area. Parallax error corrected before commencing the procedure. "Direction-depth-direction" technique used to introduce the needle under continuous pulsed fluoroscopy. Once target was reached, antero-posterior, oblique, and lateral fluoroscopic projection used confirm needle placement in all planes. Images permanently stored in EMR. Interpretation: I personally interpreted the imaging intraoperatively. Adequate needle placement confirmed in multiple planes. Appropriate spread of contrast into desired area was observed. No evidence of afferent or efferent intravascular uptake. No intrathecal or subarachnoid spread observed. Permanent images saved into the patient's record.  Antibiotic Prophylaxis:   Anti-infectives (From admission, onward)   None     Indication(s): None identified  Post-operative Assessment:  Post-procedure Vital Signs:  Pulse/HCG Rate: 7872 Temp: 98 F (36.7 C) Resp: (!) 21 BP: (!) 175/90 SpO2: 99 %  EBL: None  Complications: No immediate post-treatment  complications observed by team, or reported by patient.  Note: The patient tolerated the entire procedure well. A repeat set of vitals were taken after the procedure and the patient was kept under observation following institutional policy, for this type of procedure. Post-procedural neurological assessment was performed, showing return to baseline, prior to discharge. The patient was provided with post-procedure discharge instructions, including a section on how to identify potential problems. Should any problems arise concerning this procedure, the patient was given instructions to immediately contact us, at any time, without hesitation. In any case, we plan to contact the patient by telephone for a follow-up status report regarding this interventional procedure.  Comments:  No additional relevant information.  Plan of Care  Orders:  Orders Placed This Encounter  Procedures  . DG PAIN [facility] C-ARM 1-60 MIN NO REPORT    Intraoperative interpretation by procedural physician at [place].    Standing Status:   Standing    Number of Occurrences:   1    Order Specific Question:   Reason for exam:    Answer:   Assistance in needle guidance and placement for procedures requiring needle placement in or near specific anatomical locations not easily accessible without such assistance.   Medications ordered for procedure: Meds ordered this encounter  Medications  . iohexol (OMNIPAQUE) 180 MG/ML injection 10 mL    Must be Myelogram-compatible. If not available, you may substitute with a water-soluble, non-ionic, hypoallergenic, myelogram-compatible radiological contrast medium.  Marland Kitchen lidocaine (XYLOCAINE) 2 % (with pres) injection 400 mg  . dexamethasone (DECADRON) injection 10 mg  . ropivacaine (PF) 2 mg/mL (0.2%) (NAROPIN) injection 1 mL  . sodium chloride flush (NS) 0.9 % injection 1 mL   Medications administered: We administered iohexol, lidocaine, dexamethasone, ropivacaine (PF) 2  mg/mL (0.2%), and sodium chloride flush.  See the medical record for exact dosing, route, and time of administration.  Follow-up plan:   Return in  about 4 weeks (around 04/02/2019) for Post Procedure Evaluation.      History of L4-5 laminectomy with Dr. Lacinda Axon due to synovial cyst now having left L4-L5 radicular pain status post L5-S1 ESI on the left on 03/05/2019   Recent Visits Date Type Provider Dept  02/25/19 Office Visit Gillis Santa, MD [facility]  Showing recent visits within past 90 days and meeting all other requirements   Today's Visits Date Type Provider Dept  03/05/19 Procedure visit Gillis Santa, MD [facility]  Showing today's visits and meeting all other requirements   Future Appointments Date Type Provider Dept  04/02/19 Appointment Gillis Santa, MD [facility]  Showing future appointments within next 90 days and meeting all other requirements   Disposition: Discharge home  Discharge Date & Time: 03/05/2019; 1200 hrs.   Primary Care Physician: Theotis Burrow, MD Location: [facility] Note by: Gillis Santa, MD Date: 03/05/2019; Time: 2:33 PM  Disclaimer:  Medicine is not an exact science. The only guarantee in medicine is that nothing is guaranteed. It is important to note that the decision to proceed with this intervention was based on the information collected from the patient. The Data and conclusions were drawn from the patient's questionnaire, the interview, and the physical examination. Because the information was provided in large part by the patient, it cannot be guaranteed that it has not been purposely or unconsciously manipulated. Every effort has been made to obtain as much relevant data as possible for this evaluation. It is important to note that the conclusions that lead to this procedure are derived in large part from the available data. Always take into account that the treatment will  also be dependent on availability of resources and existing treatment guidelines, considered by other Pain Management Practitioners as being common knowledge and practice, at the time of the intervention. For Medico-Legal purposes, it is also important to point out that variation in procedural techniques and pharmacological choices are the acceptable norm. The indications, contraindications, technique, and results of the above procedure should only be interpreted and judged by a Board-Certified Interventional Pain Specialist with extensive familiarity and expertise in the same exact procedure and technique.

## 2019-03-06 ENCOUNTER — Telehealth: Payer: Self-pay

## 2019-03-06 NOTE — Telephone Encounter (Signed)
Denies any needs at this time. Patient states she feels well. Instructed to call if needed.

## 2019-04-01 ENCOUNTER — Encounter: Payer: Self-pay | Admitting: Student in an Organized Health Care Education/Training Program

## 2019-04-02 ENCOUNTER — Other Ambulatory Visit: Payer: Self-pay

## 2019-04-02 ENCOUNTER — Encounter: Payer: Self-pay | Admitting: Student in an Organized Health Care Education/Training Program

## 2019-04-02 ENCOUNTER — Ambulatory Visit
Payer: Managed Care, Other (non HMO) | Attending: Student in an Organized Health Care Education/Training Program | Admitting: Student in an Organized Health Care Education/Training Program

## 2019-04-02 DIAGNOSIS — G894 Chronic pain syndrome: Secondary | ICD-10-CM | POA: Diagnosis not present

## 2019-04-02 DIAGNOSIS — Z9889 Other specified postprocedural states: Secondary | ICD-10-CM | POA: Diagnosis not present

## 2019-04-02 DIAGNOSIS — G8929 Other chronic pain: Secondary | ICD-10-CM

## 2019-04-02 DIAGNOSIS — M5416 Radiculopathy, lumbar region: Secondary | ICD-10-CM

## 2019-04-02 DIAGNOSIS — M47816 Spondylosis without myelopathy or radiculopathy, lumbar region: Secondary | ICD-10-CM | POA: Diagnosis not present

## 2019-04-02 NOTE — Progress Notes (Signed)
Pain Management Virtual Encounter Note - Virtual Visit via [place] (real-time audio visits between healthcare provider and patient).   Patient's Phone No. & Preferred Pharmacy:  [phone] (home); There is no such number on file (mobile).; (Preferred) [phone] [email or web address]  [id number] [address], [place] [address] Phone: [phone] Fax: [phone]    Pre-screening note:  Our staff contacted Sherry Hobbs and offered her an "in person", "face-to-face" appointment versus a telephone encounter. She indicated preferring the telephone encounter, at this time.   Reason for Virtual Visit: COVID-19*  Social distancing based on CDC and AMA recommendations.   I contacted Sherry Hobbs on 04/02/2019 via video conference.      I clearly identified myself as Gillis Santa, MD. I verified that I was speaking with the correct person using two identifiers (Name: Sherry Hobbs, and date of birth: 02/13/1971).  Advanced Informed Consent I sought verbal advanced consent from Sherry Hobbs for virtual visit interactions. I informed Sherry Hobbs of possible security and privacy concerns, risks, and limitations associated with providing "not-in-person" medical evaluation and management services. I also informed Sherry Hobbs of the availability of "in-person" appointments. Finally, I informed her that there would be a charge for the virtual visit and that she could be  personally, fully or partially, financially responsible for it. Sherry Hobbs expressed understanding and agreed to proceed.   Historic Elements   Sherry Hobbs is a 48 y.o. year old, female patient evaluated today after her last encounter by our practice on 03/06/2019. Sherry Hobbs  has a past medical history of Anemia, Arthritis, Depression, Diabetes mellitus without complication ([place]), Elevated serum creatinine, GERD (gastroesophageal reflux disease), Headache,  History of blood transfusion (05/02/2016), and Hypertension. She also  has a past surgical history that includes Dilation and curettage of uterus (05/03/2016); Dilatation & currettage/hysteroscopy with novasure ablation (N/A, 12/03/2017); and Lumbar laminectomy/decompression microdiscectomy (Left, 06/24/2018). Sherry Hobbs has a current medication list which includes the following prescription(s): calcium carbonate, ibuprofen, lisinopril, metformin, methocarbamol, and pregabalin. She  reports that she has never smoked. She has never used smokeless tobacco. She reports that she does not drink alcohol or use drugs. Sherry Hobbs is allergic to codeine.   HPI  Today, she is being contacted for a post-procedure assessment.  Evaluation of last interventional procedure  03/05/2019 Procedure:   Type: Diagnostic Inter-Laminar Epidural Steroid Injection  #1  Region: Lumbar Level: L5-S1 Level. Laterality: Left-Sided         Type: Local Anesthesia  Local Anesthetic: Lidocaine 1-2%  Position: Prone with head of the table was raised to facilitate breathing.   Indications: 1. Chronic radicular lumbar pain (left)    History of L4-L5 laminectomy for synovial cyst now having left leg pain related to left L4-L5 radiculopathy.  Avoid L4-L5 due to laminectomy.   Sedation: Please see nurses note for DOS. When no sedatives are used, the analgesic levels obtained are directly associated to the effectiveness of the local anesthetics. However, when sedation is provided, the level of analgesia obtained during the initial 1 hour following the intervention, is believed to be the result of a combination of factors. These factors may include, but are not limited to: 1. The effectiveness of the local anesthetics used. 2. The effects of the analgesic(s) and/or anxiolytic(s) used. 3. The degree of discomfort experienced by the patient at the time of the procedure. 4. The patients ability and reliability in recalling and  recording the events. 5.  The presence and influence of possible secondary gains and/or psychosocial factors. Reported result: Relief experienced during the 1st hour after the procedure: 100%   (Ultra-Short Term Relief)            Interpretative annotation: Clinically appropriate result. Analgesia during this period is likely to be Local Anesthetic and/or IV Sedative (Analgesic/Anxiolytic) related.          Effects of local anesthetic: The analgesic effects attained during this period are directly associated to the localized infiltration of local anesthetics and therefore cary significant diagnostic value as to the etiological location, or anatomical origin, of the pain. Expected duration of relief is directly dependent on the pharmacodynamics of the local anesthetic used. Long-acting (4-6 hours) anesthetics used.  Reported result: Relief during the next 4 to 6 hour after the procedure: 100%   (Short-Term Relief)            Interpretative annotation: Clinically appropriate result. Analgesia during this period is likely to be Local Anesthetic-related.          Long-term benefit: Defined as the period of time past the expected duration of local anesthetics (1 hour for short-acting and 4-6 hours for long-acting). With the possible exception of prolonged sympathetic blockade from the local anesthetics, benefits during this period are typically attributed to, or associated with, other factors such as analgesic sensory neuropraxia, antiinflammatory effects, or beneficial biochemical changes provided by agents other than the local anesthetics.  Reported result: Extended relief following procedure: 90%   (Long-Term Relief)            Interpretative annotation: Clinically appropriate result. Good relief. Therapeutic success. Inflammation plays a part in the etiology to the pain.          Laboratory Chemistry Profile (12 mo)  Renal: 06/07/2018: BUN 14; Creatinine, Ser 1.09  Lab Results  Component Value Date    GFRAA >60 06/07/2018   GFRNONAA >60 06/07/2018   Hepatic: No results found for requested labs within last 8760 hours. Lab Results  Component Value Date   AST 20 05/02/2016   ALT 16 05/02/2016   Other: No results found for requested labs within last 8760 hours. Note: Above Lab results reviewed.   Assessment  The primary encounter diagnosis was Chronic radicular lumbar pain (left). Diagnoses of History of lumbar surgery (L4-L5 laminectomy for cyst removal Dr Lacinda Axon 06/2018), Lumbar radiculopathy (left), Lumbar facet arthropathy, and Chronic pain syndrome were also pertinent to this visit.  Plan of Care  I am having Sherry Hobbs maintain her lisinopril, metFORMIN, calcium carbonate, ibuprofen, methocarbamol, and pregabalin.  Significant benefit in regards to her pain and functional status after previous left L5-S1 epidural steroid injection performed on 03/05/2019.  Patient rates ongoing pain relief as 90% and states that she is doing much better than before.  We will place as needed order for repeat lumbar ESI should patient's pain return to the point that she would like to repeat lumbar epidural steroid injection.  Patient instructed to call our clinic if she would like to repeat injection in future.  Orders:  Orders Placed This Encounter  Procedures  . LESI (PRN)    Standing Status:   Standing    Number of Occurrences:   9    Standing Expiration Date:   09/29/2020    Scheduling Instructions:     Purpose: Palliative     Indication: Lower extremity pain/Sciatica unspecified side (M54.30).     Side: LEFT     Level: L5/S1  Sedation: Patient's choice.     TIMEFRAME: PRN procedure. (Sherry Hobbs will call when needed.)    Order Specific Question:   Where will this procedure be performed?    Answer:   [facility]   Follow-up plan:   Return if symptoms worsen or fail to improve.     History of L4-5 laminectomy with Dr. Lacinda Axon due to synovial cyst now having left L4-L5 radicular  pain status post L5-S1 ESI on the left on 03/05/2019    Recent Visits Date Type Provider Dept  03/05/19 Procedure visit Gillis Santa, MD [facility]  02/25/19 Office Visit Gillis Santa, MD [facility]  Showing recent visits within past 90 days and meeting all other requirements   Today's Visits Date Type Provider Dept  04/02/19 Office Visit Gillis Santa, MD [facility]  Showing today's visits and meeting all other requirements   Future Appointments No visits were found meeting these conditions.  Showing future appointments within next 90 days and meeting all other requirements   I discussed the assessment and treatment plan with the patient. The patient was provided an opportunity to ask questions and all were answered. The patient agreed with the plan and demonstrated an understanding of the instructions.  Patient advised to call back or seek an in-person evaluation if the symptoms or condition worsens.  Total duration of non-face-to-face encounter:53minutes.  Note by: Gillis Santa, MD Date: 04/02/2019; Time: 2:58 PM  Note: This dictation was prepared with Dragon dictation. Any transcriptional errors that may result from this process are unintentional.  Disclaimer:  * Given the special circumstances of the COVID-19 pandemic, the federal government has announced that the Office for Civil Rights (OCR) will exercise its enforcement discretion and will not impose penalties on physicians using telehealth in the event of noncompliance with regulatory requirements under the [place] (HIPAA) in connection with the good faith provision of telehealth during the XX123456 national public health emergency. ([place])

## 2019-04-09 ENCOUNTER — Telehealth: Payer: Self-pay | Admitting: Obstetrics and Gynecology

## 2019-04-09 NOTE — Telephone Encounter (Signed)
Pt called in she is having some issues with a procedure done over a year ago. Pt just has some questions. Pt is requesting a call back. Please advise

## 2019-04-14 NOTE — Telephone Encounter (Signed)
Spoke with pt and she stated that noticing changes in her cycles. Pt stated her cycles are increasing and starting be longer and heavier each month. Pt is concerned that her cycles will be back the way they were before the ablation.

## 2019-05-01 ENCOUNTER — Encounter: Payer: Self-pay | Admitting: Obstetrics and Gynecology

## 2019-05-01 ENCOUNTER — Ambulatory Visit (INDEPENDENT_AMBULATORY_CARE_PROVIDER_SITE_OTHER): Payer: Managed Care, Other (non HMO) | Admitting: Obstetrics and Gynecology

## 2019-05-01 ENCOUNTER — Other Ambulatory Visit: Payer: Self-pay

## 2019-05-01 VITALS — BP 144/81 | HR 81 | Ht 62.0 in | Wt 194.6 lb

## 2019-05-01 DIAGNOSIS — Z9889 Other specified postprocedural states: Secondary | ICD-10-CM

## 2019-05-01 DIAGNOSIS — N939 Abnormal uterine and vaginal bleeding, unspecified: Secondary | ICD-10-CM | POA: Diagnosis not present

## 2019-05-01 NOTE — Progress Notes (Signed)
GYNECOLOGY PROGRESS NOTE  Subjective:    Patient ID: Sherry Hobbs, female    DOB: August 12, 1970, 48 y.o.   MRN: [id number]  HPI  Patient is a 48 y.o. G20P1001 female who presents for complaints of abnormal uterine bleeding for the past 6 months. Has a prior history of endometrial ablation in 11/2017 for abnormal uterine bleeding and h/o anemia requiring blood transfusion.  Notes that initially the periods were light lasting 1-2 days, but now lasting up to 1 week, heavier with passage of blood clots. Using approximately 1-2 pads per hour with heavy flow, but some days may only change 1 pad the entire day. Also notes bleeding with bowel movements, but unsure if blood is vaginal or from the rectum.   The following portions of the patient's history were reviewed and updated as appropriate: allergies, current medications, past family history, past medical history, past social history, past surgical history and problem list.  Review of Systems Pertinent items noted in HPI and remainder of comprehensive ROS otherwise negative.   Objective:   Blood pressure (!) 144/81, pulse 81, height 5\' 2"  (1.575 m), weight 194 lb 9.6 oz (88.3 kg). General appearance: alert and no distress Abdomen: soft, mildly tender in the right lower quadrant. No organomegaly, no disention Pelvic: external genitalia normal, rectovaginal septum normal.  Vagina with scant red blood in vaginal vault. Cervix normal appearing, no lesions and no motion tenderness.  Uterus mobile, nontender, normal shape and size.  Adnexae non-palpable, nontender bilaterally.  Extremities: extremities normal, atraumatic, no cyanosis or edema Neurologic: Grossly normal    Imaging:    Assessment:   Abnormal uterine bleeding (AUB) History of endometrial ablation  Plan:   - Discussed management options again for abnormal uterine bleeding.  She is s/p endometrial ablation in 2019.  Discussed use of (Naproxen), tranexamic acid (Lysteda), oral  progesterone, Depo Provera, Levonogestrel IUD, repeat endometrial ablation or hysterectomy as definitive surgical management.  Discussed risks and benefits of each method.   Patient is unsure of her desires at this time. Has tried Aygestin in the past which did not work for her.  Emphasized IUD, repeat ablation, or hysterectomy as best options. Given information on each.  To repeat pelvic ultrasound. Discussed if surgical intervention desired would require a repeat endometrial biopsy. RTC in 2-3 weeks for ultrasound and further discussion of options. Given a sample of Slynd to help with bleeding until she returns. Can start today.    ULTRASOUND REPORT  Location: [facility] Date of Service:  11/13/2017   Indications: DUB Findings:  The uterus measures 11.9 x 7.0 x 6.5 cm. Echo texture is homogeneous without evidence of focal masses. The Endometrium measures 13.0 mm.  Right Ovary measures 3.5 x 2.3 x 2.1 cm and contains 2 dominant follicles measuring 2.3 x 1.5 x 1.6 cm and 1.7 x 1.0 x 1.1 cm. It is normal in appearance. Left Ovary measures 2.9 x 2.4 x 1.3 cm (seen transabdominally only). It is normal appearance. Survey of the adnexa demonstrates no adnexal masses. There is no free fluid in the cul de sac.  Impression: 1. Anteverted uterus appears enlarged at 11.9 x 7.0 x 6.5 cm. 2. The endometrium appears thickened at 13.0 mm. 3. Bilateral ovaries appear WNL.  Right ovary contains 2 dominant follicles.  Recommendations: 1.Clinical correlation with the patient's History and Physical Exam.   Dario Ave, RDMS   I have reviewed this study and agree with documented findings.    [name], MD [facility]   [name], MD [facility]

## 2019-05-01 NOTE — Progress Notes (Signed)
Pt is present for heavy bleeding. Pt stated that she is having heavy bleeding again like she did before ablation.

## 2019-05-01 NOTE — Patient Instructions (Signed)
Abnormal Uterine Bleeding Abnormal uterine bleeding is unusual bleeding from the uterus. It includes:  Bleeding or spotting between periods.  Bleeding after sex.  Bleeding that is heavier than normal.  Periods that last longer than usual.  Bleeding after menopause. Abnormal uterine bleeding can affect women at various stages in life, including teenagers, women in their reproductive years, pregnant women, and women who have reached menopause. Common causes of abnormal uterine bleeding include:  Pregnancy.  Growths of tissue (polyps).  A noncancerous tumor in the uterus (fibroid).  Infection.  Cancer.  Hormonal imbalances. Any type of abnormal bleeding should be evaluated by a health care provider. Many cases are minor and simple to treat, while others are more serious. Treatment will depend on the cause of the bleeding. Follow these instructions at home:  Monitor your condition for any changes.  Do not use tampons, douche, or have sex if told by your health care provider.  Change your pads often.  Get regular exams that include pelvic exams and cervical cancer screening.  Keep all follow-up visits as told by your health care provider. This is important. Contact a health care provider if:  Your bleeding lasts for more than one week.  You feel dizzy at times.  You feel nauseous or you vomit. Get help right away if:  You pass out.  Your bleeding soaks through a pad every hour.  You have abdominal pain.  You have a fever.  You become sweaty or weak.  You pass large blood clots from your vagina. Summary  Abnormal uterine bleeding is unusual bleeding from the uterus.  Any type of abnormal bleeding should be evaluated by a health care provider. Many cases are minor and simple to treat, while others are more serious.  Treatment will depend on the cause of the bleeding. This information is not intended to replace advice given to you by your health care provider.  Make sure you discuss any questions you have with your health care provider. Document Released: 05/01/2005 Document Revised: 08/08/2017 Document Reviewed: 06/02/2016 Elsevier Patient Education  2020 [employer]  

## 2019-05-03 IMAGING — RF DG C-ARM 61-120 MIN
1 series · 3 of 3 positions shown · non-contrast
Comparison: None.

CLINICAL DATA: Lumbar hemilaminectomy, foraminotomy, cyst removal,
L4-5.

EXAM:
LUMBAR SPINE - 2-3 VIEW; DG C-ARM 61-120 MIN

[Series 1: unknown protocol · 0.14mm/px · 3 of 3 slices shown]
[im 1/3]
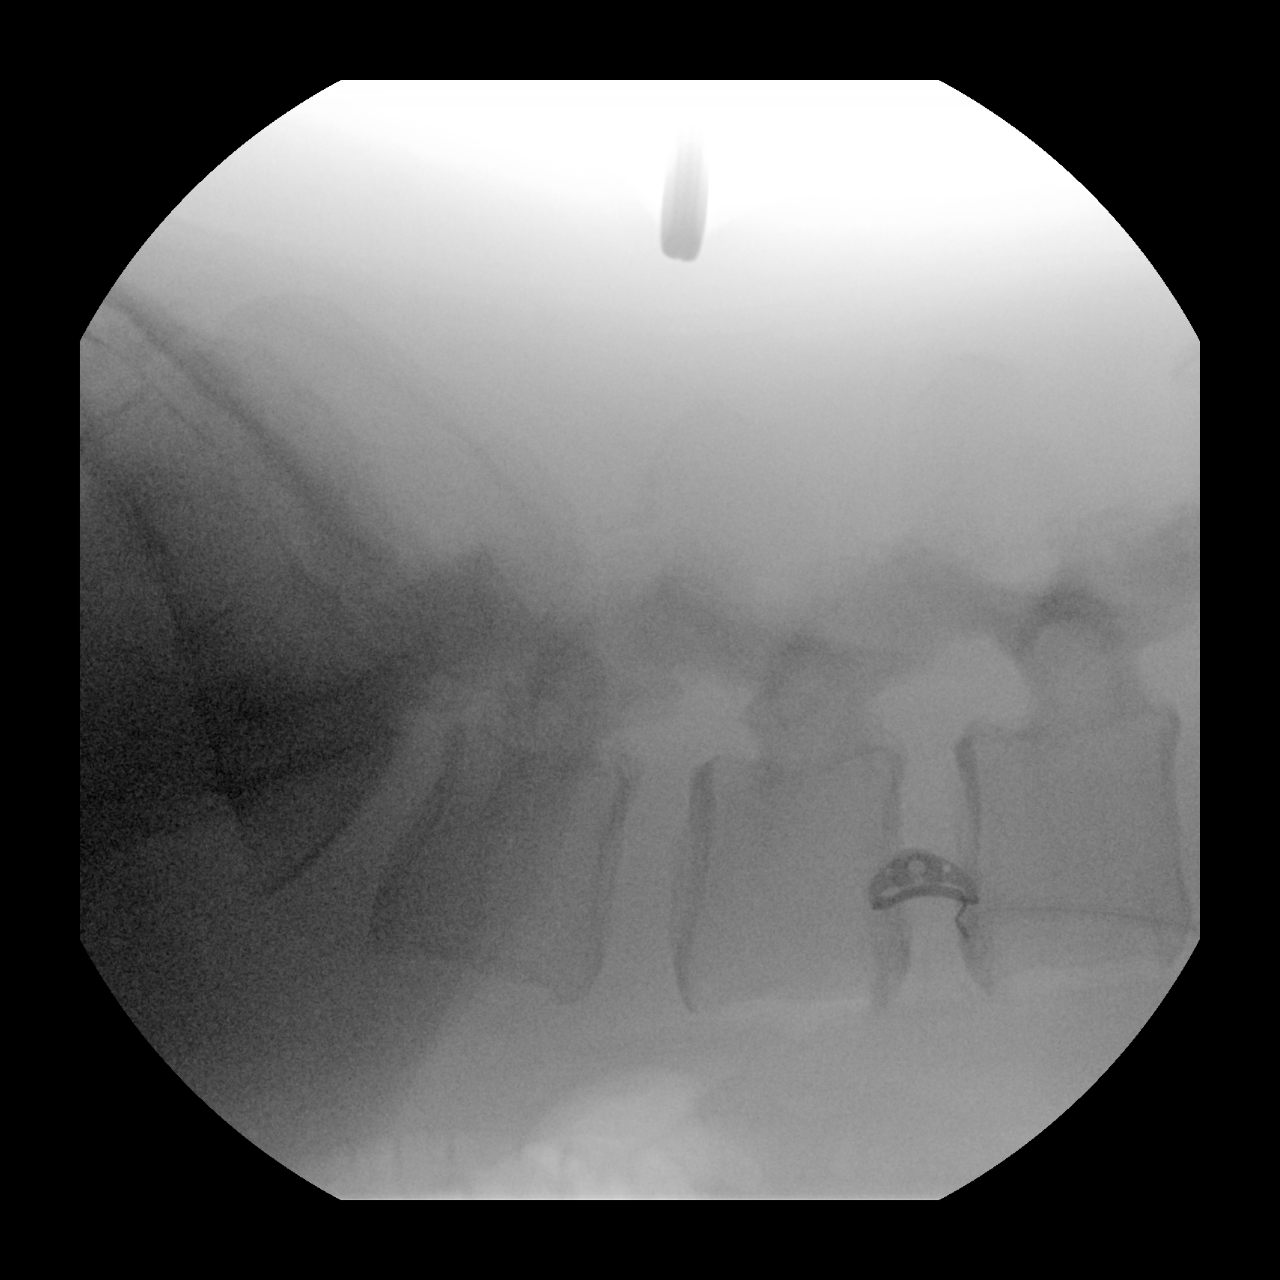
[im 2/3]
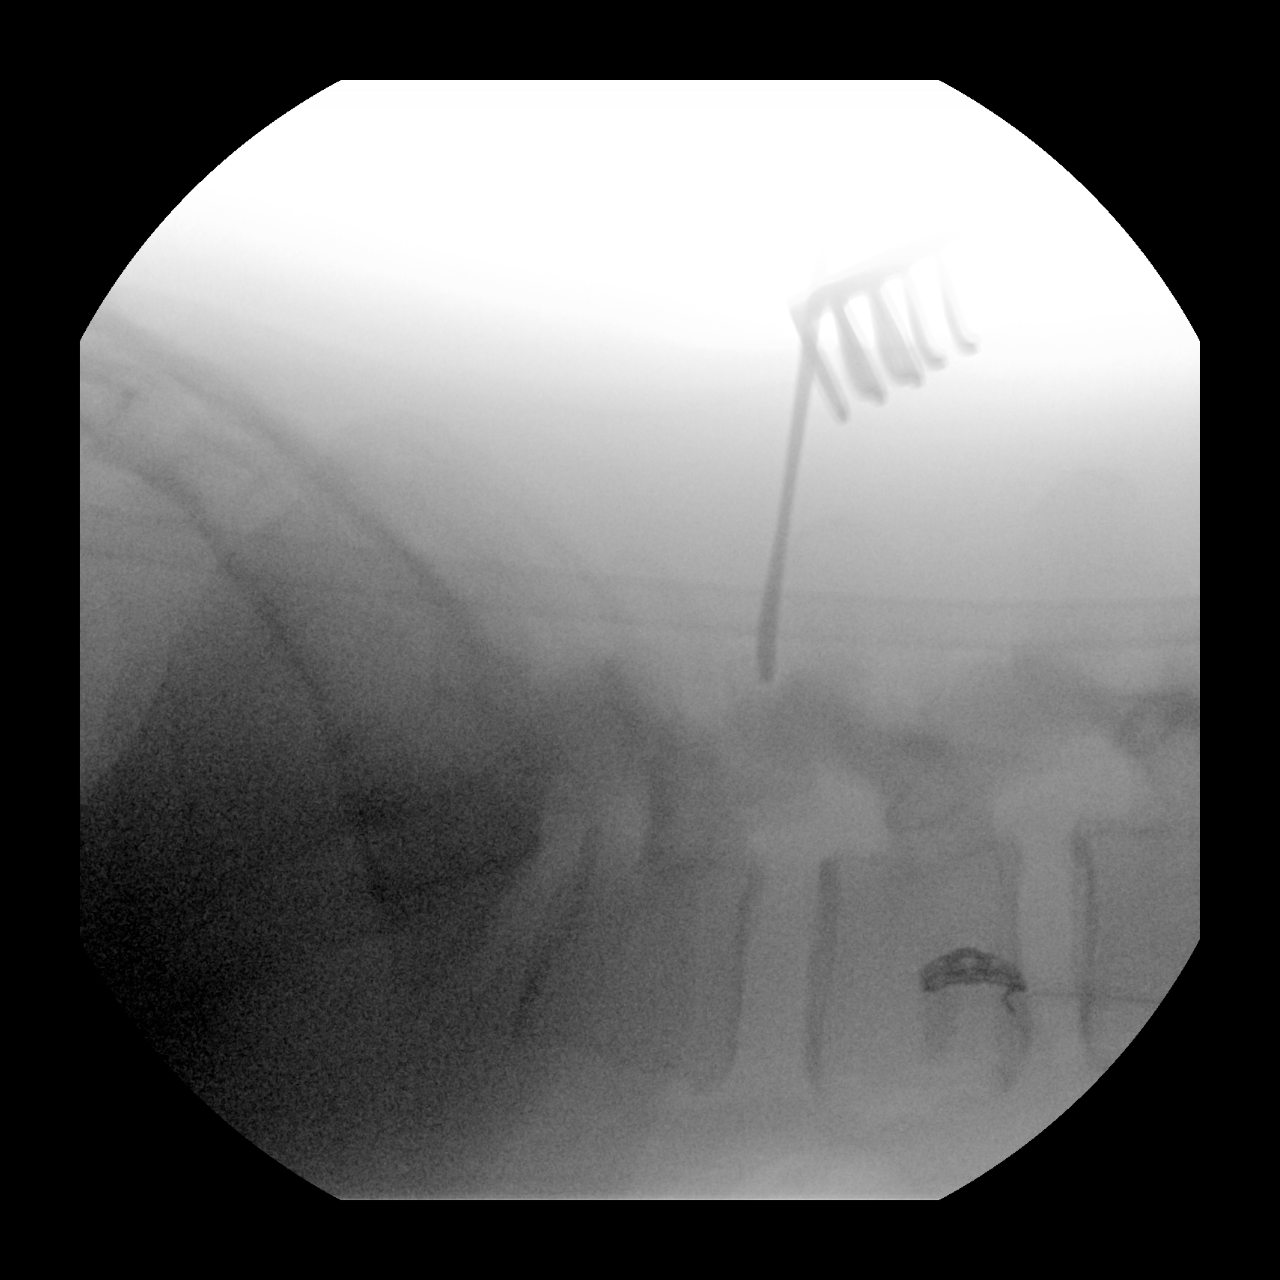
[im 3/3]
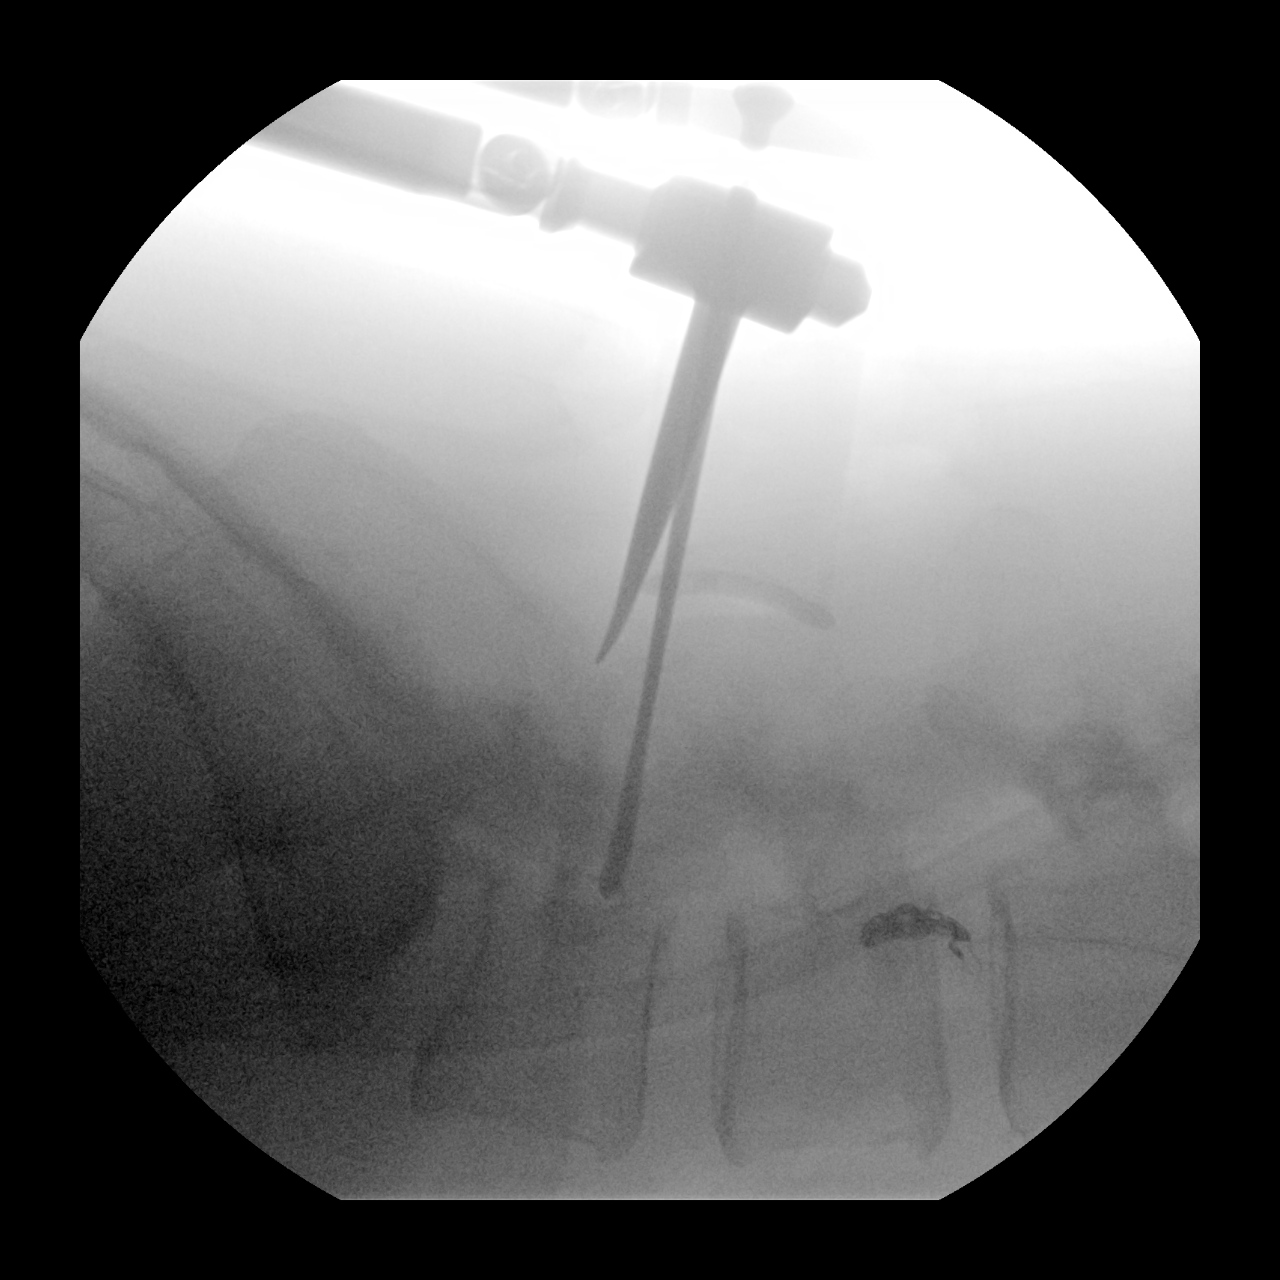

[3 of 3 positions shown; findings below may reference images not displayed]

FINDINGS: Final image demonstrates surgical instrument with tip over the
spinal canal at the L5 level. Body heights and disc spaces are
within normal. There is subtle spondylosis of the lumbar spine.
IMPRESSION: Surgical instrument with tip over the spinal canal at the L5 level.

## 2019-05-22 ENCOUNTER — Other Ambulatory Visit: Payer: Self-pay

## 2019-05-22 ENCOUNTER — Other Ambulatory Visit: Payer: Self-pay | Admitting: Obstetrics and Gynecology

## 2019-05-22 ENCOUNTER — Ambulatory Visit (INDEPENDENT_AMBULATORY_CARE_PROVIDER_SITE_OTHER): Payer: Managed Care, Other (non HMO)

## 2019-05-22 ENCOUNTER — Encounter: Payer: Self-pay | Admitting: Obstetrics and Gynecology

## 2019-05-22 ENCOUNTER — Ambulatory Visit (INDEPENDENT_AMBULATORY_CARE_PROVIDER_SITE_OTHER): Payer: Managed Care, Other (non HMO) | Admitting: Obstetrics and Gynecology

## 2019-05-22 VITALS — BP 122/77 | HR 105 | Ht 62.0 in | Wt 190.5 lb

## 2019-05-22 DIAGNOSIS — Z9889 Other specified postprocedural states: Secondary | ICD-10-CM

## 2019-05-22 DIAGNOSIS — N8301 Follicular cyst of right ovary: Secondary | ICD-10-CM | POA: Diagnosis not present

## 2019-05-22 DIAGNOSIS — N8302 Follicular cyst of left ovary: Secondary | ICD-10-CM | POA: Diagnosis not present

## 2019-05-22 DIAGNOSIS — N924 Excessive bleeding in the premenopausal period: Secondary | ICD-10-CM

## 2019-05-22 DIAGNOSIS — N939 Abnormal uterine and vaginal bleeding, unspecified: Secondary | ICD-10-CM

## 2019-05-22 NOTE — Progress Notes (Signed)
GYNECOLOGY PROGRESS NOTE  Subjective:    Patient ID: Sherry Hobbs, female    DOB: 04/12/1971, 49 y.o.   MRN: [id number]  HPI  Patient is a 49 y.o. G42P1001 female who presents for f/u after ultrasound for abnormal perimenopausal bleeding with prior history of endometrial ablation.  She was also placed on Slynd last visit to help control her bleeding. She notes that the bleeding has decreased, however has not completely stopped.   The following portions of the patient's history were reviewed and updated as appropriate: allergies, current medications, past family history, past medical history, past social history, past surgical history and problem list.  Review of Systems Pertinent items noted in HPI and remainder of comprehensive ROS otherwise negative.   Objective:   Blood pressure 122/77, pulse (!) 105, height 5\' 2"  (1.575 m), weight 190 lb 8 oz (86.4 kg). General appearance: alert and no distress Abdomen: soft, non-tender; bowel sounds normal; no masses,  no organomegaly Pelvic: external genitalia normal, rectovaginal septum normal.  Vagina without discharge.  Cervix normal appearing, no lesions and no motion tenderness.  Uterus mobile, nontender, normal shape and size.  Adnexae non-palpable, nontender bilaterally.    Imaging:  Patient Name: Sherry Hobbs DOB: [birth year] MRN: [id number] ULTRASOUND REPORT  Location: [facility]  Date of Service: 05/22/2019     Indications:AUB Findings:  The uterus is anteverted and measures 9.0 x 5.3 x 5.4 cm. Echo texture is homogenous without evidence of focal masses.  The Endometrium measures 10 mm.  Right Ovary measures 3.0 x 1.6 x 2.3 cm. It is normal in appearance. 2 dominate follicles seen approximately  1 cm each. Left Ovary measures 3.5 x 2.3 x 3.1 cm. It is normal in appearance. Dominate follicle approximately 1.5 cm. Survey of the adnexa demonstrates no adnexal masses. There is no free fluid in the cul de  sac.  Impression: 1. Bilateral dominate follicles.  Recommendations: 1.Clinical correlation with the patient's History and Physical Exam.   Jenine M. Albertine Grates    RDMS  Assessment:   Abnormal perimenopausal bleeding History of endometrial ablation  Plan:   - Reviewed ultrasound results with patient, overall normal, with a thickening of the endometrium despite prior ablation. - Discussed options for management again, based on ultrasound findings including use of (Naproxen), tranexamic acid (Lysteda), oral progesterone, Depo Provera, Levonogestrel IUD, repeat endometrial ablation or hysterectomy as definitive surgical management.  Discussed risks and benefits of each method. Is previously s/p prior endometrial ablation, and has tried Aygestin in the past with no relief.  Patient leaning towards definitive management with hysterectomy.   Discussed if surgical intervention desired would require a repeat endometrial biopsy. Ok to perform today (see below). RTC in 4 weeks for discussion of surgery and pre-op exam. Given another sample of Slynd to help with bleeding until she returns.     Endometrial Biopsy Procedure Note  The patient is positioned on the exam table in the dorsal lithotomy position. Bimanual exam confirms uterine position and size. A Graves speculum is placed into the vagina. A single toothed tenaculum is placed onto the anterior lip of the cervix. The pipette is placed into the endocervical canal and is advanced to the uterine fundus. Using a piston like technique, with vacuum created by withdrawing the stylus, the endometrial specimen is obtained and transferred to the biopsy container. Minimal bleeding is encountered. The procedure is well tolerated.   Uterine Position:mid anterior posterior   Uterine Length: 8 cm   Uterine Specimen: Chalmers Cater  Post procedure instructions are given. The patient is scheduled for follow up appointment.   [name], MD [facility]

## 2019-05-22 NOTE — Progress Notes (Signed)
Pt follow up after u/s. Pt stated that she was doing well no problems.

## 2019-05-22 NOTE — Patient Instructions (Signed)
Hysterectomy Information  A hysterectomy is a surgery in which the uterus is removed. The fallopian tubes and ovaries may be removed (bilateral salpingo-oophorectomy) as well. This procedure may be done to treat various medical problems. After the procedure, a woman will no longer have menstrual periods nor will she be able to become pregnant (sterile). What are the reasons for a hysterectomy? There are many reasons why a woman might have this procedure. They include:  Persistent, abnormal vaginal bleeding.  Long-term (chronic) pelvic pain or infection.  Endometriosis. This is when the lining of the uterus (endometrium) starts to grow outside the uterus.  Adenomyosis. This is when the endometrium starts to grow in the muscle of the uterus.  Pelvic organ prolapse. This is a condition in which the uterus falls down into the vagina.  Noncancerous growths in the uterus (uterine fibroids) that cause symptoms.  The presence of precancerous cells.  Cervical or uterine cancer. What are the different types of hysterectomy? There are three different types of hysterectomy:  Supracervical hysterectomy. In this type, the top part of the uterus is removed, but not the cervix.  Total hysterectomy. In this type, the uterus and cervix are removed.  Radical hysterectomy. In this type, the uterus, the cervix, and the tissue that holds the uterus in place (parametrium) are removed. What are the different ways a hysterectomy can be performed? There are many different ways a hysterectomy can be performed, including:  Abdominal hysterectomy. In this type, an incision is made in the abdomen. The uterus is removed through this incision.  Vaginal hysterectomy. In this type, an incision is made in the vagina. The uterus is removed through this incision. There are no abdominal incisions.  Conventional laparoscopic hysterectomy. In this type, three or four small incisions are made in the abdomen. A thin,  lighted tube with a camera (laparoscope) is inserted into one of the incisions. Other tools are put through the other incisions. The uterus is cut into small pieces. The small pieces are removed through the incisions or through the vagina.  Laparoscopically assisted vaginal hysterectomy (LAVH). In this type, three or four small incisions are made in the abdomen. Part of the surgery is performed laparoscopically and the other part is done vaginally. The uterus is removed through the vagina.  Robot-assisted laparoscopic hysterectomy. In this type, a laparoscope and other tools are inserted into three or four small incisions in the abdomen. A computer-controlled device is used to give the surgeon a 3D image and to help control the surgical instruments. This allows for more precise movements of surgical instruments. The uterus is cut into small pieces and removed through the incisions or removed through the vagina. Discuss the options with your health care provider to determine which type is the right one for you. What are the risks? Generally, this is a safe procedure. However, problems may occur, including:  Bleeding and risk of blood transfusion. Tell your health care provider if you do not want to receive any blood products.  Blood clots in the legs or lung.  Infection.  Damage to other structures or organs.  Allergic reactions to medicines.  Changing to an abdominal hysterectomy from one of the other techniques. What to expect after a hysterectomy  You will be given pain medicine.  You may need to stay in the hospital for 1- 2 days to recover, depending on the type of hysterectomy you had.  Follow your health care provider's instructions about exercise, driving, and general activities. Ask your   health care provider what activities are safe for you.  You will need to have someone with you for the first 3-5 days after you go home.  You will need to follow up with your surgeon in 2-4  weeks after surgery to evaluate your progress.  If the ovaries are removed, you will have early menopause symptoms such as hot flashes, night sweats, and insomnia.  If you had a hysterectomy for a problem that was not cancer or not a condition that could lead to cancer, then you no longer need Pap tests. However, even if you no longer need a Pap test, a regular pelvic exam is a good idea to make sure no other problems are developing. Questions to ask your health care provider  Is a hysterectomy medically necessary? Do I have other treatment options for my condition?  What are my options for hysterectomy procedure?  What organs and tissues need to be removed?  What are the risks?  What are the benefits?  How long will I need to stay in the hospital after the procedure?  How long will I need to recover at home?  What symptoms can I expect after the procedure? Summary  A hysterectomy is a surgery in which the uterus is removed. The fallopian tubes and ovaries may be removed (bilateral salpingo-oophorectomy) as well.  This procedure may be done to treat various medical problems. After the procedure, a woman will no longer have menstrual periods nor will she be able to become pregnant.  Discuss the options with your health care provider to determine which type of hysterectomy is the right one for you. This information is not intended to replace advice given to you by your health care provider. Make sure you discuss any questions you have with your health care provider. Document Revised: 04/13/2017 Document Reviewed: 06/07/2016 Elsevier Patient Education  2020 [employer]  

## 2019-05-23 ENCOUNTER — Other Ambulatory Visit (HOSPITAL_COMMUNITY)
Admission: RE | Admit: 2019-05-23 | Discharge: 2019-05-23 | Disposition: A | Discharge status: Home / Self Care | Payer: Managed Care, Other (non HMO) | Source: Ambulatory Visit | Attending: Obstetrics and Gynecology | Admitting: Obstetrics and Gynecology

## 2019-05-23 ENCOUNTER — Encounter: Payer: Self-pay | Admitting: Obstetrics and Gynecology

## 2019-05-23 DIAGNOSIS — N939 Abnormal uterine and vaginal bleeding, unspecified: Secondary | ICD-10-CM | POA: Diagnosis present

## 2019-05-26 LAB — SURGICAL PATHOLOGY

## 2019-06-01 ENCOUNTER — Other Ambulatory Visit: Payer: Self-pay | Admitting: Student in an Organized Health Care Education/Training Program

## 2019-06-13 ENCOUNTER — Other Ambulatory Visit: Payer: Self-pay

## 2019-06-13 MED ORDER — IBUPROFEN 800 MG PO TABS: 800.0000 mg | ORAL_TABLET | Freq: Three times a day (TID) | ORAL | 0 refills | Status: DC

## 2019-06-24 ENCOUNTER — Encounter: Payer: Managed Care, Other (non HMO) | Admitting: Obstetrics and Gynecology

## 2019-06-24 ENCOUNTER — Encounter: Payer: Self-pay | Admitting: Obstetrics and Gynecology

## 2019-06-24 ENCOUNTER — Ambulatory Visit (INDEPENDENT_AMBULATORY_CARE_PROVIDER_SITE_OTHER): Payer: Managed Care, Other (non HMO) | Admitting: Obstetrics and Gynecology

## 2019-06-24 ENCOUNTER — Other Ambulatory Visit: Payer: Self-pay

## 2019-06-24 ENCOUNTER — Encounter: Payer: Self-pay | Admitting: Student in an Organized Health Care Education/Training Program

## 2019-06-24 ENCOUNTER — Telehealth: Payer: Self-pay | Admitting: Student in an Organized Health Care Education/Training Program

## 2019-06-24 VITALS — BP 112/69 | HR 111 | Ht 62.0 in | Wt 194.9 lb

## 2019-06-24 DIAGNOSIS — N924 Excessive bleeding in the premenopausal period: Secondary | ICD-10-CM

## 2019-06-24 DIAGNOSIS — I10 Essential (primary) hypertension: Secondary | ICD-10-CM

## 2019-06-24 DIAGNOSIS — Z9889 Other specified postprocedural states: Secondary | ICD-10-CM

## 2019-06-24 NOTE — H&P (Signed)
GYNECOLOGY PREOPERATIVE HISTORY AND PHYSICAL   Subjective:  Sherry Hobbs is a 49 y.o. G1P1001 here for surgical management of perimenopausal menorrhagia.  She has a prior history of endometrial ablation in 2019. No significant preoperative concerns.  She has had a history of blood transfusion in the past due to anemia from chronic blood loss.  Proposed surgery: Laparoscopic-assisted vaginal hysterectomy (possible TVH) with bilateral salpingectomy   Pertinent Gynecological History: Menses: flow is excessive with use of 1-2 pads or tampons per hour on heaviest days and usually lasting 1 week.  Currently on progesterone supplementation so bleeding has decreased in flow, however now with persistent light spotting to light flow.  Bleeding: dysfunctional uterine bleeding Last mammogram: 2012 Last pap: normal Date: 2017   Past Medical History:  Diagnosis Date  . Anemia    problems when having abnmornal uterine bleeding.  . Arthritis    lower back  . Depression   . Diabetes mellitus without complication ([place])   . Elevated serum creatinine   . GERD (gastroesophageal reflux disease)    OCC  NO MEDS  . Headache    occasional migraines  . History of blood transfusion 05/02/2016   due to abnormal uterine bleeding  . Hypertension    Past Surgical History:  Procedure Laterality Date  . DILATION AND CURETTAGE OF UTERUS  05/03/2016   Procedure: DILATATION AND CURETTAGE;  Surgeon: [name], MD;  Location: [facility] OR[facility];  Service: Gynecology;;  . Murrell Redden & CURRETTAGE/HYSTROSCOPY WITH NOVASURE ABLATION N/A 12/03/2017   Procedure: DILATATION & CURETTAGE/HYSTEROSCOPY WITH MINERVA ABLATION;  Surgeon: [name], MD;  Location: [facility];  Service: Gynecology;  Laterality: N/A;  MINERVA REP CONTACTED [facility] [phone])  . LUMBAR LAMINECTOMY/DECOMPRESSION MICRODISCECTOMY Left 06/24/2018   Procedure: LUMBAR HEMILAMINECTOMY, FORAMINOTOMY, CYST REMOVAL - 1 LEVEL LEFT L4-5;  Surgeon:  [name], MD;  Location: [facility] OR[facility];  Service: Neurosurgery;  Laterality: Left;   OB History  Gravida Para Term Preterm AB Living  1 1 1     1   SAB TAB Ectopic Multiple Live Births          1    # Outcome Date GA Lbr Len/2nd Weight Sex Delivery Anes PTL Lv  1 Term 10/1997 [redacted]w[redacted]d  6 lb 4 oz (2.835 kg) M Vag-Spont   LIV  Patient denies any other pertinent gynecologic issues.  Family History  Problem Relation Age of Onset  . Diabetes Mother   . COPD Mother   . Emphysema Mother   . Osteoporosis Mother   . CAD Father   . Diabetes Sister   . Diabetes Brother   . CAD Brother   . Renal cancer Maternal Uncle        1 uncle  . Brain cancer Maternal Uncle        different uncle  . Diabetes Maternal Grandmother   . Stroke Maternal Grandmother   . Bladder Cancer Maternal Grandfather    Social History   Socioeconomic History  . Marital status: Married    Spouse name: Gwyndolyn Saxon  . Number of children: Not on file  . Years of education: Not on file  . Highest education level: Not on file  Occupational History  . Occupation: Solicitor  Tobacco Use  . Smoking status: Never Smoker  . Smokeless tobacco: Never Used  Substance and Sexual Activity  . Alcohol use: No  . Drug use: No  . Sexual activity: Yes    Birth control/protection: None  Other Topics Concern  .  Not on file  Social History Narrative  . Not on file   Social Determinants of Health   Financial Resource Strain:   . Difficulty of Paying Living Expenses: Not on file  Food Insecurity:   . Worried About Charity fundraiser in the Last Year: Not on file  . Ran Out of Food in the Last Year: Not on file  Transportation Needs:   . Lack of Transportation (Medical): Not on file  . Lack of Transportation (Non-Medical): Not on file  Physical Activity:   . Days of Exercise per Week: Not on file  . Minutes of Exercise per Session: Not on file  Stress:   . Feeling of Stress : Not on file  Social Connections:    . Frequency of Communication with Friends and Family: Not on file  . Frequency of Social Gatherings with Friends and Family: Not on file  . Attends Religious Services: Not on file  . Active Member of Clubs or Organizations: Not on file  . Attends Archivist Meetings: Not on file  . Marital Status: Not on file  Intimate Partner Violence:   . Fear of Current or Ex-Partner: Not on file  . Emotionally Abused: Not on file  . Physically Abused: Not on file  . Sexually Abused: Not on file   Current Outpatient Medications on File Prior to Visit  Medication Sig Dispense Refill  . calcium carbonate (TUMS - DOSED IN MG ELEMENTAL CALCIUM) 500 MG chewable tablet Chew 1 tablet by mouth as needed for indigestion or heartburn.    Marland Kitchen ibuprofen (ADVIL) 600 MG tablet TAKE (1) TABLET EVERY SIX HOURS AS NEEDED. 30 tablet 1  . ibuprofen (ADVIL) 800 MG tablet Take 1 tablet (800 mg total) by mouth 3 (three) times daily. (Patient not taking: Reported on 06/24/2019) 15 tablet 0  . lisinopril (PRINIVIL,ZESTRIL) 10 MG tablet Take 10 mg by mouth every morning.     . metFORMIN (GLUCOPHAGE) 500 MG tablet Take 500-1,000 mg by mouth See admin instructions. Take 500 mg by mouth in the morning and 1000 mg in the evening.  1  . methocarbamol (ROBAXIN) 750 MG tablet Take 1 tablet (750 mg total) by mouth every 8 (eight) hours as needed for muscle spasms. 90 tablet 1  . pregabalin (LYRICA) 75 MG capsule Take 1 capsule (75 mg total) by mouth 2 (two) times daily. 60 capsule 1   No current facility-administered medications on file prior to visit.   Allergies  Allergen Reactions  . Codeine Nausea And Vomiting      Review of Systems Constitutional: No recent fever/chills/sweats Respiratory: No recent cough/bronchitis Cardiovascular: No chest pain Gastrointestinal: No recent nausea/vomiting/diarrhea Genitourinary: No UTI symptoms Hematologic/lymphatic:No history of coagulopathy or recent blood thinner use      Objective:   Blood pressure 112/69, pulse (!) 111, height 5\' 2"  (1.575 m), weight 194 lb 14.4 oz (88.4 kg). CONSTITUTIONAL: Well-developed, well-nourished female in no acute distress.  HENT:  Normocephalic, atraumatic, External right and left ear normal. Oropharynx is clear and moist EYES: Conjunctivae and EOM are normal. Pupils are equal, round, and reactive to light. No scleral icterus.  NECK: Normal range of motion, supple, no masses SKIN: Skin is warm and dry. No rash noted. Not diaphoretic. No erythema. No pallor. NEUROLOGIC: Alert and oriented to person, place, and time. Normal reflexes, muscle tone coordination. No cranial nerve deficit noted. PSYCHIATRIC: Normal mood and affect. Normal behavior. Normal judgment and thought content. CARDIOVASCULAR: Normal heart rate noted, regular  rhythm RESPIRATORY: Effort and breath sounds normal, no problems with respiration noted ABDOMEN: Soft, nontender, nondistended. PELVIC: Deferred MUSCULOSKELETAL: Normal range of motion. No edema and no tenderness. 2+ distal pulses.    Labs: No results found for this or any previous visit (from the past 336 hour(s)).  Pathology (05/23/2019):  A. ENDOMETRIUM, BIOPSY:  - Inactive endometrium with progestational changes.  - No hyperplasia or carcinoma.    Imaging Studies: [place] GYN Pelvis Complete Patient Name: Apryll Halim DOB: Sep 18, 1970 MRN: [id number] ULTRASOUND REPORT  Location: [facility] Date of Service: 05/22/2019   Indications:AUB Findings:  The uterus is anteverted and measures 9.0 x 5.3 x 5.4 cm. Echo texture is homogenous without evidence of focal masses.  The Endometrium measures 10 mm.  Right Ovary measures 3.0 x 1.6 x 2.3 cm. It is normal in appearance. 2  dominate follicles seen approximately  1 cm each. Left Ovary measures 3.5 x 2.3 x 3.1 cm. It is normal in appearance.  Dominate follicle approximately 1.5 cm. Survey of the adnexa demonstrates no adnexal  masses. There is no free fluid in the cul de sac.  Impression: 1. Bilateral dominate follicles.  Recommendations: 1.Clinical correlation with the patient's History and Physical Exam.  Jenine M. Alessi    RDMS  .I have reviewed this study and agree with documented findings.   [name], MD [facility]    Assessment:    1. Perimenopausal menorrhagia   2. S/P endometrial ablation   3. Essential hypertension    Plan:    Counseling: Procedure, risks, reasons, benefits and complications (including injury to bowel, bladder, major blood vessel, ureter, bleeding, possibility of transfusion, infection, or fistula formation) reviewed in detail. Likelihood of success in alleviating the patient's condition was discussed. Routine postoperative instructions will be reviewed with the patient and her family in detail after surgery.  The patient concurred with the proposed plan, giving informed written consent for the surgery.   Preop testing ordered. Instructions reviewed, including NPO after midnight.     [name], MD [facility]

## 2019-06-24 NOTE — Patient Instructions (Signed)
ORAL CONTRACEPTIVE TAPER:  Take 4 pills x 4 days, then decrease to 3 pills x 3 days, then decrease to 2 pills x 2 days, then 1 pill thereafter. Skip placebo pills.    PRE-OPERATIVE SURGERY INSTRUCTIONS You are scheduled for surgery on 07/07/2019 (laparoscopic vs vaginal hysterectomy).  Nothing to eat after midnight on day prior to surgery.  Do not take any medications unless recommended by your provider on day prior to surgery.  Do not take NSAIDs (Motrin, Aleve) or aspirin 7 days prior to surgery.  You may take Tylenol products for minor aches and pains.  You will receive a prescription for pain medications post-operatively.  You will be contacted by phone approximately 1 week prior to surgery to schedule pre-operative appointment.  You will need to have COVID-19 testing prior to your procedure. You will proceed to the [facility] drive-up testing the Friday prior to your scheduled surgery date. You will need to self-quarantine after testing.  Please call the office if you have any questions regarding your upcoming surgery.      Hysterectomy Information  A hysterectomy is a surgery in which the uterus is removed. The fallopian tubes and ovaries may be removed (bilateral salpingo-oophorectomy) as well. This procedure may be done to treat various medical problems. After the procedure, a woman will no longer have menstrual periods nor will she be able to become pregnant (sterile). What are the reasons for a hysterectomy? There are many reasons why a woman might have this procedure. They include:  Persistent, abnormal vaginal bleeding.  Long-term (chronic) pelvic pain or infection.  Endometriosis. This is when the lining of the uterus (endometrium) starts to grow outside the uterus.  Adenomyosis. This is when the endometrium starts to grow in the muscle of the uterus.  Pelvic organ prolapse. This is a condition in which the uterus falls down into the vagina.  Noncancerous growths in  the uterus (uterine fibroids) that cause symptoms.  The presence of precancerous cells.  Cervical or uterine cancer. What are the different types of hysterectomy? There are three different types of hysterectomy:  Supracervical hysterectomy. In this type, the top part of the uterus is removed, but not the cervix.  Total hysterectomy. In this type, the uterus and cervix are removed.  Radical hysterectomy. In this type, the uterus, the cervix, and the tissue that holds the uterus in place (parametrium) are removed. What are the different ways a hysterectomy can be performed? There are many different ways a hysterectomy can be performed, including:  Abdominal hysterectomy. In this type, an incision is made in the abdomen. The uterus is removed through this incision.  Vaginal hysterectomy. In this type, an incision is made in the vagina. The uterus is removed through this incision. There are no abdominal incisions.  Conventional laparoscopic hysterectomy. In this type, three or four small incisions are made in the abdomen. A thin, lighted tube with a camera (laparoscope) is inserted into one of the incisions. Other tools are put through the other incisions. The uterus is cut into small pieces. The small pieces are removed through the incisions or through the vagina.  Laparoscopically assisted vaginal hysterectomy (LAVH). In this type, three or four small incisions are made in the abdomen. Part of the surgery is performed laparoscopically and the other part is done vaginally. The uterus is removed through the vagina.  Robot-assisted laparoscopic hysterectomy. In this type, a laparoscope and other tools are inserted into three or four small incisions in the abdomen. A  computer-controlled device is used to give the surgeon a 3D image and to help control the surgical instruments. This allows for more precise movements of surgical instruments. The uterus is cut into small pieces and removed through the  incisions or removed through the vagina. Discuss the options with your health care provider to determine which type is the right one for you. What are the risks? Generally, this is a safe procedure. However, problems may occur, including:  Bleeding and risk of blood transfusion. Tell your health care provider if you do not want to receive any blood products.  Blood clots in the legs or lung.  Infection.  Damage to other structures or organs.  Allergic reactions to medicines.  Changing to an abdominal hysterectomy from one of the other techniques. What to expect after a hysterectomy  You will be given pain medicine.  You may need to stay in the hospital for 1- 2 days to recover, depending on the type of hysterectomy you had.  Follow your health care provider's instructions about exercise, driving, and general activities. Ask your health care provider what activities are safe for you.  You will need to have someone with you for the first 3-5 days after you go home.  You will need to follow up with your surgeon in 2-4 weeks after surgery to evaluate your progress.  If the ovaries are removed, you will have early menopause symptoms such as hot flashes, night sweats, and insomnia.  If you had a hysterectomy for a problem that was not cancer or not a condition that could lead to cancer, then you no longer need Pap tests. However, even if you no longer need a Pap test, a regular pelvic exam is a good idea to make sure no other problems are developing. Questions to ask your health care provider  Is a hysterectomy medically necessary? Do I have other treatment options for my condition?  What are my options for hysterectomy procedure?  What organs and tissues need to be removed?  What are the risks?  What are the benefits?  How long will I need to stay in the hospital after the procedure?  How long will I need to recover at home?  What symptoms can I expect after the  procedure? Summary  A hysterectomy is a surgery in which the uterus is removed. The fallopian tubes and ovaries may be removed (bilateral salpingo-oophorectomy) as well.  This procedure may be done to treat various medical problems. After the procedure, a woman will no longer have menstrual periods nor will she be able to become pregnant.  Discuss the options with your health care provider to determine which type of hysterectomy is the right one for you. This information is not intended to replace advice given to you by your health care provider. Make sure you discuss any questions you have with your health care provider. Document Revised: 04/13/2017 Document Reviewed: 06/07/2016 Elsevier Patient Education  2020 [employer].

## 2019-06-24 NOTE — Telephone Encounter (Signed)
Added to Dr. Holley Raring sched for Wed 2-10, she will need nurse call to update chart.

## 2019-06-24 NOTE — Progress Notes (Signed)
Pt present to discuss surgery. Pt stated that she was doing well no problems.

## 2019-06-24 NOTE — Progress Notes (Signed)
GYNECOLOGY PROGRESS NOTE  Subjective:    Patient ID: Sherry Hobbs, female    DOB: [birth year], 49 y.o.   MRN: [id number]  HPI  Patient is a 49 y.o. G5P1001 female who presents for discussion of management of her abnormal bleeding. She has a prior history of endometrial ablation in 2019.  Patient desires to discuss surgical management with hysterectomy. Notes that bleeding has not changed even with changing medication from Aygestin to [facility].  States that she is tired of the heavy prolonged bleeding and desires definitive management.   The following portions of the patient's history were reviewed and updated as appropriate: allergies, current medications, past family history, past medical history, past social history, past surgical history and problem list.  Review of Systems Pertinent items noted in HPI and remainder of comprehensive ROS otherwise negative.   Objective:   Blood pressure 112/69, pulse (!) 111, height 5\' 2"  (1.575 m), weight 194 lb 14.4 oz (88.4 kg). General appearance: alert and no distress Abdomen: soft, non-tender; bowel sounds normal; no masses,  no organomegaly Remainder of examination deferred, see H&P.    Labs:  Lab Results  Component Value Date   WBC 10.9 (H) 06/07/2018   HGB 12.1 06/07/2018   HCT 35.9 (L) 06/07/2018   MCV 83.7 06/07/2018   PLT 287 06/07/2018    Lab Results  Component Value Date   TSH 3.717 03/30/2016     Cytology (9/20217): Pap smear negative   Pathology (05/23/2019):  A. ENDOMETRIUM, BIOPSY:  - Inactive endometrium with progestational changes.  - No hyperplasia or carcinoma.   Imaging:  [place] GYN Pelvis Complete Patient Name: Sherry Hobbs DOB: [birth year] MRN: [id number] ULTRASOUND REPORT  Location: [facility] Date of Service: 05/22/2019   Indications:AUB Findings:  The uterus is anteverted and measures 9.0 x 5.3 x 5.4 cm. Echo texture is homogenous without evidence of focal masses.  The Endometrium measures  10 mm.  Right Ovary measures 3.0 x 1.6 x 2.3 cm. It is normal in appearance. 2  dominate follicles seen approximately  1 cm each. Left Ovary measures 3.5 x 2.3 x 3.1 cm. It is normal in appearance.  Dominate follicle approximately 1.5 cm. Survey of the adnexa demonstrates no adnexal masses. There is no free fluid in the cul de sac.  Impression: 1. Bilateral dominate follicles.  Recommendations: 1.Clinical correlation with the patient's History and Physical Exam.  Jenine M. Alessi    RDMS  .I have reviewed this study and agree with documented findings.   [name], MD [facility]    Assessment:   1. Perimenopausal menorrhagia   2. S/P endometrial ablation   3. Essential hypertension     Plan:   Patient desires definitive management with hysterectomy.  Has had prior endometrial ablation with short term relief.  I proposed doing a laparoscopic-assisted vaginal hysterectomy (LAVH) vs total vaginal hysterectomy (TVH) and prophylactic bilateral salpingectomy. No indication for oophorectomy.  Patient agrees with this proposed surgery.  The risks of surgery were discussed in detail with the patient including but not limited to: bleeding which may require transfusion or reoperation; infection which may require antibiotics; injury to bowel, bladder, ureters or other surrounding organs; need for additional procedures including laparotomy; thromboembolic phenomenon, incisional problems and other postoperative/anesthesia complications.  Patient was also advised that she will remain in house for 1-2 nights; and expected recovery time after a hysterectomy is 6-8 weeks.  Patient was told that the likelihood that her condition and symptoms will be  treated effectively with this surgical management was very high; the postoperative expectations were also discussed in detail. The patient also understands the alternative treatment options which were discussed in full. All questions were  answered.  She was told that she will be contacted by our surgical scheduler regarding the time and date of her surgery; routine preoperative instructions will be given to her by the preoperative nursing team.   She is aware of need for preoperative COVID testing and subsequent quarantine from time of test to time of surgery; she will be given further preoperative instructions at that [place] screening visit.   Routine postoperative instructions will be reviewed with the patient in detail after surgery.  In the meantime, she will discontinue Slynd and do combined OCP taper; bleeding precautions were reviewed. Printed patient education handouts about the procedure was given to the patient to review at home. Will schedule surgery for 07/07/2019.  -Essential HTN, well managed currently. Patient notes that she has resumed taking her BP meds.   A total of 15 minutes were spent face-to-face with the patient during this encounter and over half of that time dealt with counseling and coordination of care.   [name], MD [facility]

## 2019-06-24 NOTE — H&P (View-Only) (Signed)
GYNECOLOGY PREOPERATIVE HISTORY AND PHYSICAL   Subjective:  Sherry Hobbs is a 49 y.o. G1P1001 here for surgical management of perimenopausal menorrhagia.  She has a prior history of endometrial ablation in 2019. No significant preoperative concerns.  She has had a history of blood transfusion in the past due to anemia from chronic blood loss.  Proposed surgery: Laparoscopic-assisted vaginal hysterectomy (possible TVH) with bilateral salpingectomy   Pertinent Gynecological History: Menses: flow is excessive with use of 1-2 pads or tampons per hour on heaviest days and usually lasting 1 week.  Currently on progesterone supplementation so bleeding has decreased in flow, however now with persistent light spotting to light flow.  Bleeding: dysfunctional uterine bleeding Last mammogram: 2012 Last pap: normal Date: 2017   Past Medical History:  Diagnosis Date  . Anemia    problems when having abnmornal uterine bleeding.  . Arthritis    lower back  . Depression   . Diabetes mellitus without complication ([place])   . Elevated serum creatinine   . GERD (gastroesophageal reflux disease)    OCC  NO MEDS  . Headache    occasional migraines  . History of blood transfusion 05/02/2016   due to abnormal uterine bleeding  . Hypertension    Past Surgical History:  Procedure Laterality Date  . DILATION AND CURETTAGE OF UTERUS  05/03/2016   Procedure: DILATATION AND CURETTAGE;  Surgeon: [name], MD;  Location: [facility];  Service: Gynecology;;  . Murrell Redden & CURRETTAGE/HYSTROSCOPY WITH NOVASURE ABLATION N/A 12/03/2017   Procedure: DILATATION & CURETTAGE/HYSTEROSCOPY WITH MINERVA ABLATION;  Surgeon: [name], MD;  Location: [facility];  Service: Gynecology;  Laterality: N/A;  MINERVA REP CONTACTED [facility] [phone])  . LUMBAR LAMINECTOMY/DECOMPRESSION MICRODISCECTOMY Left 06/24/2018   Procedure: LUMBAR HEMILAMINECTOMY, FORAMINOTOMY, CYST REMOVAL - 1 LEVEL LEFT L4-5;  Surgeon:  [name], MD;  Location: [facility];  Service: Neurosurgery;  Laterality: Left;   OB History  Gravida Para Term Preterm AB Living  1 1 1     1   SAB TAB Ectopic Multiple Live Births          1    # Outcome Date GA Lbr Len/2nd Weight Sex Delivery Anes PTL Lv  1 Term 10/1997 [redacted]w[redacted]d  6 lb 4 oz (2.835 kg) M Vag-Spont   LIV  Patient denies any other pertinent gynecologic issues.  Family History  Problem Relation Age of Onset  . Diabetes Mother   . COPD Mother   . Emphysema Mother   . Osteoporosis Mother   . CAD Father   . Diabetes Sister   . Diabetes Brother   . CAD Brother   . Renal cancer Maternal Uncle        1 uncle  . Brain cancer Maternal Uncle        different uncle  . Diabetes Maternal Grandmother   . Stroke Maternal Grandmother   . Bladder Cancer Maternal Grandfather    Social History   Socioeconomic History  . Marital status: Married    Spouse name: Gwyndolyn Saxon  . Number of children: Not on file  . Years of education: Not on file  . Highest education level: Not on file  Occupational History  . Occupation: Solicitor  Tobacco Use  . Smoking status: Never Smoker  . Smokeless tobacco: Never Used  Substance and Sexual Activity  . Alcohol use: No  . Drug use: No  . Sexual activity: Yes    Birth control/protection: None  Other Topics Concern  .  Not on file  Social History Narrative  . Not on file   Social Determinants of Health   Financial Resource Strain:   . Difficulty of Paying Living Expenses: Not on file  Food Insecurity:   . Worried About Charity fundraiser in the Last Year: Not on file  . Ran Out of Food in the Last Year: Not on file  Transportation Needs:   . Lack of Transportation (Medical): Not on file  . Lack of Transportation (Non-Medical): Not on file  Physical Activity:   . Days of Exercise per Week: Not on file  . Minutes of Exercise per Session: Not on file  Stress:   . Feeling of Stress : Not on file  Social Connections:    . Frequency of Communication with Friends and Family: Not on file  . Frequency of Social Gatherings with Friends and Family: Not on file  . Attends Religious Services: Not on file  . Active Member of Clubs or Organizations: Not on file  . Attends Archivist Meetings: Not on file  . Marital Status: Not on file  Intimate Partner Violence:   . Fear of Current or Ex-Partner: Not on file  . Emotionally Abused: Not on file  . Physically Abused: Not on file  . Sexually Abused: Not on file   Current Outpatient Medications on File Prior to Visit  Medication Sig Dispense Refill  . calcium carbonate (TUMS - DOSED IN MG ELEMENTAL CALCIUM) 500 MG chewable tablet Chew 1 tablet by mouth as needed for indigestion or heartburn.    Marland Kitchen ibuprofen (ADVIL) 600 MG tablet TAKE (1) TABLET EVERY SIX HOURS AS NEEDED. 30 tablet 1  . ibuprofen (ADVIL) 800 MG tablet Take 1 tablet (800 mg total) by mouth 3 (three) times daily. (Patient not taking: Reported on 06/24/2019) 15 tablet 0  . lisinopril (PRINIVIL,ZESTRIL) 10 MG tablet Take 10 mg by mouth every morning.     . metFORMIN (GLUCOPHAGE) 500 MG tablet Take 500-1,000 mg by mouth See admin instructions. Take 500 mg by mouth in the morning and 1000 mg in the evening.  1  . methocarbamol (ROBAXIN) 750 MG tablet Take 1 tablet (750 mg total) by mouth every 8 (eight) hours as needed for muscle spasms. 90 tablet 1  . pregabalin (LYRICA) 75 MG capsule Take 1 capsule (75 mg total) by mouth 2 (two) times daily. 60 capsule 1   No current facility-administered medications on file prior to visit.   Allergies  Allergen Reactions  . Codeine Nausea And Vomiting      Review of Systems Constitutional: No recent fever/chills/sweats Respiratory: No recent cough/bronchitis Cardiovascular: No chest pain Gastrointestinal: No recent nausea/vomiting/diarrhea Genitourinary: No UTI symptoms Hematologic/lymphatic:No history of coagulopathy or recent blood thinner use      Objective:   Blood pressure 112/69, pulse (!) 111, height 5\' 2"  (1.575 m), weight 194 lb 14.4 oz (88.4 kg). CONSTITUTIONAL: Well-developed, well-nourished female in no acute distress.  HENT:  Normocephalic, atraumatic, External right and left ear normal. Oropharynx is clear and moist EYES: Conjunctivae and EOM are normal. Pupils are equal, round, and reactive to light. No scleral icterus.  NECK: Normal range of motion, supple, no masses SKIN: Skin is warm and dry. No rash noted. Not diaphoretic. No erythema. No pallor. NEUROLOGIC: Alert and oriented to person, place, and time. Normal reflexes, muscle tone coordination. No cranial nerve deficit noted. PSYCHIATRIC: Normal mood and affect. Normal behavior. Normal judgment and thought content. CARDIOVASCULAR: Normal heart rate noted, regular  rhythm RESPIRATORY: Effort and breath sounds normal, no problems with respiration noted ABDOMEN: Soft, nontender, nondistended. PELVIC: Deferred MUSCULOSKELETAL: Normal range of motion. No edema and no tenderness. 2+ distal pulses.    Labs: No results found for this or any previous visit (from the past 336 hour(s)).  Pathology (05/23/2019):  A. ENDOMETRIUM, BIOPSY:  - Inactive endometrium with progestational changes.  - No hyperplasia or carcinoma.    Imaging Studies: [place] GYN Pelvis Complete Patient Name: Sherry Hobbs DOB: [birth year] MRN: [id number] ULTRASOUND REPORT  Location: [facility] Date of Service: 05/22/2019   Indications:AUB Findings:  The uterus is anteverted and measures 9.0 x 5.3 x 5.4 cm. Echo texture is homogenous without evidence of focal masses.  The Endometrium measures 10 mm.  Right Ovary measures 3.0 x 1.6 x 2.3 cm. It is normal in appearance. 2  dominate follicles seen approximately  1 cm each. Left Ovary measures 3.5 x 2.3 x 3.1 cm. It is normal in appearance.  Dominate follicle approximately 1.5 cm. Survey of the adnexa demonstrates no adnexal  masses. There is no free fluid in the cul de sac.  Impression: 1. Bilateral dominate follicles.  Recommendations: 1.Clinical correlation with the patient's History and Physical Exam.  Jenine M. Alessi    RDMS  .I have reviewed this study and agree with documented findings.   [name], MD [facility]    Assessment:    1. Perimenopausal menorrhagia   2. S/P endometrial ablation   3. Essential hypertension    Plan:    Counseling: Procedure, risks, reasons, benefits and complications (including injury to bowel, bladder, major blood vessel, ureter, bleeding, possibility of transfusion, infection, or fistula formation) reviewed in detail. Likelihood of success in alleviating the patient's condition was discussed. Routine postoperative instructions will be reviewed with the patient and her family in detail after surgery.  The patient concurred with the proposed plan, giving informed written consent for the surgery.   Preop testing ordered. Instructions reviewed, including NPO after midnight.     [name], MD [facility]

## 2019-06-25 ENCOUNTER — Ambulatory Visit
Payer: Managed Care, Other (non HMO) | Attending: Student in an Organized Health Care Education/Training Program | Admitting: Student in an Organized Health Care Education/Training Program

## 2019-06-25 DIAGNOSIS — G894 Chronic pain syndrome: Secondary | ICD-10-CM

## 2019-06-25 NOTE — Progress Notes (Signed)
I attempted to call the patient however no response. Voicemail left instructing patient to call front desk office at [phone] to reschedule appointment. -Dr Ryzen Deady  

## 2019-06-26 ENCOUNTER — Encounter: Payer: Managed Care, Other (non HMO) | Admitting: Student in an Organized Health Care Education/Training Program

## 2019-06-30 ENCOUNTER — Encounter: Payer: Self-pay | Admitting: Student in an Organized Health Care Education/Training Program

## 2019-07-01 ENCOUNTER — Ambulatory Visit
Payer: Managed Care, Other (non HMO) | Attending: Student in an Organized Health Care Education/Training Program | Admitting: Student in an Organized Health Care Education/Training Program

## 2019-07-01 ENCOUNTER — Encounter: Payer: Self-pay | Admitting: Student in an Organized Health Care Education/Training Program

## 2019-07-01 ENCOUNTER — Other Ambulatory Visit: Payer: Self-pay

## 2019-07-01 DIAGNOSIS — M4726 Other spondylosis with radiculopathy, lumbar region: Secondary | ICD-10-CM

## 2019-07-01 DIAGNOSIS — G894 Chronic pain syndrome: Secondary | ICD-10-CM | POA: Diagnosis not present

## 2019-07-01 DIAGNOSIS — Z9889 Other specified postprocedural states: Secondary | ICD-10-CM

## 2019-07-01 DIAGNOSIS — M47816 Spondylosis without myelopathy or radiculopathy, lumbar region: Secondary | ICD-10-CM

## 2019-07-01 DIAGNOSIS — G8929 Other chronic pain: Secondary | ICD-10-CM

## 2019-07-01 DIAGNOSIS — M5416 Radiculopathy, lumbar region: Secondary | ICD-10-CM

## 2019-07-01 MED ORDER — PREGABALIN 100 MG PO CAPS: 100.0000 mg | ORAL_CAPSULE | Freq: Two times a day (BID) | ORAL | 2 refills | Status: DC

## 2019-07-01 NOTE — Progress Notes (Signed)
Patient: Sherry Hobbs  Service Category: E/M  Provider: Gillis Santa, MD  DOB: [birth year]  DOS: 07/01/2019  Location: Office  MRN: [id number]  Setting: Ambulatory outpatient  Referring Provider: Theotis Burrow*  Type: Established Patient  Specialty: Interventional Pain Management  PCP: Theotis Burrow, MD  Location: Home  Delivery: TeleHealth     Virtual Encounter - Pain Management PROVIDER NOTE: Information contained herein reflects review and annotations entered in association with encounter. Interpretation of such information and data should be left to medically-trained personnel. Information provided to patient can be located elsewhere in the medical record under "Patient Instructions". Document created using STT-dictation technology, any transcriptional errors that may result from process are unintentional.    Contact & Pharmacy Preferred: [phone] Home: [phone] (home) Mobile: [phone] (mobile) E-mail: [email or web address]  [id number] [place]: [phone]: [phone]  Pre-screening  Sherry Hobbs offered "in-person" vs "virtual" encounter. She indicated preferring virtual for this encounter.   Reason COVID-19*  Social distancing based on CDC and AMA recommendations.   I contacted CLewis Moccasinon 07/01/2019 via telephone.      I clearly identified myself as BGillis Santa MD. I verified that I was speaking with the correct person using two identifiers (Name: Sherry Hobbs and date of birth: 47/20/1972.  Consent I sought verbal advanced consent from CLewis Moccasinfor virtual visit interactions. I informed Sherry Hobbs of possible security and privacy concerns, risks, and limitations associated with providing "not-in-person" medical evaluation and management services. I also informed Sherry Hobbs of the availability of "in-person" appointments. Finally, I informed  her that there would be a charge for the virtual visit and that she could be  personally, fully or partially, financially responsible for it. Ms. BDelprioreexpressed understanding and agreed to proceed.   Historic Elements   Ms. CYAFFA SECKMANis a 49y.o. year old, female patient evaluated today after her last contact with our practice on 06/25/2019. Sherry Hobbs has a past medical history of Anemia, Arthritis, Depression, Diabetes mellitus without complication ([place], Elevated serum creatinine, GERD (gastroesophageal reflux disease), Headache, History of blood transfusion (05/02/2016), and Hypertension. She also  has a past surgical history that includes Dilation and curettage of uterus (05/03/2016); Dilatation & currettage/hysteroscopy with novasure ablation (N/A, 12/03/2017); and Lumbar laminectomy/decompression microdiscectomy (Left, 06/24/2018). Ms. BDueyhas a current medication list which includes the following prescription(s): lisinopril, metformin, methocarbamol, and pregabalin. She  reports that she has never smoked. She has never used smokeless tobacco. She reports that she does not drink alcohol or use drugs. Ms. BGledhillis allergic to codeine.   HPI  Today, she is being contacted for medication management.  No significant changes since the patient's last visit with me on April 02, 2019.  Of note she is status post L5-S1 epidural steroid injection on the left on March 05, 2019 which was very beneficial for her radicular pain.  She has positive therapeutic response from this.  Can consider repeating as needed if symptoms return.  Patient is endorsing numbness and a feeling of coolness of her feet.  This is likely related to neuropathic pain.  She is on Lyrica 75 mg twice a day.  We discussed increasing that to 100 mg twice daily.  Risks and benefits were reviewed.   Laboratory Chemistry Profile   Renal Lab Results  Component Value Date   BUN 14 06/07/2018   CREATININE 1.09 (  H)  06/07/2018   GFRAA >60 06/07/2018   GFRNONAA >60 06/07/2018    Hepatic Lab Results  Component Value Date   AST 20 05/02/2016   ALT 16 05/02/2016   ALBUMIN 4.0 05/02/2016   ALKPHOS 72 05/02/2016   HCVAB <0.1 03/30/2016    Electrolytes Lab Results  Component Value Date   NA 138 06/07/2018   K 3.9 06/07/2018   CL 105 06/07/2018   CALCIUM 9.6 06/07/2018    Bone No results found for: [place], [id number], WC3762GB1, DV7616WV3, 25OHVITD1, 25OHVITD2, 25OHVITD3, TESTOFREE, TESTOSTERONE  Coagulation Lab Results  Component Value Date   INR 0.97 06/07/2018   LABPROT 12.8 06/07/2018   APTT 26 06/07/2018   PLT 287 06/07/2018    Cardiovascular Lab Results  Component Value Date   TROPONINI <0.03 03/30/2016   HGB 12.1 06/07/2018   HCT 35.9 (L) 06/07/2018    Inflammation (CRP: Acute Phase) (ESR: Chronic Phase) No results found for: CRP, ESRSEDRATE, LATICACIDVEN    Note: Above Lab results reviewed.  Imaging  [place] GYN Pelvis Complete Patient Name: Sherry Hobbs DOB: [birth year] MRN: [id number] ULTRASOUND REPORT  Location: [facility] Date of Service: 05/22/2019   Indications:AUB Findings:  The uterus is anteverted and measures 9.0 x 5.3 x 5.4 cm. Echo texture is homogenous without evidence of focal masses.  The Endometrium measures 10 mm.  Right Ovary measures 3.0 x 1.6 x 2.3 cm. It is normal in appearance. 2  dominate follicles seen approximately  1 cm each. Left Ovary measures 3.5 x 2.3 x 3.1 cm. It is normal in appearance.  Dominate follicle approximately 1.5 cm. Survey of the adnexa demonstrates no adnexal masses. There is no free fluid in the cul de sac.  Impression: 1. Bilateral dominate follicles.  Recommendations: 1.Clinical correlation with the patient's History and Physical Exam.  Sherry Hobbs    RDMS  .I have reviewed this study and agree with documented findings.   [name], MD [facility]  Assessment  The primary encounter  diagnosis was Chronic pain syndrome. Diagnoses of Chronic radicular lumbar pain (left), History of lumbar surgery (L4-L5 laminectomy for cyst removal Dr Lacinda Axon 06/2018), Lumbar radiculopathy (left), and Lumbar facet arthropathy were also pertinent to this visit.  Plan of Care   I have discontinued Kaarin M. Keeble's ibuprofen, pregabalin, and ibuprofen. I am also having her start on pregabalin. Additionally, I am having her maintain her lisinopril, metFORMIN, and methocarbamol.  1.  Increase Lyrica from 75 mg twice daily to 100 mg twice daily. 2.  As needed left L5-S1 ESI.  Pharmacotherapy (Medications Ordered): Meds ordered this encounter  Medications  . pregabalin (LYRICA) 100 MG capsule    Sig: Take 1 capsule (100 mg total) by mouth 2 (two) times daily.    Dispense:  60 capsule    Refill:  2    Fill one day early if pharmacy is closed on scheduled refill date. May substitute for generic if available.    Follow-up plan:   Return in about 3 months (around 09/28/2019) for Medication Management.     History of L4-5 laminectomy with Dr. Lacinda Axon due to synovial cyst now having left L4-L5 radicular pain status post L5-S1 ESI on the left on 03/05/2019     Recent Visits Date Type Provider Dept  04/02/19 Office Visit Gillis Santa, MD [facility]  Showing recent visits within past 90 days and meeting all other requirements   Today's Visits Date Type Provider Dept  07/01/19 Office Visit Holley Raring,  Carlus Pavlov, MD [facility]  Showing today's visits and meeting all other requirements   Future Appointments No visits were found meeting these conditions.  Showing future appointments within next 90 days and meeting all other requirements   I discussed the assessment and treatment plan with the patient. The patient was provided an opportunity to ask questions and all were answered. The patient agreed with the plan and demonstrated an understanding of the instructions.  Patient advised to  call back or seek an in-person evaluation if the symptoms or condition worsens.  Duration of encounter: 84mnutes.  Note by: BGillis Santa MD Date: 07/01/2019; Time: 8:39 AM

## 2019-07-02 ENCOUNTER — Encounter
Admission: RE | Admit: 2019-07-02 | Discharge: 2019-07-02 | Disposition: A | Discharge status: Home / Self Care | Payer: Managed Care, Other (non HMO) | Source: Ambulatory Visit | Attending: Obstetrics and Gynecology | Admitting: Obstetrics and Gynecology

## 2019-07-02 ENCOUNTER — Other Ambulatory Visit: Payer: Self-pay

## 2019-07-02 HISTORY — DX: Polyneuropathy, unspecified: G62.9

## 2019-07-02 NOTE — Patient Instructions (Signed)
Your procedure is scheduled on: 07/07/19 Report to [place]. To find out your arrival time please call [phone] between 1PM - 3PM on 07/04/19.  Remember: Instructions that are not followed completely may result in serious medical risk, up to and including death, or upon the discretion of your surgeon and anesthesiologist your surgery may need to be rescheduled.     _X__ 1. Do not eat food after midnight the night before your procedure.                 No gum chewing or hard candies. You may drink clear liquids up to 2 hours                 before you are scheduled to arrive for your surgery- DO not drink clear                 liquids within 2 hours of the start of your surgery.                 Clear Liquids include:  water, apple juice without pulp, clear carbohydrate                 drink such as Clearfast or Gatorade, Black Coffee or Tea (Do not add                 anything to coffee or tea). Diabetics water only  __X__2.  On the morning of surgery brush your teeth with toothpaste and water, you                 may rinse your mouth with mouthwash if you wish.  Do not swallow any              toothpaste of mouthwash.     _X__ 3.  No Alcohol for 24 hours before or after surgery.   _X__ 4.  Do Not Smoke or use e-cigarettes For 24 Hours Prior to Your Surgery.                 Do not use any chewable tobacco products for at least 6 hours prior to                 surgery.  ____  5.  Bring all medications with you on the day of surgery if instructed.   __X__  6.  Notify your doctor if there is any change in your medical condition      (cold, fever, infections).     Do not wear jewelry, make-up, hairpins, clips or nail polish. Do not wear lotions, powders, or perfumes.  Do not shave 48 hours prior to surgery. Men may shave face and neck. Do not bring valuables to the hospital.    [facility] is not responsible for any belongings or  valuables.  Contacts, dentures/partials or body piercings may not be worn into surgery. Bring a case for your contacts, glasses or hearing aids, a denture cup will be supplied. Leave your suitcase in the car. After surgery it may be brought to your room. For patients admitted to the hospital, discharge time is determined by your treatment team.   Patients discharged the day of surgery will not be allowed to drive home.   Please read over the following fact sheets that you were given:   MRSA Information  __X__ Take these medicines the morning of surgery with A SIP OF WATER:  1. none  2.   3.   4.  5.  6.  ____ Fleet Enema (as directed)   __X__ Use CHG Soap/SAGE wipes as directed  ____ Use inhalers on the day of surgery  __X__ Stop metformin/Janumet/Farxiga 2 days prior to surgery    ____ Take 1/2 of usual insulin dose the night before surgery. No insulin the morning          of surgery.   ____ Stop Blood Thinners Coumadin/Plavix/Xarelto/Pleta/Pradaxa/Eliquis/Effient/Aspirin  on   Or contact your Surgeon, Cardiologist or Medical Doctor regarding  ability to stop your blood thinners  __X__ Stop Anti-inflammatories 7 days before surgery such as Advil, Ibuprofen, Motrin,  BC or Goodies Powder, Naprosyn, Naproxen, Aleve, Aspirin    __X__ Stop all herbal supplements, fish oil or vitamin E until after surgery.    ____ Bring C-Pap to the hospital.    G2 GATORADE TO BE COMPLETED 2 HOURS BEFORE ARRIVAL.  INCENTIVE SPIROMETER AND INSTRUCTIONS ENCLOSED.

## 2019-07-03 ENCOUNTER — Encounter
Admission: RE | Admit: 2019-07-03 | Discharge: 2019-07-03 | Disposition: A | Discharge status: Home / Self Care | Payer: Managed Care, Other (non HMO) | Source: Ambulatory Visit | Attending: Obstetrics and Gynecology | Admitting: Obstetrics and Gynecology

## 2019-07-03 DIAGNOSIS — I10 Essential (primary) hypertension: Secondary | ICD-10-CM

## 2019-07-03 DIAGNOSIS — Z01812 Encounter for preprocedural laboratory examination: Secondary | ICD-10-CM | POA: Insufficient documentation

## 2019-07-03 DIAGNOSIS — Z20822 Contact with and (suspected) exposure to covid-19: Secondary | ICD-10-CM | POA: Diagnosis not present

## 2019-07-03 LAB — CBC
HCT: 29.8 % — ABNORMAL LOW (ref 36.0–46.0)
Hemoglobin: 9.7 g/dL — ABNORMAL LOW (ref 12.0–15.0)
MCH: 29 pg (ref 26.0–34.0)
MCHC: 32.6 g/dL (ref 30.0–36.0)
MCV: 89 fL (ref 80.0–100.0)
Platelets: 298 10*3/uL (ref 150–400)
RBC: 3.35 MIL/uL — ABNORMAL LOW (ref 3.87–5.11)
RDW: 18.1 % — ABNORMAL HIGH (ref 11.5–15.5)
WBC: 10.4 10*3/uL (ref 4.0–10.5)
nRBC: 0 % (ref 0.0–0.2)

## 2019-07-03 LAB — COMPREHENSIVE METABOLIC PANEL
ALT: 22 U/L (ref 0–44)
AST: 17 U/L (ref 15–41)
Albumin: 3.5 g/dL (ref 3.5–5.0)
Alkaline Phosphatase: 71 U/L (ref 38–126)
Anion gap: 10 (ref 5–15)
BUN: 10 mg/dL (ref 6–20)
CO2: 22 mmol/L (ref 22–32)
Calcium: 9.4 mg/dL (ref 8.9–10.3)
Chloride: 104 mmol/L (ref 98–111)
Creatinine, Ser: 1.07 mg/dL — ABNORMAL HIGH (ref 0.44–1.00)
GFR calc Af Amer: >60 mL/min (ref >60)
GFR calc non Af Amer: >60 mL/min (ref >60)
Glucose, Bld: 146 mg/dL — ABNORMAL HIGH (ref 70–99)
Potassium: 4.3 mmol/L (ref 3.5–5.1)
Sodium: 136 mmol/L (ref 135–145)
Total Bilirubin: 0.6 mg/dL (ref 0.3–1.2)
Total Protein: 7.2 g/dL (ref 6.5–8.1)

## 2019-07-03 LAB — SARS CORONAVIRUS 2 (TAT 6-24 HRS): SARS Coronavirus 2: NEGATIVE

## 2019-07-03 LAB — TYPE AND SCREEN
ABO/RH(D): O POS
Antibody Screen: NEGATIVE

## 2019-07-03 LAB — RAPID HIV SCREEN (HIV 1/2 AB+AG)
HIV 1/2 Antibodies: NONREACTIVE
HIV-1 P24 Antigen - HIV24: NONREACTIVE

## 2019-07-04 LAB — RPR: RPR Ser Ql: NONREACTIVE

## 2019-07-07 ENCOUNTER — Ambulatory Visit: Payer: Managed Care, Other (non HMO) | Admitting: Anesthesiology

## 2019-07-07 ENCOUNTER — Other Ambulatory Visit: Payer: Self-pay | Admitting: Obstetrics and Gynecology

## 2019-07-07 ENCOUNTER — Encounter
Admission: RE | Disposition: A | Discharge status: Home / Self Care | Payer: Self-pay | Source: Home / Self Care | Attending: Obstetrics and Gynecology

## 2019-07-07 ENCOUNTER — Observation Stay
Admission: RE | Admit: 2019-07-07 | Discharge: 2019-07-08 | Disposition: A | Discharge status: Home / Self Care | Payer: Managed Care, Other (non HMO) | Attending: Obstetrics and Gynecology | Admitting: Obstetrics and Gynecology

## 2019-07-07 ENCOUNTER — Other Ambulatory Visit: Payer: Self-pay

## 2019-07-07 ENCOUNTER — Encounter: Payer: Self-pay | Admitting: Obstetrics and Gynecology

## 2019-07-07 DIAGNOSIS — D500 Iron deficiency anemia secondary to blood loss (chronic): Secondary | ICD-10-CM

## 2019-07-07 DIAGNOSIS — E1140 Type 2 diabetes mellitus with diabetic neuropathy, unspecified: Secondary | ICD-10-CM | POA: Diagnosis not present

## 2019-07-07 DIAGNOSIS — E119 Type 2 diabetes mellitus without complications: Secondary | ICD-10-CM

## 2019-07-07 DIAGNOSIS — Z79899 Other long term (current) drug therapy: Secondary | ICD-10-CM | POA: Diagnosis not present

## 2019-07-07 DIAGNOSIS — Z90710 Acquired absence of both cervix and uterus: Secondary | ICD-10-CM | POA: Diagnosis present

## 2019-07-07 DIAGNOSIS — I10 Essential (primary) hypertension: Secondary | ICD-10-CM

## 2019-07-07 DIAGNOSIS — N938 Other specified abnormal uterine and vaginal bleeding: Secondary | ICD-10-CM

## 2019-07-07 DIAGNOSIS — Z7984 Long term (current) use of oral hypoglycemic drugs: Secondary | ICD-10-CM | POA: Insufficient documentation

## 2019-07-07 DIAGNOSIS — Z6835 Body mass index (BMI) 35.0-35.9, adult: Secondary | ICD-10-CM | POA: Insufficient documentation

## 2019-07-07 DIAGNOSIS — N924 Excessive bleeding in the premenopausal period: Principal | ICD-10-CM | POA: Insufficient documentation

## 2019-07-07 HISTORY — PX: LAPAROSCOPIC ASSISTED VAGINAL HYSTERECTOMY: SHX5398

## 2019-07-07 HISTORY — PX: LAPAROSCOPIC BILATERAL SALPINGECTOMY: SHX5889

## 2019-07-07 LAB — GLUCOSE, CAPILLARY
Glucose-Capillary: 195 mg/dL — ABNORMAL HIGH (ref 70–99)
Glucose-Capillary: 247 mg/dL — ABNORMAL HIGH (ref 70–99)
Glucose-Capillary: 267 mg/dL — ABNORMAL HIGH (ref 70–99)
Glucose-Capillary: 296 mg/dL — ABNORMAL HIGH (ref 70–99)

## 2019-07-07 LAB — POCT PREGNANCY, URINE: Preg Test, Ur: NEGATIVE

## 2019-07-07 SURGERY — HYSTERECTOMY, VAGINAL, LAPAROSCOPY-ASSISTED
Anesthesia: General

## 2019-07-07 MED ORDER — KETOROLAC TROMETHAMINE 30 MG/ML IJ SOLN: 30.0000 mg | Freq: Once | INTRAMUSCULAR | Status: DC

## 2019-07-07 MED ORDER — KETOROLAC TROMETHAMINE 30 MG/ML IJ SOLN
30.0000 mg | Freq: Four times a day (QID) | INTRAMUSCULAR | Status: AC
  Administered 2019-07-07 – 2019-07-08 (×3): 30 mg via INTRAVENOUS
  Filled 2019-07-07 (×4): qty 1

## 2019-07-07 MED ORDER — SODIUM CHLORIDE 0.9 % IV SOLN
INTRAVENOUS | Status: DC | PRN
  Administered 2019-07-07: 20 ug/min via INTRAVENOUS
  Administered 2019-07-07: 08:00:00 30 ug/min via INTRAVENOUS

## 2019-07-07 MED ORDER — INSULIN ASPART 100 UNIT/ML ~~LOC~~ SOLN
0.0000 [IU] | Freq: Every day | SUBCUTANEOUS | Status: DC
  Administered 2019-07-07: 22:00:00 3 [IU] via SUBCUTANEOUS
  Filled 2019-07-07: qty 1

## 2019-07-07 MED ORDER — TRAMADOL HCL 50 MG PO TABS
50.0000 mg | ORAL_TABLET | Freq: Four times a day (QID) | ORAL | Status: DC | PRN
  Administered 2019-07-08: 50 mg via ORAL
  Filled 2019-07-07: qty 1

## 2019-07-07 MED ORDER — ONDANSETRON HCL 4 MG/2ML IJ SOLN
INTRAMUSCULAR | Status: AC
  Filled 2019-07-07: qty 2

## 2019-07-07 MED ORDER — FENTANYL CITRATE (PF) 100 MCG/2ML IJ SOLN
INTRAMUSCULAR | Status: AC
  Filled 2019-07-07: qty 2

## 2019-07-07 MED ORDER — FAMOTIDINE 20 MG PO TABS: 20.0000 mg | ORAL_TABLET | Freq: Once | ORAL | Status: AC

## 2019-07-07 MED ORDER — LIDOCAINE HCL (PF) 2 % IJ SOLN
INTRAMUSCULAR | Status: AC
  Filled 2019-07-07: qty 10

## 2019-07-07 MED ORDER — MEPERIDINE HCL 50 MG/ML IJ SOLN: 6.2500 mg | INTRAMUSCULAR | Status: DC | PRN

## 2019-07-07 MED ORDER — EPHEDRINE SULFATE 50 MG/ML IJ SOLN
INTRAMUSCULAR | Status: AC
  Filled 2019-07-07: qty 1

## 2019-07-07 MED ORDER — EPHEDRINE SULFATE 50 MG/ML IJ SOLN
INTRAMUSCULAR | Status: DC | PRN
  Administered 2019-07-07 (×2): 5 mg via INTRAVENOUS

## 2019-07-07 MED ORDER — GLYCOPYRROLATE 0.2 MG/ML IJ SOLN
INTRAMUSCULAR | Status: DC | PRN
  Administered 2019-07-07: .2 mg via INTRAVENOUS

## 2019-07-07 MED ORDER — PHENYLEPHRINE HCL (PRESSORS) 10 MG/ML IV SOLN
INTRAVENOUS | Status: AC
  Filled 2019-07-07: qty 1

## 2019-07-07 MED ORDER — DEXMEDETOMIDINE HCL IN NACL 200 MCG/50ML IV SOLN
INTRAVENOUS | Status: DC | PRN
  Administered 2019-07-07: 4 ug via INTRAVENOUS

## 2019-07-07 MED ORDER — METFORMIN HCL 500 MG PO TABS
1000.0000 mg | ORAL_TABLET | Freq: Every day | ORAL | Status: DC
  Administered 2019-07-08: 09:00:00 1000 mg via ORAL
  Filled 2019-07-07: qty 2

## 2019-07-07 MED ORDER — GLYCOPYRROLATE 0.2 MG/ML IJ SOLN
INTRAMUSCULAR | Status: AC
  Filled 2019-07-07: qty 1

## 2019-07-07 MED ORDER — ZOLPIDEM TARTRATE 5 MG PO TABS: 5.0000 mg | ORAL_TABLET | Freq: Every evening | ORAL | Status: DC | PRN

## 2019-07-07 MED ORDER — SUCCINYLCHOLINE CHLORIDE 20 MG/ML IJ SOLN
INTRAMUSCULAR | Status: AC
  Filled 2019-07-07: qty 1

## 2019-07-07 MED ORDER — MENTHOL 3 MG MT LOZG
1.0000 | LOZENGE | OROMUCOSAL | Status: DC | PRN
  Filled 2019-07-07: qty 9

## 2019-07-07 MED ORDER — PROMETHAZINE HCL 25 MG/ML IJ SOLN: 6.2500 mg | INTRAMUSCULAR | Status: DC | PRN

## 2019-07-07 MED ORDER — DEXAMETHASONE SODIUM PHOSPHATE 10 MG/ML IJ SOLN
INTRAMUSCULAR | Status: DC | PRN
  Administered 2019-07-07: 10 mg via INTRAVENOUS

## 2019-07-07 MED ORDER — GABAPENTIN 300 MG PO CAPS: 300.0000 mg | ORAL_CAPSULE | ORAL | Status: AC

## 2019-07-07 MED ORDER — HYDROMORPHONE HCL 1 MG/ML IJ SOLN
INTRAMUSCULAR | Status: AC
  Filled 2019-07-07: qty 1

## 2019-07-07 MED ORDER — PHENYLEPHRINE HCL (PRESSORS) 10 MG/ML IV SOLN
INTRAVENOUS | Status: DC | PRN
  Administered 2019-07-07: 100 ug via INTRAVENOUS
  Administered 2019-07-07: 200 ug via INTRAVENOUS

## 2019-07-07 MED ORDER — BUPIVACAINE HCL 0.5 % IJ SOLN
INTRAMUSCULAR | Status: DC | PRN
  Administered 2019-07-07: 5 mL

## 2019-07-07 MED ORDER — BISACODYL 10 MG RE SUPP: 10.0000 mg | Freq: Every day | RECTAL | Status: DC | PRN

## 2019-07-07 MED ORDER — ONDANSETRON HCL 4 MG/2ML IJ SOLN
INTRAMUSCULAR | Status: DC | PRN
  Administered 2019-07-07 (×2): 4 mg via INTRAVENOUS

## 2019-07-07 MED ORDER — CEFAZOLIN SODIUM-DEXTROSE 2-4 GM/100ML-% IV SOLN
INTRAVENOUS | Status: AC
  Filled 2019-07-07: qty 100

## 2019-07-07 MED ORDER — INSULIN ASPART 100 UNIT/ML ~~LOC~~ SOLN
0.0000 [IU] | Freq: Three times a day (TID) | SUBCUTANEOUS | Status: DC
  Administered 2019-07-08: 12:00:00 5 [IU] via SUBCUTANEOUS
  Filled 2019-07-07: qty 1

## 2019-07-07 MED ORDER — OXYCODONE-ACETAMINOPHEN 5-325 MG PO TABS: 1.0000 | ORAL_TABLET | ORAL | Status: DC | PRN

## 2019-07-07 MED ORDER — ESMOLOL HCL 100 MG/10ML IV SOLN
INTRAVENOUS | Status: DC | PRN
  Administered 2019-07-07: 10 mg via INTRAVENOUS
  Administered 2019-07-07: 20 mg via INTRAVENOUS

## 2019-07-07 MED ORDER — FAMOTIDINE 20 MG PO TABS
ORAL_TABLET | ORAL | Status: AC
  Administered 2019-07-07: 06:00:00 20 mg via ORAL
  Filled 2019-07-07: qty 1

## 2019-07-07 MED ORDER — LIDOCAINE HCL (CARDIAC) PF 100 MG/5ML IV SOSY
PREFILLED_SYRINGE | INTRAVENOUS | Status: DC | PRN
  Administered 2019-07-07: 100 mg via INTRAVENOUS

## 2019-07-07 MED ORDER — SIMETHICONE 80 MG PO CHEW
80.0000 mg | CHEWABLE_TABLET | Freq: Four times a day (QID) | ORAL | Status: DC | PRN
  Administered 2019-07-07 – 2019-07-08 (×3): 80 mg via ORAL
  Filled 2019-07-07 (×3): qty 1

## 2019-07-07 MED ORDER — FENTANYL CITRATE (PF) 100 MCG/2ML IJ SOLN
INTRAMUSCULAR | Status: DC | PRN
  Administered 2019-07-07: 50 ug via INTRAVENOUS
  Administered 2019-07-07: 100 ug via INTRAVENOUS

## 2019-07-07 MED ORDER — OXYCODONE HCL 5 MG/5ML PO SOLN: 5.0000 mg | Freq: Once | ORAL | Status: DC | PRN

## 2019-07-07 MED ORDER — PROPOFOL 10 MG/ML IV BOLUS
INTRAVENOUS | Status: AC
  Filled 2019-07-07: qty 20

## 2019-07-07 MED ORDER — PREGABALIN 50 MG PO CAPS
100.0000 mg | ORAL_CAPSULE | Freq: Two times a day (BID) | ORAL | Status: DC
  Administered 2019-07-07 – 2019-07-08 (×2): 100 mg via ORAL
  Filled 2019-07-07: qty 2
  Filled 2019-07-07 (×2): qty 1
  Filled 2019-07-07 (×2): qty 2

## 2019-07-07 MED ORDER — BUPIVACAINE HCL (PF) 0.5 % IJ SOLN
INTRAMUSCULAR | Status: AC
  Filled 2019-07-07: qty 30

## 2019-07-07 MED ORDER — ACETAMINOPHEN 500 MG PO TABS
ORAL_TABLET | ORAL | Status: AC
  Administered 2019-07-07: 06:00:00 1000 mg via ORAL
  Filled 2019-07-07: qty 2

## 2019-07-07 MED ORDER — ACETAMINOPHEN 500 MG PO TABS: 1000.0000 mg | ORAL_TABLET | ORAL | Status: AC

## 2019-07-07 MED ORDER — HYDROMORPHONE HCL 1 MG/ML IJ SOLN
INTRAMUSCULAR | Status: DC | PRN
  Administered 2019-07-07 (×2): .5 mg via INTRAVENOUS

## 2019-07-07 MED ORDER — LACTATED RINGERS IV SOLN: INTRAVENOUS | Status: DC

## 2019-07-07 MED ORDER — PROPOFOL 10 MG/ML IV BOLUS
INTRAVENOUS | Status: DC | PRN
  Administered 2019-07-07: 160 mg via INTRAVENOUS

## 2019-07-07 MED ORDER — MIDAZOLAM HCL 2 MG/2ML IJ SOLN
INTRAMUSCULAR | Status: AC
  Filled 2019-07-07: qty 2

## 2019-07-07 MED ORDER — DEXAMETHASONE SODIUM PHOSPHATE 10 MG/ML IJ SOLN
INTRAMUSCULAR | Status: AC
  Filled 2019-07-07: qty 1

## 2019-07-07 MED ORDER — ONDANSETRON HCL 4 MG PO TABS: 4.0000 mg | ORAL_TABLET | Freq: Four times a day (QID) | ORAL | Status: DC | PRN

## 2019-07-07 MED ORDER — DOCUSATE SODIUM 100 MG PO CAPS
100.0000 mg | ORAL_CAPSULE | Freq: Two times a day (BID) | ORAL | Status: DC
  Administered 2019-07-07 – 2019-07-08 (×3): 100 mg via ORAL
  Filled 2019-07-07 (×3): qty 1

## 2019-07-07 MED ORDER — ROCURONIUM BROMIDE 100 MG/10ML IV SOLN
INTRAVENOUS | Status: DC | PRN
  Administered 2019-07-07 (×2): 20 mg via INTRAVENOUS
  Administered 2019-07-07: 50 mg via INTRAVENOUS
  Administered 2019-07-07: 10 mg via INTRAVENOUS

## 2019-07-07 MED ORDER — SODIUM CHLORIDE (PF) 0.9 % IJ SOLN
INTRAMUSCULAR | Status: AC
  Filled 2019-07-07: qty 50

## 2019-07-07 MED ORDER — ROCURONIUM BROMIDE 50 MG/5ML IV SOLN
INTRAVENOUS | Status: AC
  Filled 2019-07-07: qty 1

## 2019-07-07 MED ORDER — MIDAZOLAM HCL 2 MG/2ML IJ SOLN
INTRAMUSCULAR | Status: DC | PRN
  Administered 2019-07-07: 2 mg via INTRAVENOUS

## 2019-07-07 MED ORDER — MAGNESIUM CITRATE PO SOLN
1.0000 | Freq: Once | ORAL | Status: DC | PRN
  Filled 2019-07-07: qty 296

## 2019-07-07 MED ORDER — GABAPENTIN 300 MG PO CAPS
ORAL_CAPSULE | ORAL | Status: AC
  Administered 2019-07-07: 06:00:00 300 mg via ORAL
  Filled 2019-07-07: qty 1

## 2019-07-07 MED ORDER — IBUPROFEN 800 MG PO TABS: 800.0000 mg | ORAL_TABLET | Freq: Four times a day (QID) | ORAL | Status: DC

## 2019-07-07 MED ORDER — PANTOPRAZOLE SODIUM 40 MG PO TBEC
40.0000 mg | DELAYED_RELEASE_TABLET | Freq: Every day | ORAL | Status: DC
  Administered 2019-07-07 – 2019-07-08 (×2): 40 mg via ORAL
  Filled 2019-07-07 (×2): qty 1

## 2019-07-07 MED ORDER — ALUM & MAG HYDROXIDE-SIMETH 200-200-20 MG/5ML PO SUSP: 30.0000 mL | ORAL | Status: DC | PRN

## 2019-07-07 MED ORDER — PROPOFOL 10 MG/ML IV BOLUS
INTRAVENOUS | Status: AC
  Filled 2019-07-07: qty 40

## 2019-07-07 MED ORDER — OXYCODONE HCL 5 MG PO TABS: 5.0000 mg | ORAL_TABLET | Freq: Once | ORAL | Status: DC | PRN

## 2019-07-07 MED ORDER — SENNOSIDES-DOCUSATE SODIUM 8.6-50 MG PO TABS
1.0000 | ORAL_TABLET | Freq: Every evening | ORAL | Status: DC | PRN
  Administered 2019-07-07: 22:00:00 1 via ORAL
  Filled 2019-07-07: qty 1

## 2019-07-07 MED ORDER — VASOPRESSIN 20 UNIT/ML IV SOLN
INTRAVENOUS | Status: AC
  Filled 2019-07-07: qty 1

## 2019-07-07 MED ORDER — DEXMEDETOMIDINE HCL IN NACL 80 MCG/20ML IV SOLN
INTRAVENOUS | Status: AC
  Filled 2019-07-07: qty 20

## 2019-07-07 MED ORDER — ESMOLOL HCL 100 MG/10ML IV SOLN
INTRAVENOUS | Status: AC
  Filled 2019-07-07: qty 10

## 2019-07-07 MED ORDER — INSULIN ASPART 100 UNIT/ML ~~LOC~~ SOLN
SUBCUTANEOUS | Status: AC
  Administered 2019-07-07: 10:00:00 3 [IU] via SUBCUTANEOUS
  Filled 2019-07-07: qty 3

## 2019-07-07 MED ORDER — ONDANSETRON HCL 4 MG/2ML IJ SOLN: 4.0000 mg | Freq: Four times a day (QID) | INTRAMUSCULAR | Status: DC | PRN

## 2019-07-07 MED ORDER — SODIUM CHLORIDE 0.9 % IV SOLN: INTRAVENOUS | Status: DC

## 2019-07-07 MED ORDER — INSULIN ASPART 100 UNIT/ML ~~LOC~~ SOLN: 3.0000 [IU] | Freq: Once | SUBCUTANEOUS | Status: AC

## 2019-07-07 MED ORDER — LISINOPRIL 10 MG PO TABS
10.0000 mg | ORAL_TABLET | Freq: Every day | ORAL | Status: DC
  Administered 2019-07-08: 09:00:00 10 mg via ORAL
  Filled 2019-07-07 (×2): qty 1

## 2019-07-07 MED ORDER — CEFAZOLIN SODIUM-DEXTROSE 2-4 GM/100ML-% IV SOLN
2.0000 g | INTRAVENOUS | Status: AC
  Administered 2019-07-07: 2 g via INTRAVENOUS

## 2019-07-07 MED ORDER — FENTANYL CITRATE (PF) 100 MCG/2ML IJ SOLN
25.0000 ug | INTRAMUSCULAR | Status: DC | PRN
  Administered 2019-07-07 (×4): 25 ug via INTRAVENOUS

## 2019-07-07 MED ORDER — SUGAMMADEX SODIUM 500 MG/5ML IV SOLN
INTRAVENOUS | Status: DC | PRN
  Administered 2019-07-07: 380 mg via INTRAVENOUS

## 2019-07-07 SURGICAL SUPPLY — 76 items
BAG URINE DRAIN 2000ML AR STRL (UROLOGICAL SUPPLIES) ×4 IMPLANT
BLADE SURG 15 STRL LF DISP TIS (BLADE) ×2 IMPLANT
BLADE SURG 15 STRL SS (BLADE) ×2
BLADE SURG SZ10 CARB STEEL (BLADE) ×4 IMPLANT
BLADE SURG SZ11 CARB STEEL (BLADE) ×4 IMPLANT
CANISTER SUCT 1200ML W/VALVE (MISCELLANEOUS) ×4 IMPLANT
CATH FOLEY 2WAY  5CC 16FR (CATHETERS) ×2
CATH ROBINSON RED A/P 16FR (CATHETERS) IMPLANT
CATH URTH 16FR FL 2W BLN LF (CATHETERS) ×2 IMPLANT
CHLORAPREP W/TINT 26 (MISCELLANEOUS) ×4 IMPLANT
CLOSURE WOUND 1/2 X4 (GAUZE/BANDAGES/DRESSINGS)
CORD MONOPOLAR M/FML 12FT (MISCELLANEOUS) IMPLANT
COVER WAND RF STERILE (DRAPES) IMPLANT
DERMABOND ADVANCED (GAUZE/BANDAGES/DRESSINGS) ×2
DERMABOND ADVANCED .7 DNX12 (GAUZE/BANDAGES/DRESSINGS) ×2 IMPLANT
ELECT REM PT RETURN 9FT ADLT (ELECTROSURGICAL) ×4
ELECTRODE REM PT RTRN 9FT ADLT (ELECTROSURGICAL) ×2 IMPLANT
GAUZE 4X4 16PLY RFD (DISPOSABLE) ×4 IMPLANT
GAUZE PACK 2X3YD (GAUZE/BANDAGES/DRESSINGS) IMPLANT
GLOVE BIO SURGEON STRL SZ 6.5 (GLOVE) ×9 IMPLANT
GLOVE BIO SURGEON STRL SZ8 (GLOVE) ×8 IMPLANT
GLOVE BIO SURGEONS STRL SZ 6.5 (GLOVE) ×3
GLOVE BIOGEL PI ORTHO PRO 7.5 (GLOVE) ×4
GLOVE INDICATOR 7.0 STRL GRN (GLOVE) ×8 IMPLANT
GLOVE PI ORTHO PRO STRL 7.5 (GLOVE) ×4 IMPLANT
GOWN STRL REUS W/ TWL LRG LVL3 (GOWN DISPOSABLE) ×6 IMPLANT
GOWN STRL REUS W/TWL LRG LVL3 (GOWN DISPOSABLE) ×6
GOWN STRL REUS W/TWL XL LVL4 (GOWN DISPOSABLE) ×4 IMPLANT
GRASPER SUT TROCAR 14GX15 (MISCELLANEOUS) IMPLANT
HANDLE YANKAUER SUCT BULB TIP (MISCELLANEOUS) ×4 IMPLANT
IRRIGATION STRYKERFLOW (MISCELLANEOUS) ×2 IMPLANT
IRRIGATOR STRYKERFLOW (MISCELLANEOUS) ×4
IV LACTATED RINGERS 1000ML (IV SOLUTION) ×4 IMPLANT
KIT PINK PAD W/HEAD ARE REST (MISCELLANEOUS) ×4
KIT PINK PAD W/HEAD ARM REST (MISCELLANEOUS) ×2 IMPLANT
KIT TURNOVER CYSTO (KITS) ×4 IMPLANT
LABEL OR SOLS (LABEL) IMPLANT
LIGASURE VESSEL 5MM BLUNT TIP (ELECTROSURGICAL) IMPLANT
MANIPULATOR VCARE LG CRV RETR (MISCELLANEOUS) IMPLANT
MANIPULATOR VCARE SML CRV RETR (MISCELLANEOUS) IMPLANT
MANIPULATOR VCARE STD CRV RETR (MISCELLANEOUS) IMPLANT
NEEDLE SPNL 22GX3.5 QUINCKE BK (NEEDLE) ×4 IMPLANT
NS IRRIG 500ML POUR BTL (IV SOLUTION) ×4 IMPLANT
OCCLUDER COLPOPNEUMO (BALLOONS) IMPLANT
PACK BASIN MINOR ARMC (MISCELLANEOUS) ×4 IMPLANT
PACK GYN LAPAROSCOPIC (MISCELLANEOUS) ×4 IMPLANT
PAD OB MATERNITY 4.3X12.25 (PERSONAL CARE ITEMS) ×4 IMPLANT
PAD PREP 24X41 OB/GYN DISP (PERSONAL CARE ITEMS) ×4 IMPLANT
POUCH ENDO CATCH 10MM SPEC (MISCELLANEOUS) IMPLANT
POUCH SPECIMEN RETRIEVAL 10MM (ENDOMECHANICALS) IMPLANT
RETRACTOR YANK SUCT EIGR SABER (INSTRUMENTS) IMPLANT
SCISSORS METZENBAUM CVD 33 (INSTRUMENTS) ×4 IMPLANT
SET TUBE SMOKE EVAC HIGH FLOW (TUBING) ×4 IMPLANT
SHEARS HARMONIC ACE PLUS 36CM (ENDOMECHANICALS) ×4 IMPLANT
SLEEVE ENDOPATH XCEL 5M (ENDOMECHANICALS) ×8 IMPLANT
STRIP CLOSURE SKIN 1/2X4 (GAUZE/BANDAGES/DRESSINGS) IMPLANT
SUT MNCRL 4-0 (SUTURE) ×2
SUT MNCRL 4-0 27XMFL (SUTURE) ×2
SUT VIC AB 0 CT1 27 (SUTURE) ×2
SUT VIC AB 0 CT1 27XCR 8 STRN (SUTURE) ×2 IMPLANT
SUT VIC AB 0 CT1 36 (SUTURE) ×4 IMPLANT
SUT VIC AB 0 CT2 27 (SUTURE) ×4 IMPLANT
SUT VIC AB 2-0 CT1 (SUTURE) ×4 IMPLANT
SUT VIC AB 3-0 SH 27 (SUTURE)
SUT VIC AB 3-0 SH 27X BRD (SUTURE) IMPLANT
SUT VIC AB 4-0 FS2 27 (SUTURE) ×4 IMPLANT
SUT VICRYL 0 AB UR-6 (SUTURE) ×4 IMPLANT
SUTURE MNCRL 4-0 27XMF (SUTURE) ×2 IMPLANT
SYR 10ML LL (SYRINGE) ×4 IMPLANT
SYR 50ML LL SCALE MARK (SYRINGE) IMPLANT
SYR CONTROL 10ML LL (SYRINGE) ×4 IMPLANT
TAPE TRANSPORE STRL 2 31045 (GAUZE/BANDAGES/DRESSINGS) IMPLANT
TROCAR ENDO BLADELESS 11MM (ENDOMECHANICALS) ×4 IMPLANT
TROCAR XCEL NON-BLD 5MMX100MML (ENDOMECHANICALS) ×4 IMPLANT
TROCAR XCEL UNIV SLVE 11M 100M (ENDOMECHANICALS) ×4 IMPLANT
TUBING EVAC SMOKE HEATED PNEUM (TUBING) ×4 IMPLANT

## 2019-07-07 NOTE — Discharge Instructions (Signed)
Laparoscopically Assisted Vaginal Hysterectomy, Care After This sheet gives you information about how to care for yourself after your procedure. Your health care provider may also give you more specific instructions. If you have problems or questions, contact your health care provider. What can I expect after the procedure? After the procedure, it is common to have:  Soreness and numbness in your incision areas.  Abdominal pain. You will be given pain medicine to control it.  Vaginal bleeding and discharge. You will need to use a sanitary napkin after this procedure.  Sore throat from the breathing tube that was inserted during surgery. Follow these instructions at home: Medicines  Take over-the-counter and prescription medicines only as told by your health care provider.  Do not take aspirin or ibuprofen. These medicines can cause bleeding.  Do not drive or use heavy machinery while taking prescription pain medicine.  Do not drive for 24 hours if you were given a medicine to help you relax (sedative) during the procedure. Incision care   Follow instructions from your health care provider about how to take care of your incisions. Make sure you: ? Wash your hands with soap and water before you change your bandage (dressing). If soap and water are not available, use hand sanitizer. ? Change your dressing as told by your health care provider. ? Leave stitches (sutures), skin glue, or adhesive strips in place. These skin closures may need to stay in place for 2 weeks or longer. If adhesive strip edges start to loosen and curl up, you may trim the loose edges. Do not remove adhesive strips completely unless your health care provider tells you to do that.  Check your incision area every day for signs of infection. Check for: ? Redness, swelling, or pain. ? Fluid or blood. ? Warmth. ? Pus or a bad smell. Activity  Get regular exercise as told by your health care provider. You may be  told to take short walks every day and go farther each time.  Return to your normal activities as told by your health care provider. Ask your health care provider what activities are safe for you.  Do not douche, use tampons, or have sexual intercourse for at least 6 weeks, or until your health care provider gives you permission.  Do not lift anything that is heavier than 10 lb (4.5 kg), or the limit that your health care provider tells you, until he or she says that it is safe. General instructions  Do not take baths, swim, or use a hot tub until your health care provider approves. Take showers instead of baths.  Do not drive for 24 hours if you received a sedative.  Do not drive or operate heavy machinery while taking prescription pain medicine.  To prevent or treat constipation while you are taking prescription pain medicine, your health care provider may recommend that you: ? Drink enough fluid to keep your urine clear or pale yellow. ? Take over-the-counter or prescription medicines. ? Eat foods that are high in fiber, such as fresh fruits and vegetables, whole grains, and beans. ? Limit foods that are high in fat and processed sugars, such as fried and sweet foods.  Keep all follow-up visits as told by your health care provider. This is important. Contact a health care provider if:  You have signs of infection, such as: ? Redness, swelling, or pain around your incision sites. ? Fluid or blood coming from an incision. ? An incision that feels warm to the   touch. ? Pus or a bad smell coming from an incision.  Your incision breaks open.  Your pain medicine is not helping.  You feel dizzy or light-headed.  You have pain or bleeding when you urinate.  You have persistent nausea and vomiting.  You have blood, pus, or a bad-smelling discharge from your vagina. Get help right away if:  You have a fever.  You have severe abdominal pain.  You have chest pain.  You have  shortness of breath.  You faint.  You have pain, swelling, or redness in your leg.  You have heavy bleeding from your vagina. Summary  After the procedure, it is common to have abdominal pain and vaginal bleeding.  You should not drive or lift heavy objects until your health care provider says that it is safe.  Contact your health care provider if you have any symptoms of infection, excessive vaginal bleeding, nausea, vomiting, or shortness of breath. This information is not intended to replace advice given to you by your health care provider. Make sure you discuss any questions you have with your health care provider. Document Revised: 04/13/2017 Document Reviewed: 06/27/2016 Elsevier Patient Education  2020 [employer]  

## 2019-07-07 NOTE — Interval H&P Note (Signed)
History and Physical Interval Note:  07/07/2019 7:14 AM  Sherry Hobbs  has presented today for surgery, with the diagnosis of Perimenopausal menorrhagia, history of failed medical management and prior endometrial ablation.  The various methods of treatment have been discussed with the patient and family. After consideration of risks, benefits and other options for treatment, the patient has consented to  Procedure(s) with comments: [place] (N/A) - possible TVH, LAPAROSCOPIC BILATERAL SALPINGECTOMY (Bilateral) as a surgical intervention.  The patient's history has been reviewed, patient examined, no change in status, stable for surgery.  I have reviewed the patient's chart and labs.  Questions were answered to the patient's satisfaction.     [name], MD [facility]

## 2019-07-07 NOTE — Anesthesia Procedure Notes (Signed)
Procedure Name: Intubation Performed by: Kelton Pillar, CRNA Pre-anesthesia Checklist: Patient identified, Emergency Drugs available, Suction available and Patient being monitored Patient Re-evaluated:Patient Re-evaluated prior to induction Oxygen Delivery Method: Circle system utilized Preoxygenation: Pre-oxygenation with 100% oxygen Induction Type: IV induction Ventilation: Mask ventilation without difficulty Laryngoscope Size: McGraph and 4 Grade View: Grade I Tube type: Oral Tube size: 6.5 mm Number of attempts: 1 Airway Equipment and Method: Stylet and Video-laryngoscopy Placement Confirmation: ETT inserted through vocal cords under direct vision,  positive ETCO2,  CO2 detector and breath sounds checked- equal and bilateral Secured at: 21 cm Tube secured with: Tape Dental Injury: Teeth and Oropharynx as per pre-operative assessment  Comments: Mcgraph used for dental protection -TFH

## 2019-07-07 NOTE — Addendum Note (Signed)
Addendum  created 07/07/19 1227 by Kelton Pillar, CRNA   Charge Capture section accepted

## 2019-07-07 NOTE — Op Note (Signed)
Procedure(s): LAPAROSCOPIC ASSISTED VAGINAL HYSTERECTOMY LAPAROSCOPIC BILATERAL SALPINGECTOMY Procedure Note  Sherry Hobbs female 49 y.o. 07/07/2019  Indications: The patient is a 49 y.o. G71P1001 female with menorrhagia, anemia, s/p failed medical management and history of prior endometrial ablation  Pre-operative Diagnosis: 1. Menorrhagia.  2. Anemia (iron deficiency)  3. S/p failed medical management  4. History of endometrial ablation. 5. Morbid obesity  Post-operative Diagnosis: Same  Surgeon: [name], MD  Assistants:  Jeannie Fend, MD. An experienced assistant was required given the standard of surgical care given the complexity of the case.  This assistant was needed for exposure, dissection, suctioning, retraction, instrument exchange, and for overall help during the procedure.  Anesthesia: General endotracheal anesthesia  Findings: The uterus appeared boggy, bulbous.  Fallopian tubes and ovaries appeared normal.   Procedure Details: The patient was seen in the Holding Room. The risks, benefits, complications, treatment options, and expected outcomes were discussed with the patient.  The patient concurred with the proposed plan, giving informed consent.  The site of surgery properly noted/marked. The patient was taken to the Operating Room, identified as Sherry Hobbs and the procedure verified as Procedure(s) (LRB): LAPAROSCOPIC ASSISTED VAGINAL HYSTERECTOMY (N/A), BILATERAL SALPINGECTOMY (Bilateral). A Time Out was held and the above information confirmed.  The patient received intravenous antibiotics and had sequential compression devices applied to her lower extremities while in the preoperative area.  She was then taken to the operating room where general anesthesia was administered and was found to be adequate.  She was placed in the dorsal lithotomy position, and was prepped and draped in a sterile manner.  After an adequate timeout was performed, a Foley catheter  was inserted into her bladder, allowed to drain, and then clamped.  A sterile speculum was inserted into the vagina and the cervix was grasped with a single-toothed tenaculum.  A Hulka clamp was placed for uterine manipulation.    Attention was turned to the abdomen where an umbilical incision was made with the scalpel.  The Optiview 5-mm trocar and sleeve were then advanced without difficulty with the laparoscope under direct visualization into the abdomen.  The abdomen was then insufflated with carbon dioxide gas and adequate pneumoperitoneum was obtained.  Bilateral 5-mm lower quadrant ports were then placed under direct visualization.  A survey of the patient's pelvis and abdomen revealed the findings as above.  Attention was turned to fallopian tube on the patient's right side, which was clamped and separated from the mesosalpinx using the Harmonic scalpel and ligated.  The round and broad ligaments were then clamped and transected with the Harmonic scalpel.  The ureter were noted to be safely away from the area of dissection.  The uterine artery was then skeletonized and a bladder flap was created.  The bladder was then bluntly dissected off the lower uterine segment.  At this point, attention was turned to the right uterine vessels, which were clamped and ligated using the Harmonic scalpel.  Attention was then turned to the patient's left side, which was treated in a similar manner by taking the left fallopian tube, the round ligament and the broad ligaments and the bladder flap creation was completed. The left uterine vessels were also clamped and transected in a similar fashion. The decision was made to leave the trocars in place and proceed with completing the hysterectomy via the vaginal route.  Attention was then turned to her pelvis.  A weighted speculum was then placed in the vagina, and the anterior and posterior  lips of the cervix were grasped bilaterally with tenaculums.  The cervix was then  circumferentially incised, and the posterior cul-de-sac was entered sharply without difficulty.  A long weighted speculum was inserted into the posterior cul-de-sac. The Heaney clamp was then used to clamp the uterosacral ligaments on either side.  They were then cut and sutured ligated with 0 Vicryl, and were held with a tag for later identification. Of note, all sutures used in this case were 0 Vicryl unless otherwise noted.   The cardinal ligaments were then clamped, cut and ligated bilaterally.  At this point, full entry into the anterior cul-de-sac was made, no injury to the bladder was noted.  The uterus and tubes were freed from all ligaments and was then delivered and sent to pathology.  The peritoneum was then reefed using a 0-Vicryl in a purse-string suture fashion. A modified McCall's culdoplasty was performed using a 0-Vicryl, incorporating the vaginal cuff angles and the posterior vaginal cuff. The vaginal cuff was then closed in a running locked fashion with 0 Vicryl with care given to incorporate the uterosacral pedicles bilaterally.  All instruments were then removed from the pelvis.  Attention was then returned to her abdomen which was insufflated again with carbon dioxide gas.  The laparoscope was used to survey the operative site,  No intraoperative injury to other surrounding organs was noted.  The ovaries appeared hemostatic bilaterally.  The abdomen was then cleared of all clots using the suction irrigator. The abdomen was desufflated and all instruments were then removed from the patient's abdomen.   All skin incisions were closed with 4-0 Monocryl and Dermabond. The skin was injected with 7 ml of 0.5 % Sensorcaine. The patient tolerated the procedures well.  All instruments, needles, and sponge counts were correct  times three. The patient was taken to the recovery room awake, extubated and in stable condition.   Estimated Blood Loss:  300 ml      Drains: straight catheterization prior  to procedure and after the procedure with  650 ml of clear urine         Total IV Fluids:  1100 ml  Specimens: Uterus with bilateral fallopian tubes         Implants: None         Complications:  None; patient tolerated the procedure well.         Disposition: PACU - hemodynamically stable.         Condition: stable   [name], MD [facility]

## 2019-07-07 NOTE — Transfer of Care (Signed)
Immediate Anesthesia Transfer of Care Note  Patient: Sherry Hobbs  Procedure(s) Performed: LAPAROSCOPIC ASSISTED VAGINAL HYSTERECTOMY (N/A ) LAPAROSCOPIC BILATERAL SALPINGECTOMY (Bilateral )  Patient Location: PACU  Anesthesia Type:General  Level of Consciousness: drowsy, patient cooperative and responds to stimulation  Airway & Oxygen Therapy: Patient Spontanous Breathing and Patient connected to face mask oxygen  Post-op Assessment: Report given to RN and Post -op Vital signs reviewed and stable  Post vital signs: Reviewed and stable  Last Vitals:  Vitals Value Taken Time  BP 128/68 07/07/19 1007  Temp    Pulse 92 07/07/19 1009  Resp 30 07/07/19 1009  SpO2 99 % 07/07/19 1009  Vitals shown include unvalidated device data.  Last Pain:  Vitals:   07/07/19 0608  TempSrc: Tympanic  PainSc: 0-No pain         Complications: No apparent anesthesia complications

## 2019-07-07 NOTE — Anesthesia Postprocedure Evaluation (Signed)
Anesthesia Post Note  Patient: BERTHAMAE KONDO  Procedure(s) Performed: LAPAROSCOPIC ASSISTED VAGINAL HYSTERECTOMY (N/A ) LAPAROSCOPIC BILATERAL SALPINGECTOMY (Bilateral )  Patient location during evaluation: PACU Anesthesia Type: General Level of consciousness: awake and alert and oriented Pain management: pain level controlled Vital Signs Assessment: post-procedure vital signs reviewed and stable Respiratory status: spontaneous breathing, nonlabored ventilation and respiratory function stable Cardiovascular status: blood pressure returned to baseline and stable Postop Assessment: no signs of nausea or vomiting Anesthetic complications: no     Last Vitals:  Vitals:   07/07/19 1110 07/07/19 1115  BP: 109/61   Pulse: 88 93  Resp: (!) 28 (!) 26  Temp:    SpO2: 94% 96%    Last Pain:  Vitals:   07/07/19 1100  TempSrc:   PainSc: 1                  Adam Demary

## 2019-07-07 NOTE — Anesthesia Preprocedure Evaluation (Signed)
Anesthesia Evaluation  Patient identified by MRN, date of birth, ID band Patient awake    Reviewed: Allergy & Precautions, NPO status , Patient's Chart, lab work & pertinent test results  History of Anesthesia Complications Negative for: history of anesthetic complications  Airway Mallampati: II  TM Distance: >3 FB Neck ROM: Full    Dental  (+) Poor Dentition,    Pulmonary neg pulmonary ROS, neg sleep apnea, neg COPD,    breath sounds clear to auscultation- rhonchi (-) wheezing      Cardiovascular hypertension, Pt. on medications (-) CAD, (-) Past MI, (-) Cardiac Stents and (-) CABG  Rhythm:Regular Rate:Normal - Systolic murmurs and - Diastolic murmurs    Neuro/Psych  Headaches, neg Seizures PSYCHIATRIC DISORDERS Depression    GI/Hepatic Neg liver ROS, GERD  ,  Endo/Other  diabetes, Oral Hypoglycemic Agents  Renal/GU negative Renal ROS     Musculoskeletal  (+) Arthritis ,   Abdominal (+) + obese,   Peds  Hematology  (+) anemia ,   Anesthesia Other Findings Past Medical History: No date: Anemia     Comment:  problems when having abnmornal uterine bleeding. No date: Arthritis     Comment:  lower back No date: Depression No date: Diabetes mellitus without complication (HCC) No date: Elevated serum creatinine No date: GERD (gastroesophageal reflux disease)     Comment:  OCC  NO MEDS No date: Headache     Comment:  occasional migraines 05/02/2016: History of blood transfusion     Comment:  due to abnormal uterine bleeding No date: Hypertension No date: Neuropathy     Comment:  feet   Reproductive/Obstetrics                             Anesthesia Physical Anesthesia Plan  ASA: III  Anesthesia Plan: General   Post-op Pain Management:    Induction: Intravenous  PONV Risk Score and Plan: 2 and Ondansetron, Dexamethasone and Midazolam  Airway Management Planned: Oral  ETT  Additional Equipment:   Intra-op Plan:   Post-operative Plan: Extubation in OR  Informed Consent: I have reviewed the patients History and Physical, chart, labs and discussed the procedure including the risks, benefits and alternatives for the proposed anesthesia with the patient or authorized representative who has indicated his/her understanding and acceptance.     Dental advisory given  Plan Discussed with: CRNA and Anesthesiologist  Anesthesia Plan Comments:         Anesthesia Quick Evaluation

## 2019-07-08 DIAGNOSIS — N924 Excessive bleeding in the premenopausal period: Secondary | ICD-10-CM | POA: Diagnosis not present

## 2019-07-08 LAB — GLUCOSE, CAPILLARY
Glucose-Capillary: 178 mg/dL — ABNORMAL HIGH (ref 70–99)
Glucose-Capillary: 228 mg/dL — ABNORMAL HIGH (ref 70–99)

## 2019-07-08 LAB — HEMOGLOBIN A1C
Hgb A1c MFr Bld: 5.6 % (ref 4.8–5.6)
Mean Plasma Glucose: 114.02 mg/dL

## 2019-07-08 LAB — CBC
HCT: 24.2 % — ABNORMAL LOW (ref 36.0–46.0)
Hemoglobin: 7.9 g/dL — ABNORMAL LOW (ref 12.0–15.0)
MCH: 28.6 pg (ref 26.0–34.0)
MCHC: 32.6 g/dL (ref 30.0–36.0)
MCV: 87.7 fL (ref 80.0–100.0)
Platelets: 262 10*3/uL (ref 150–400)
RBC: 2.76 MIL/uL — ABNORMAL LOW (ref 3.87–5.11)
RDW: 16.6 % — ABNORMAL HIGH (ref 11.5–15.5)
WBC: 12.1 10*3/uL — ABNORMAL HIGH (ref 4.0–10.5)
nRBC: 0 % (ref 0.0–0.2)

## 2019-07-08 LAB — CREATININE, SERUM
Creatinine, Ser: 1.17 mg/dL — ABNORMAL HIGH (ref 0.44–1.00)
GFR calc Af Amer: >60 mL/min (ref >60)
GFR calc non Af Amer: 56 mL/min — ABNORMAL LOW (ref >60)

## 2019-07-08 LAB — SURGICAL PATHOLOGY

## 2019-07-08 MED ORDER — OXYCODONE-ACETAMINOPHEN 5-325 MG PO TABS: 1.0000 | ORAL_TABLET | Freq: Four times a day (QID) | ORAL | 0 refills | Status: DC | PRN

## 2019-07-08 MED ORDER — IBUPROFEN 800 MG PO TABS: 800.0000 mg | ORAL_TABLET | Freq: Three times a day (TID) | ORAL | 0 refills | Status: DC | PRN

## 2019-07-08 MED ORDER — FERROUS SULFATE 325 (65 FE) MG PO TABS: 325.0000 mg | ORAL_TABLET | Freq: Two times a day (BID) | ORAL | 1 refills | Status: DC

## 2019-07-08 MED ORDER — DOCUSATE SODIUM 100 MG PO CAPS: 100.0000 mg | ORAL_CAPSULE | Freq: Two times a day (BID) | ORAL | 2 refills | Status: DC | PRN

## 2019-07-08 MED ORDER — SODIUM CHLORIDE 0.9 % IV SOLN
510.0000 mg | Freq: Once | INTRAVENOUS | Status: AC
  Administered 2019-07-08: 11:00:00 510 mg via INTRAVENOUS
  Filled 2019-07-08: qty 17

## 2019-07-08 NOTE — Discharge Summary (Signed)
Gynecology Physician Postoperative Discharge Summary  Patient ID: Sherry Hobbs MRN: [id number] DOB/AGE: 47-Oct-1972 47 y.o.  Admit Date: 07/07/2019 Discharge Date: 07/08/2019  Preoperative Diagnoses: DUB (dysfunctional uterine bleeding)  Iron deficiency anemia due to chronic blood loss  Type 2 diabetes mellitus without complication, without long-term current use of insulin ([place])  Essential hypertension    Procedures: Procedure(s) (LRB): LAPAROSCOPIC ASSISTED VAGINAL HYSTERECTOMY (N/A) LAPAROSCOPIC BILATERAL SALPINGECTOMY (Bilateral)  Hospital Course:  Sherry Hobbs is a 49 y.o. G1P1001 admitted for scheduled surgery.  She underwent the procedures as mentioned above, her operation was uncomplicated. For further details about surgery, please refer to the operative report. Patient had an uncomplicated postoperative course. By time of discharge on POD#1, her pain was controlled on oral pain medications; she was ambulating, voiding without difficulty, tolerating regular diet and passing flatus. She received an IV iron infusion of Feraheme 510 mg prior to discharge for management of anemia. She was deemed stable for discharge to home.   Significant Labs: CBC Latest Ref Rng & Units 07/08/2019 07/03/2019 06/07/2018  WBC 4.0 - 10.5 K/uL 12.1(H) 10.4 10.9(H)  Hemoglobin 12.0 - 15.0 g/dL 7.9(L) 9.7(L) 12.1  Hematocrit 36.0 - 46.0 % 24.2(L) 29.8(L) 35.9(L)  Platelets 150 - 400 K/uL 262 298 287    Discharge Exam: Blood pressure 125/62, pulse 77, temperature 98.2 F (36.8 C), temperature source Oral, resp. rate 18, SpO2 100 %. General appearance: alert and no distress  Resp: clear to auscultation bilaterally  Cardio: regular rate and rhythm  GI: soft, non-tender; bowel sounds normal; no masses, no organomegaly.  Incision: C/D/I, no erythema, no drainage noted Pelvic: scant blood on pad  Extremities: extremities normal, atraumatic, no cyanosis or edema and Homans sign is negative, no sign of  DVT  Discharged Condition: Stable  Disposition: Discharge disposition: 01-Home or Self Care       Discharge Instructions    Discharge patient   Complete by: As directed    Discharge disposition: 01-Home or Self Care   Discharge patient date: 07/07/2019     Allergies as of 07/08/2019      Reactions   Codeine Nausea And Vomiting      Medication List    TAKE these medications   docusate sodium 100 MG capsule Commonly known as: COLACE Take 1 capsule (100 mg total) by mouth 2 (two) times daily as needed.   ferrous sulfate 325 (65 FE) MG tablet Commonly known as: FerrouSul Take 1 tablet (325 mg total) by mouth 2 (two) times daily.   ibuprofen 800 MG tablet Commonly known as: ADVIL Take 1 tablet (800 mg total) by mouth every 8 (eight) hours as needed.   lisinopril 10 MG tablet Commonly known as: ZESTRIL Take 10 mg by mouth every morning.   metFORMIN 500 MG tablet Commonly known as: GLUCOPHAGE Take 1,000 mg by mouth daily with breakfast.   methocarbamol 750 MG tablet Commonly known as: ROBAXIN Take 1 tablet (750 mg total) by mouth every 8 (eight) hours as needed for muscle spasms.   oxyCODONE-acetaminophen 5-325 MG tablet Commonly known as: PERCOCET/ROXICET Take 1-2 tablets by mouth every 6 (six) hours as needed for moderate pain.   pregabalin 100 MG capsule Commonly known as: Lyrica Take 1 capsule (100 mg total) by mouth 2 (two) times daily.      Future Appointments  Date Time Provider [place]  07/15/2019  3:30 PM [name], MD [facility] None  09/18/2019  8:15 AM Gillis Santa, MD [facility] None   Follow-up Information  [name], MD Follow up in 1 week(s).   Specialties: Obstetrics and Gynecology, Radiology Why: For wound re-check  Tuesday 07/15/2019 @ 3:30 p.m. Contact information: [place] [address] [phone]           Signed:  [name], MD [facility] 07/08/2019 9:24 AM

## 2019-07-08 NOTE — Plan of Care (Signed)
Pain controled with medications. Patient up independently, ambulated around nursing station without shortness of breath or difficulty. Patient educated on signs/symptoms of infection. Patient currently on insulin for glucose control, patient states understanding and agrees with treatment plan. Will continue with plan of care.

## 2019-07-08 NOTE — Progress Notes (Signed)
Pt ate breakfast during shift change this morning and her blood glucose was not obtained.  Blood glucose was taken after eating and SS insulin was held.  Dr. Marcelline Mates informed on morning rounds.  Dose of Metformin was given.

## 2019-07-08 NOTE — Progress Notes (Signed)
Post-Operative Day # 1, s/p LAVH with bilateral salpingectomy. PMH of HTN, DM, and anemia.   Subjective: no complaints, up ad lib, voiding, tolerating PO and + flatus  Objective: Temp:  [97 F (36.1 C)-99.1 F (37.3 C)] 98.2 F (36.8 C) (02/23 0805) Pulse Rate:  [59-100] 77 (02/23 0805) Resp:  [14-30] 18 (02/23 0805) BP: (94-146)/(52-77) 125/62 (02/23 0805) SpO2:  [92 %-100 %] 100 % (02/23 0805)  I/O last 3 completed shifts: In: [id number] [P.O.:360; I.V.:1100] Out: 950 [Urine:850; Blood:100] No intake/output data recorded.   Physical Exam:  General: alert and no distress  Lungs: clear to auscultation bilaterally Heart: regular rate and rhythm, S1, S2 normal, no murmur, click, rub or gallop Abdomen: soft, non-tender; bowel sounds normal; no masses,  no organomegaly Pelvis:Bleeding: appropriate,  Incision: healing well, no significant drainage, no dehiscence, no significant erythema Extremities: DVT Evaluation: No evidence of DVT seen on physical exam. Negative Homan's sign. No cords or calf tenderness. No significant calf/ankle edema.   CBC Latest Ref Rng & Units 07/08/2019 07/03/2019 06/07/2018  WBC 4.0 - 10.5 K/uL 12.1(H) 10.4 10.9(H)  Hemoglobin 12.0 - 15.0 g/dL 7.9(L) 9.7(L) 12.1  Hematocrit 36.0 - 46.0 % 24.2(L) 29.8(L) 35.9(L)  Platelets 150 - 400 K/uL 262 298 287      Lab Results  Component Value Date   CREATININE 1.17 (H) 07/08/2019   CREATININE 1.07 (H) 07/03/2019     Assessment/Plan: Post-operatively dong well.  Continue routine post-operative care Continue to encourage ambulation To resume home meds this morning (Metformin and Lisinopril).  Discussion had on patient's anemia (worsened by recent surgical blood loss).  Overall asymptomatic. Does not warrant a blood transfusion at this time. Discussed option of iron infusion while inpatient.  Patient ok with plan, and follow up with PO supplementation once discharged.  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  Can discharge home today.  Can discharge home today.     [name], MD [facility]

## 2019-07-08 NOTE — Progress Notes (Signed)
DC inst reviewed with pt.  Verb u/o of home meds and f/u appt.  DC to home escorted to car via auxillary.

## 2019-07-15 ENCOUNTER — Other Ambulatory Visit: Payer: Self-pay

## 2019-07-15 ENCOUNTER — Ambulatory Visit (INDEPENDENT_AMBULATORY_CARE_PROVIDER_SITE_OTHER): Payer: Managed Care, Other (non HMO) | Admitting: Obstetrics and Gynecology

## 2019-07-15 ENCOUNTER — Encounter: Payer: Self-pay | Admitting: Obstetrics and Gynecology

## 2019-07-15 VITALS — BP 153/79 | HR 89 | Ht 62.0 in | Wt 189.7 lb

## 2019-07-15 DIAGNOSIS — Z09 Encounter for follow-up examination after completed treatment for conditions other than malignant neoplasm: Secondary | ICD-10-CM

## 2019-07-15 NOTE — Progress Notes (Signed)
    OBSTETRICS/GYNECOLOGY POST-OPERATIVE CLINIC VISIT  Subjective:     Sherry Hobbs is a 49 y.o. female who presents to the clinic 1 weeks status post laparoscopic assisted vaginal hysterectomy for abnormal uterine bleeding. Also received iron infusion inpatient for h/o anemia. Eating a regular diet without difficulty. Bowel movements are normal. Pain is controlled without any medications.  Feels like she has more energy now.   The following portions of the patient's history were reviewed and updated as appropriate: allergies, current medications, past family history, past medical history, past social history, past surgical history and problem list.  Review of Systems Pertinent items noted in HPI and remainder of comprehensive ROS otherwise negative.    Objective:    BP (!) 153/79   Pulse 89   Ht 5\' 2"  (1.575 m)   Wt 189 lb 11.2 oz (86 kg)   LMP 11/26/2017   BMI 34.70 kg/m  General:  alert and no distress  Abdomen: soft, bowel sounds active, non-tender  Incision:   healing well, no drainage, no erythema, no hernia, no seroma, no swelling, no dehiscence, incision well approximated    Pathology:  DIAGNOSIS:  A. UTERUS, CERVIX, AND BILATERAL FALLOPIAN TUBES; TOTAL HYSTERECTOMY  WITH BILATERAL SALPINGECTOMY:  - ENDOMETRIUM:    - CHANGES CONSISTENT WITH PROGESTIN EFFECT.    - NEGATIVE FOR ATYPIA / EIN AND MALIGNANCY.  - CERVIX:    - NEGATIVE FOR DYSPLASIA AND MALIGNANCY.  - MYOMETRIUM AND SEROSA:    - NO SIGNIFICANT PATHOLOGIC ALTERATION.  - BILATERAL FALLOPIAN TUBES:    - NEGATIVE FOR ATYPIA AND MALIGNANCY.    Assessment:   S/p LAVH with bilateral slapingectomy Doing well postoperatively.   Plan:   1. Wound care discussed. 2. Operative findings again reviewed. Pathology report discussed. 3. Activity restrictions: no bending, stooping, or squatting and no lifting more than 10-15 pounds 4. Anticipated return to work: 5 weeks. 6. Follow up: 5 weeks for  final post-op check.     [name], MD [facility]

## 2019-07-15 NOTE — Progress Notes (Signed)
Pt present for post op check after hysterectomy. Pt stated that she was doing well no problems.

## 2019-07-30 ENCOUNTER — Other Ambulatory Visit: Payer: Self-pay | Admitting: Obstetrics and Gynecology

## 2019-08-06 ENCOUNTER — Ambulatory Visit: Payer: Managed Care, Other (non HMO) | Admitting: Nurse Practitioner

## 2019-08-07 ENCOUNTER — Ambulatory Visit: Payer: Managed Care, Other (non HMO) | Admitting: Nurse Practitioner

## 2019-08-07 ENCOUNTER — Other Ambulatory Visit: Payer: Self-pay

## 2019-08-07 ENCOUNTER — Encounter: Payer: Self-pay | Admitting: Nurse Practitioner

## 2019-08-07 VITALS — BP 115/70 | HR 72 | Temp 96.3°F | Ht 62.0 in | Wt 188.8 lb

## 2019-08-07 DIAGNOSIS — Z8379 Family history of other diseases of the digestive system: Secondary | ICD-10-CM

## 2019-08-07 DIAGNOSIS — E119 Type 2 diabetes mellitus without complications: Secondary | ICD-10-CM

## 2019-08-07 DIAGNOSIS — D508 Other iron deficiency anemias: Secondary | ICD-10-CM

## 2019-08-07 DIAGNOSIS — Z23 Encounter for immunization: Secondary | ICD-10-CM

## 2019-08-07 DIAGNOSIS — K219 Gastro-esophageal reflux disease without esophagitis: Secondary | ICD-10-CM

## 2019-08-07 DIAGNOSIS — R21 Rash and other nonspecific skin eruption: Secondary | ICD-10-CM

## 2019-08-07 DIAGNOSIS — Z0000 Encounter for general adult medical examination without abnormal findings: Secondary | ICD-10-CM

## 2019-08-07 DIAGNOSIS — G629 Polyneuropathy, unspecified: Secondary | ICD-10-CM

## 2019-08-07 DIAGNOSIS — I10 Essential (primary) hypertension: Secondary | ICD-10-CM

## 2019-08-07 HISTORY — DX: Essential (primary) hypertension: I10

## 2019-08-07 HISTORY — DX: Type 2 diabetes mellitus without complications: E11.9

## 2019-08-07 LAB — CBC WITH DIFFERENTIAL/PLATELET
Basophils Absolute: 0 10*3/uL (ref 0.0–0.1)
Basophils Relative: 0.5 % (ref 0.0–3.0)
Eosinophils Absolute: 0.2 10*3/uL (ref 0.0–0.7)
Eosinophils Relative: 2 % (ref 0.0–5.0)
HCT: 36.4 % (ref 36.0–46.0)
Hemoglobin: 12.2 g/dL (ref 12.0–15.0)
Lymphocytes Relative: 22.5 % (ref 12.0–46.0)
Lymphs Abs: 1.8 10*3/uL (ref 0.7–4.0)
MCHC: 33.4 g/dL (ref 30.0–36.0)
MCV: 85.7 fl (ref 78.0–100.0)
Monocytes Absolute: 0.5 10*3/uL (ref 0.1–1.0)
Monocytes Relative: 6.7 % (ref 3.0–12.0)
Neutro Abs: 5.5 10*3/uL (ref 1.4–7.7)
Neutrophils Relative %: 68.3 % (ref 43.0–77.0)
Platelets: 241 10*3/uL (ref 150.0–400.0)
RBC: 4.25 Mil/uL (ref 3.87–5.11)
RDW: 16 % — ABNORMAL HIGH (ref 11.5–15.5)
WBC: 8 10*3/uL (ref 4.0–10.5)

## 2019-08-07 LAB — TSH: TSH: 2.48 u[IU]/mL (ref 0.35–4.50)

## 2019-08-07 LAB — COMPREHENSIVE METABOLIC PANEL
ALT: 29 U/L (ref 0–35)
AST: 22 U/L (ref 0–37)
Albumin: 4.1 g/dL (ref 3.5–5.2)
Alkaline Phosphatase: 90 U/L (ref 39–117)
BUN: 13 mg/dL (ref 6–23)
CO2: 26 mEq/L (ref 19–32)
Calcium: 9.6 mg/dL (ref 8.4–10.5)
Chloride: 104 mEq/L (ref 96–112)
Creatinine, Ser: 1.1 mg/dL (ref 0.40–1.20)
GFR: 53.4 mL/min — ABNORMAL LOW (ref >60.00)
Glucose, Bld: 147 mg/dL — ABNORMAL HIGH (ref 70–99)
Potassium: 4.2 mEq/L (ref 3.5–5.1)
Sodium: 137 mEq/L (ref 135–145)
Total Bilirubin: 0.5 mg/dL (ref 0.2–1.2)
Total Protein: 6.9 g/dL (ref 6.0–8.3)

## 2019-08-07 LAB — IBC + FERRITIN
Ferritin: 68.1 ng/mL (ref 10.0–291.0)
Iron: 63 ug/dL (ref 42–145)
Saturation Ratios: 18.3 % — ABNORMAL LOW (ref 20.0–50.0)
Transferrin: 246 mg/dL (ref 212.0–360.0)

## 2019-08-07 LAB — MICROALBUMIN / CREATININE URINE RATIO
Creatinine,U: 88 mg/dL
Microalb Creat Ratio: 0.8 mg/g (ref 0.0–30.0)
Microalb, Ur: <0.7 mg/dL (ref 0.0–1.9)

## 2019-08-07 LAB — B12 AND FOLATE PANEL
Folate: 16.7 ng/mL (ref >5.9)
Vitamin B-12: 139 pg/mL — ABNORMAL LOW (ref 211–911)

## 2019-08-07 MED ORDER — CYANOCOBALAMIN 1000 MCG/ML IJ SOLN: INTRAMUSCULAR | 15 refills | Status: DC

## 2019-08-07 NOTE — Patient Instructions (Addendum)
It was wonderful to meet you today.   Please go to the lab today.   Please check your blood sugar fasting in the morning and 2 hours after supper for a week and 2 hours after lunch for a week. Please bring those in.   Follow up in 2 mos.   I will give you a referral to NEUROLOGY for your rt foot.   I will give you a referral to DERMATOLOGY for your persistent skin rash.   Healthy diet with weight loss goal.   Diabetes Mellitus and Nutrition, Adult When you have diabetes (diabetes mellitus), it is very important to have healthy eating habits because your blood sugar (glucose) levels are greatly affected by what you eat and drink. Eating healthy foods in the appropriate amounts, at about the same times every day, can help you:  Control your blood glucose.  Lower your risk of heart disease.  Improve your blood pressure.  Reach or maintain a healthy weight. Every person with diabetes is different, and each person has different needs for a meal plan. Your health care provider may recommend that you work with a diet and nutrition specialist (dietitian) to make a meal plan that is best for you. Your meal plan may vary depending on factors such as:  The calories you need.  The medicines you take.  Your weight.  Your blood glucose, blood pressure, and cholesterol levels.  Your activity level.  Other health conditions you have, such as heart or kidney disease. How do carbohydrates affect me? Carbohydrates, also called carbs, affect your blood glucose level more than any other type of food. Eating carbs naturally raises the amount of glucose in your blood. Carb counting is a method for keeping track of how many carbs you eat. Counting carbs is important to keep your blood glucose at a healthy level, especially if you use insulin or take certain oral diabetes medicines. It is important to know how many carbs you can safely have in each meal. This is different for every person. Your  dietitian can help you calculate how many carbs you should have at each meal and for each snack. Foods that contain carbs include:  Bread, cereal, rice, pasta, and crackers.  Potatoes and corn.  Peas, beans, and lentils.  Milk and yogurt.  Fruit and juice.  Desserts, such as cakes, cookies, ice cream, and candy. How does alcohol affect me? Alcohol can cause a sudden decrease in blood glucose (hypoglycemia), especially if you use insulin or take certain oral diabetes medicines. Hypoglycemia can be a life-threatening condition. Symptoms of hypoglycemia (sleepiness, dizziness, and confusion) are similar to symptoms of having too much alcohol. If your health care provider says that alcohol is safe for you, follow these guidelines:  Limit alcohol intake to no more than 1 drink per day for nonpregnant women and 2 drinks per day for men. One drink equals 12 oz of beer, 5 oz of wine, or 1 oz of hard liquor.  Do not drink on an empty stomach.  Keep yourself hydrated with water, diet soda, or unsweetened iced tea.  Keep in mind that regular soda, juice, and other mixers may contain a lot of sugar and must be counted as carbs. What are tips for following this plan?  Reading food labels  Start by checking the serving size on the "Nutrition Facts" label of packaged foods and drinks. The amount of calories, carbs, fats, and other nutrients listed on the label is based on one serving of  the item. Many items contain more than one serving per package.  Check the total grams (g) of carbs in one serving. You can calculate the number of servings of carbs in one serving by dividing the total carbs by 15. For example, if a food has 30 g of total carbs, it would be equal to 2 servings of carbs.  Check the number of grams (g) of saturated and trans fats in one serving. Choose foods that have low or no amount of these fats.  Check the number of milligrams (mg) of salt (sodium) in one serving. Most people  should limit total sodium intake to less than 2,300 mg per day.  Always check the nutrition information of foods labeled as "low-fat" or "nonfat". These foods may be higher in added sugar or refined carbs and should be avoided.  Talk to your dietitian to identify your daily goals for nutrients listed on the label. Shopping  Avoid buying canned, premade, or processed foods. These foods tend to be high in fat, sodium, and added sugar.  Shop around the outside edge of the grocery store. This includes fresh fruits and vegetables, bulk grains, fresh meats, and fresh dairy. Cooking  Use low-heat cooking methods, such as baking, instead of high-heat cooking methods like deep frying.  Cook using healthy oils, such as olive, canola, or sunflower oil.  Avoid cooking with butter, cream, or high-fat meats. Meal planning  Eat meals and snacks regularly, preferably at the same times every day. Avoid going long periods of time without eating.  Eat foods high in fiber, such as fresh fruits, vegetables, beans, and whole grains. Talk to your dietitian about how many servings of carbs you can eat at each meal.  Eat 4-6 ounces (oz) of lean protein each day, such as lean meat, chicken, fish, eggs, or tofu. One oz of lean protein is equal to: ? 1 oz of meat, chicken, or fish. ? 1 egg. ?  cup of tofu.  Eat some foods each day that contain healthy fats, such as avocado, nuts, seeds, and fish. Lifestyle  Check your blood glucose regularly.  Exercise regularly as told by your health care provider. This may include: ? 150 minutes of moderate-intensity or vigorous-intensity exercise each week. This could be brisk walking, biking, or water aerobics. ? Stretching and doing strength exercises, such as yoga or weightlifting, at least 2 times a week.  Take medicines as told by your health care provider.  Do not use any products that contain nicotine or tobacco, such as cigarettes and e-cigarettes. If you need  help quitting, ask your health care provider.  Work with a Social worker or diabetes educator to identify strategies to manage stress and any emotional and social challenges. Questions to ask a health care provider  Do I need to meet with a diabetes educator?  Do I need to meet with a dietitian?  What number can I call if I have questions?  When are the best times to check my blood glucose? Where to find more information:  American Diabetes Association: diabetes.org  Academy of Nutrition and Dietetics: www.eatright.[employer] of Diabetes and Digestive and Kidney Diseases (NIH): [employer] Summary  A healthy meal plan will help you control your blood glucose and maintain a healthy lifestyle.  Working with a diet and nutrition specialist (dietitian) can help you make a meal plan that is best for you.  Keep in mind that carbohydrates (carbs) and alcohol have immediate effects on your blood glucose levels.  It is important to count carbs and to use alcohol carefully. This information is not intended to replace advice given to you by your health care provider. Make sure you discuss any questions you have with your health care provider. Document Revised: 04/13/2017 Document Reviewed: 06/05/2016 Elsevier Patient Education  [place].  Diabetes Mellitus and [place] care is an important part of your health, especially when you have diabetes. Diabetes may cause you to have problems because of poor blood flow (circulation) to your feet and legs, which can cause your skin to:  Become thinner and drier.  Break more easily.  Heal more slowly.  Peel and crack. You may also have nerve damage (neuropathy) in your legs and feet, causing decreased feeling in them. This means that you may not notice minor injuries to your feet that could lead to more serious problems. Noticing and addressing any potential problems early is the best way to prevent future foot  problems. How to care for your feet Foot hygiene  Wash your feet daily with warm water and mild soap. Do not use hot water. Then, pat your feet and the areas between your toes until they are completely dry. Do not soak your feet as this can dry your skin.  Trim your toenails straight across. Do not dig under them or around the cuticle. File the edges of your nails with an emery board or nail file.  Apply a moisturizing lotion or petroleum jelly to the skin on your feet and to dry, brittle toenails. Use lotion that does not contain alcohol and is unscented. Do not apply lotion between your toes. Shoes and socks  Wear clean socks or stockings every day. Make sure they are not too tight. Do not wear knee-high stockings since they may decrease blood flow to your legs.  Wear shoes that fit properly and have enough cushioning. Always look in your shoes before you put them on to be sure there are no objects inside.  To break in new shoes, wear them for just a few hours a day. This prevents injuries on your feet. Wounds, scrapes, corns, and calluses  Check your feet daily for blisters, cuts, bruises, sores, and redness. If you cannot see the bottom of your feet, use a mirror or ask someone for help.  Do not cut corns or calluses or try to remove them with medicine.  If you find a minor scrape, cut, or break in the skin on your feet, keep it and the skin around it clean and dry. You may clean these areas with mild soap and water. Do not clean the area with peroxide, alcohol, or iodine.  If you have a wound, scrape, corn, or callus on your foot, look at it several times a day to make sure it is healing and not infected. Check for: ? Redness, swelling, or pain. ? Fluid or blood. ? Warmth. ? Pus or a bad smell. General instructions  Do not cross your legs. This may decrease blood flow to your feet.  Do not use heating pads or hot water bottles on your feet. They may burn your skin. If you have  lost feeling in your feet or legs, you may not know this is happening until it is too late.  Protect your feet from hot and cold by wearing shoes, such as at the beach or on hot pavement.  Schedule a complete foot exam at least once a year (annually) or more often if you have foot  problems. If you have foot problems, report any cuts, sores, or bruises to your health care provider immediately. Contact a health care provider if:  You have a medical condition that increases your risk of infection and you have any cuts, sores, or bruises on your feet.  You have an injury that is not healing.  You have redness on your legs or feet.  You feel burning or tingling in your legs or feet.  You have pain or cramps in your legs and feet.  Your legs or feet are numb.  Your feet always feel cold.  You have pain around a toenail. Get help right away if:  You have a wound, scrape, corn, or callus on your foot and: ? You have pain, swelling, or redness that gets worse. ? You have fluid or blood coming from the wound, scrape, corn, or callus. ? Your wound, scrape, corn, or callus feels warm to the touch. ? You have pus or a bad smell coming from the wound, scrape, corn, or callus. ? You have a fever. ? You have a red line going up your leg. Summary  Check your feet every day for cuts, sores, red spots, swelling, and blisters.  Moisturize feet and legs daily.  Wear shoes that fit properly and have enough cushioning.  If you have foot problems, report any cuts, sores, or bruises to your health care provider immediately.  Schedule a complete foot exam at least once a year (annually) or more often if you have foot problems. This information is not intended to replace advice given to you by your health care provider. Make sure you discuss any questions you have with your health care provider. Document Revised: 01/22/2019 Document Reviewed: 06/02/2016 Elsevier Patient Education  [place].

## 2019-08-07 NOTE — Progress Notes (Addendum)
New Patient Office Visit  Subjective:  Patient ID: Sherry Hobbs, female    DOB: 1971/03/04  Age: 49 y.o. MRN: [id number]  CC:  Chief Complaint  Patient presents with  . Establish Care    feet tingling and sharp pain mostly left foot    HPI Sherry Hobbs presents to establish care. She has a PMH: of anemia, DM2, elevated serum creatinine, GERD, HA, HTN. She is overdue for a mammogram. Her main concerns today include:  1. Right leg: neuropathy-tingles, jolt of electricity. She reports an inadequate nerve conduction study in the past and she did not follow up with neurosurgery Dr. Melrose Nakayama, Dr. Lacinda Axon and  Dr. Sharlet Salina for sacroiliitis. She would like a referral back to a specialist who can do the nerve conduction study. She was placed on Lyrica when had back sx cyst b/w L3-L4 in 08/2018. She gets sciatic nerve pain and she had an epidural >1 year that helped the back.  2. Skin rash: Onset 8 mos ago, flat red, macular itchy scattered on extremities and in groin and a patch on hip. Brother celiac.  3. DM : x 5 years or longer. She has bee on Metformin 500 mg BID for years.  EYE: Dilated exam [facility] 2 mos ago. Feet examined by her regularly. Rt foot neuropathy. Left foot feels normal.  She bought a new meter and has had high readings:  FBS: [phone],  2 hr pp [phone].  Lab Results  Component Value Date   HGBA1C 5.6 07/08/2019    4. Anemia: Past Hgb 11.5 and she can recall getting iron infusions as a child. "I've been anemic all of my life. "  She had uterine ablation for DUB and required a blood transfusion after the ablation. No iron supplements. She took iron for a year after the ablation. She had a lap hysterectomy 07/06/2010 for DUB and required IV iron infusion post surgery. She is taking oral iron and tolerating it well.   Lab Results  Component Value Date   WBC 12.1 (H) 07/08/2019   HGB 7.9 (L) 07/08/2019   HCT 24.2 (L) 07/08/2019   MCV 87.7  07/08/2019   PLT 262 07/08/2019   5. HTN: She presents on lisinopril x3 year. No CP/SOB/edema, palpitations, lightheadedness or dizziness.   BP Readings from Last 3 Encounters:  08/07/19 115/70  07/15/19 (!) 153/79  07/08/19 119/74    Past Medical History:  Diagnosis Date  . Anemia    problems when having abnmornal uterine bleeding.  . Arthritis    lower back  . Depression   . Diabetes mellitus without complication ([place])   . DM (diabetes mellitus) ([place]) 08/07/2019  . Elevated serum creatinine   . GERD (gastroesophageal reflux disease)    OCC  NO MEDS  . Headache    occasional migraines  . History of blood transfusion 05/02/2016   due to abnormal uterine bleeding  . HTN (hypertension) 08/07/2019  . Hypertension   . Neuropathy    feet    Past Surgical History:  Procedure Laterality Date  . BACK SURGERY    . DILATION AND CURETTAGE OF UTERUS  05/03/2016   Procedure: DILATATION AND CURETTAGE;  Surgeon: [name], MD;  Location: [facility] ORS;  Service: Gynecology;;  . Murrell Redden & CURRETTAGE/HYSTROSCOPY WITH NOVASURE ABLATION N/A 12/03/2017   Procedure: DILATATION & CURETTAGE/HYSTEROSCOPY WITH MINERVA ABLATION;  Surgeon: [name], MD;  Location: [facility] ORS;  Service: Gynecology;  Laterality: N/A;  MINERVA REP CONTACTED [facility] VUONG [phone])  .  LAPAROSCOPIC ASSISTED VAGINAL HYSTERECTOMY N/A 07/07/2019   Procedure: LAPAROSCOPIC ASSISTED VAGINAL HYSTERECTOMY;  Surgeon: [name], MD;  Location: [facility] ORS;  Service: Gynecology;  Laterality: N/A;  possible TVH  . LAPAROSCOPIC BILATERAL SALPINGECTOMY Bilateral 07/07/2019   Procedure: LAPAROSCOPIC BILATERAL SALPINGECTOMY;  Surgeon: [name], MD;  Location: [facility] ORS;  Service: Gynecology;  Laterality: Bilateral;  . LUMBAR LAMINECTOMY/DECOMPRESSION MICRODISCECTOMY Left 06/24/2018   Procedure: LUMBAR HEMILAMINECTOMY, FORAMINOTOMY, CYST REMOVAL - 1 LEVEL LEFT L4-5;  Surgeon: [name], MD;  Location: [facility] ORS;  Service:  Neurosurgery;  Laterality: Left;    Family History  Problem Relation Age of Onset  . Diabetes Mother   . COPD Mother   . Emphysema Mother   . Osteoporosis Mother   . CAD Father   . Diabetes Sister   . Diabetes Brother   . CAD Brother   . Celiac disease Brother   . Renal cancer Maternal Uncle        1 uncle  . Brain cancer Maternal Uncle        different uncle  . Diabetes Maternal Grandmother   . Stroke Maternal Grandmother   . Bladder Cancer Maternal Grandfather   . Diabetes Brother   . Heart disease Brother   . Cancer Sister   . Nevi Sister     Social History   Socioeconomic History  . Marital status: Married    Spouse name: Gwyndolyn Saxon  . Number of children: Not on file  . Years of education: Not on file  . Highest education level: Not on file  Occupational History  . Occupation: Solicitor  Tobacco Use  . Smoking status: Never Smoker  . Smokeless tobacco: Never Used  Substance and Sexual Activity  . Alcohol use: No  . Drug use: No  . Sexual activity: Yes    Birth control/protection: None  Other Topics Concern  . Not on file  Social History Narrative  . Not on file   Social Determinants of Health   Financial Resource Strain:   . Difficulty of Paying Living Expenses:   Food Insecurity:   . Worried About Charity fundraiser in the Last Year:   . Arboriculturist in the Last Year:   Transportation Needs:   . Film/video editor (Medical):   Marland Kitchen Lack of Transportation (Non-Medical):   Physical Activity:   . Days of Exercise per Week:   . Minutes of Exercise per Session:   Stress:   . Feeling of Stress :   Social Connections:   . Frequency of Communication with Friends and Family:   . Frequency of Social Gatherings with Friends and Family:   . Attends Religious Services:   . Active Member of Clubs or Organizations:   . Attends Archivist Meetings:   Marland Kitchen Marital Status:   Intimate Partner Violence:   . Fear of Current or  Ex-Partner:   . Emotionally Abused:   Marland Kitchen Physically Abused:   . Sexually Abused:     ROS Review of Systems  Constitutional: Negative for chills, fatigue and fever.  HENT: Negative for congestion and sinus pain.   Eyes: Negative.   Respiratory: Negative for cough and shortness of breath.   Cardiovascular: Negative for chest pain, palpitations and leg swelling.  Gastrointestinal: Negative for abdominal pain, constipation and diarrhea.  Endocrine: Negative for cold intolerance, heat intolerance and polydipsia.  Genitourinary: Negative for difficulty urinating and dysuria.  Musculoskeletal: Negative for arthralgias and back pain.  Skin: Positive for rash.  Neurological: Negative for dizziness, weakness, numbness and headaches.       Right ft tingling and neuropathy reports incomplete nerve conduction and would like to see Neurology.   Hematological: Negative for adenopathy. Does not bruise/bleed easily.  Psychiatric/Behavioral:       Denies any depression or anxiety and PHQ-2 is zero.    Objective:   Today's Vitals: BP 115/70   Pulse 72   Temp (!) 96.3 F (35.7 C) (Temporal)   Ht 5' 2" (1.575 m)   Wt 188 lb 12.8 oz (85.6 kg)   LMP 11/26/2017   SpO2 99%   BMI 34.53 kg/m   Physical Exam Constitutional:      Appearance: Normal appearance. She is obese.  HENT:     Head: Normocephalic.  Eyes:     Extraocular Movements: Extraocular movements intact.     Conjunctiva/sclera: Conjunctivae normal.     Pupils: Pupils are equal, round, and reactive to light.  Cardiovascular:     Rate and Rhythm: Normal rate and regular rhythm.     Pulses: Normal pulses.     Heart sounds: Normal heart sounds.  Pulmonary:     Effort: Pulmonary effort is normal.     Breath sounds: Normal breath sounds.  Abdominal:     Palpations: Abdomen is soft.     Tenderness: There is no abdominal tenderness.  Musculoskeletal:        General: No tenderness.     Cervical back: Normal range of motion and neck  supple.     Right lower leg: No edema.     Left lower leg: No edema.  Skin:    General: Skin is warm and dry.     Comments: Very few, widely scattered over body,  red and macular large round pruritic rash. No raised area, no blisters.   Neurological:     General: No focal deficit present.     Mental Status: She is alert and oriented to person, place, and time.  Psychiatric:        Mood and Affect: Mood normal.        Behavior: Behavior normal.        Thought Content: Thought content normal.        Judgment: Judgment normal.     Assessment & Plan:   Problem List Items Addressed This Visit      Cardiovascular and Mediastinum   HTN (hypertension)     Digestive   GERD (gastroesophageal reflux disease)     Endocrine   DM (diabetes mellitus) ([place])   Relevant Orders   Comp Met (CMET) (Completed)   Urine Microalbumin w/creat. ratio (Completed)   TSH (Completed)     Other   Anemia - Primary   Relevant Orders   CBC with Differential/Platelet (Completed)   B12 and Folate Panel (Completed)   IBC + Ferritin (Completed)    Other Visit Diagnoses    Preventative health care       Neuropathy       Relevant Orders   Ambulatory referral to Neurology   Skin rash       Relevant Orders   Ambulatory referral to Dermatology   Celiac Disease Ab Screen w/Rfx   FH: celiac disease       Relevant Orders   Celiac Disease Ab Screen w/Rfx     Please check your blood sugar fasting in the morning and 2 hours after supper for a week and 2 hours after lunch for a week. Please bring those in.  Healthy diet with weight loss goal.   I will give you a referral to NEUROLOGY for your Rt foot. She may need to go back to Dr. Sharlet Salina versus starting over with a new provider given her extensive back hx.   I will give you a referral to DERMATOLOGY for your persistent skin rash. Etiology unclear. Will check celiac panel. FH celiac in brother.   Follow up in 2 mos.   Outpatient Encounter Medications  as of 08/07/2019  Medication Sig  . docusate sodium (COLACE) 100 MG capsule Take 1 capsule (100 mg total) by mouth 2 (two) times daily as needed.  . ferrous sulfate (FERROUSUL) 325 (65 FE) MG tablet Take 1 tablet (325 mg total) by mouth 2 (two) times daily.  Marland Kitchen ibuprofen (ADVIL) 800 MG tablet Take 1 tablet (800 mg total) by mouth every 8 (eight) hours as needed.  Marland Kitchen lisinopril (PRINIVIL,ZESTRIL) 10 MG tablet Take 10 mg by mouth every morning.   . metFORMIN (GLUCOPHAGE) 500 MG tablet Take 1,000 mg by mouth daily with breakfast.   . methocarbamol (ROBAXIN) 750 MG tablet Take 1 tablet (750 mg total) by mouth every 8 (eight) hours as needed for muscle spasms.  Marland Kitchen oxyCODONE-acetaminophen (PERCOCET/ROXICET) 5-325 MG tablet Take 1-2 tablets by mouth every 6 (six) hours as needed for moderate pain.  . pregabalin (LYRICA) 100 MG capsule Take 1 capsule (100 mg total) by mouth 2 (two) times daily.   No facility-administered encounter medications on file as of 08/07/2019.   Addendum: Stay on the oral iron. Hgb is better. CBC after surgery is normalizing. Recheck CBC in 3 mos.   B12 deficient- Begin B12 IM injections as that will get your B12 up quickly and may help with your neuropathy. She will need to come in for injections or be taught by nurse visit. Recheck B12 in 3 mos and if normal may switch to oral supplement.   Thyroid is normal. No protein in urine which is good. Follow good diabetic healthy eating and send in sugar readings by MyChart in 2 weeks. Your recent A1c was good. Stay on your Metformin.  Follow-up: Return in about 2 months (around 10/07/2019).   Denice Paradise, NP

## 2019-08-08 ENCOUNTER — Telehealth: Payer: Self-pay | Admitting: *Deleted

## 2019-08-08 LAB — CELIAC DISEASE AB SCREEN W/RFX
Antigliadin Abs, IgA: 6 units (ref 0–19)
IgA/Immunoglobulin A, Serum: 307 mg/dL (ref 87–352)
Transglutaminase IgA: <2 U/mL (ref 0–3)

## 2019-08-12 DIAGNOSIS — Z0289 Encounter for other administrative examinations: Secondary | ICD-10-CM

## 2019-08-13 ENCOUNTER — Encounter: Payer: Self-pay | Admitting: Nurse Practitioner

## 2019-08-19 ENCOUNTER — Encounter: Payer: Self-pay | Admitting: Obstetrics and Gynecology

## 2019-08-19 ENCOUNTER — Ambulatory Visit (INDEPENDENT_AMBULATORY_CARE_PROVIDER_SITE_OTHER): Payer: Managed Care, Other (non HMO) | Admitting: Obstetrics and Gynecology

## 2019-08-19 VITALS — BP 111/72 | HR 77 | Ht 62.0 in | Wt 189.1 lb

## 2019-08-19 DIAGNOSIS — Z1231 Encounter for screening mammogram for malignant neoplasm of breast: Secondary | ICD-10-CM

## 2019-08-19 DIAGNOSIS — Z862 Personal history of diseases of the blood and blood-forming organs and certain disorders involving the immune mechanism: Secondary | ICD-10-CM

## 2019-08-19 DIAGNOSIS — Z90710 Acquired absence of both cervix and uterus: Secondary | ICD-10-CM

## 2019-08-19 DIAGNOSIS — Z4889 Encounter for other specified surgical aftercare: Secondary | ICD-10-CM

## 2019-08-19 NOTE — Progress Notes (Signed)
Pt present for 5 week f/u for hysterectomy surgery. Pt stated that she was doing well no problems.

## 2019-08-19 NOTE — Progress Notes (Signed)
    OBSTETRICS/GYNECOLOGY POST-OPERATIVE CLINIC VISIT  Subjective:     Sherry Hobbs is a 49 y.o. P0 female who presents to the clinic 1 weeks status post laparoscopic assisted vaginal hysterectomy for abnormal uterine bleeding. Also received iron infusion inpatient for h/o anemia. Eating a regular diet without difficulty. Bowel movements are normal. The patient is not having any pain.  Overall feeling well.     The following portions of the patient's history were reviewed and updated as appropriate: allergies, current medications, past family history, past medical history, past social history, past surgical history and problem list.   Review of Systems Pertinent items noted in HPI and remainder of comprehensive ROS otherwise negative.    Objective:    BP 111/72   Pulse 77   Ht 5\' 2"  (1.575 m)   Wt 189 lb 1.6 oz (85.8 kg)   LMP 11/26/2017   BMI 34.59 kg/m  General:  alert and no distress  Abdomen: soft, bowel sounds active, non-tender  Incision:   healing well, no drainage, no erythema, no hernia, no seroma, no swelling, no dehiscence, incision well approximated  Pelvis:  external genitalia normal, rectovaginal septum normal.  Vagina without discharge.  Vaginal cuff healing well, small area of granulation tissue. Bimanual exam not performed.     Pathology:  DIAGNOSIS:  A. UTERUS, CERVIX, AND BILATERAL FALLOPIAN TUBES; TOTAL HYSTERECTOMY  WITH BILATERAL SALPINGECTOMY:  - ENDOMETRIUM:    - CHANGES CONSISTENT WITH PROGESTIN EFFECT.    - NEGATIVE FOR ATYPIA / EIN AND MALIGNANCY.  - CERVIX:    - NEGATIVE FOR DYSPLASIA AND MALIGNANCY.  - MYOMETRIUM AND SEROSA:    - NO SIGNIFICANT PATHOLOGIC ALTERATION.  - BILATERAL FALLOPIAN TUBES:    - NEGATIVE FOR ATYPIA AND MALIGNANCY.     Labs:  CBC Latest Ref Rng & Units 08/07/2019 07/08/2019 07/03/2019  WBC 4.0 - 10.5 K/uL 8.0 12.1(H) 10.4  Hemoglobin 12.0 - 15.0 g/dL 12.2 7.9(L) 9.7(L)  Hematocrit 36.0 - 46.0 % 36.4  24.2(L) 29.8(L)  Platelets 150.0 - 400.0 K/uL 241.0 262 298    Assessment:   S/p LAVH with bilateral slapingectomy Doing well postoperatively. History of anemia  Plan:   1. Can continue iron tablets until the end of the month, then can discontinue. Notes new PCP informed she could take every other day. Also started patient on B12 injections due to low levels.  2. Operative findings again reviewed. Pathology report discussed. 3. Activity restrictions: none.  4. Anticipated return to work: now. 5. Follow up: 3 months for annual exam. Will order mammogram    [name], MD [facility]

## 2019-08-22 NOTE — Telephone Encounter (Signed)
Error

## 2019-08-26 ENCOUNTER — Other Ambulatory Visit: Payer: Self-pay | Admitting: Obstetrics and Gynecology

## 2019-08-27 ENCOUNTER — Ambulatory Visit
Admission: RE | Admit: 2019-08-27 | Discharge: 2019-08-27 | Disposition: A | Discharge status: Home / Self Care | Payer: Managed Care, Other (non HMO) | Source: Ambulatory Visit | Attending: Obstetrics and Gynecology | Admitting: Obstetrics and Gynecology

## 2019-08-27 DIAGNOSIS — Z1231 Encounter for screening mammogram for malignant neoplasm of breast: Secondary | ICD-10-CM | POA: Insufficient documentation

## 2019-08-29 ENCOUNTER — Ambulatory Visit (INDEPENDENT_AMBULATORY_CARE_PROVIDER_SITE_OTHER): Payer: Managed Care, Other (non HMO) | Admitting: Dermatology

## 2019-08-29 ENCOUNTER — Other Ambulatory Visit: Payer: Self-pay

## 2019-08-29 ENCOUNTER — Encounter: Payer: Self-pay | Admitting: Dermatology

## 2019-08-29 DIAGNOSIS — L304 Erythema intertrigo: Secondary | ICD-10-CM | POA: Diagnosis not present

## 2019-08-29 DIAGNOSIS — L308 Other specified dermatitis: Secondary | ICD-10-CM

## 2019-08-29 DIAGNOSIS — D2372 Other benign neoplasm of skin of left lower limb, including hip: Secondary | ICD-10-CM | POA: Diagnosis not present

## 2019-08-29 MED ORDER — TRIAMCINOLONE ACETONIDE 0.1 % EX CREA: TOPICAL_CREAM | CUTANEOUS | 2 refills | Status: DC

## 2019-08-29 MED ORDER — KETOCONAZOLE 2 % EX CREA: TOPICAL_CREAM | CUTANEOUS | 11 refills | Status: DC

## 2019-08-29 MED ORDER — HYDROCORTISONE 2.5 % EX CREA: TOPICAL_CREAM | CUTANEOUS | 3 refills | Status: DC

## 2019-08-29 NOTE — Patient Instructions (Addendum)
Gentle Skin Care Guide  1. Bathe no more than once a day.  2. Avoid bathing in hot water  3. Use a mild soap like Dove, Vanicream, Cetaphil, CeraVe. Can use Lever 2000 or  Cetaphil antibacterial soap  4. Use soap only where you need it. On most days, use it under your arms, between  your legs, and on your fee. Let the water rinse other areas unless visibly dirty.  5. When you get out of the bath/shower, use a towel to gently blot your skin dry, don't rub it.  6. While your skin is still a little damp, apply a moisturizing cream such as Vanicream,  CeraVe, Cetaphil, Eucerin, Sarna lotion or plain Vaseline Jelly. For hands apply Neutrogena [place] Hand Cream or Excipial Hand Cream.  7. Reapply moisturizer any time you start to itch or feel dry.  8. Sometimes using free and clear laundry detergents can be helpful. Fabric softener  sheets should be avoided. Downy Free & Gentle liquid, or any liquid fabric softener that is free of dyes and perfumes, it acceptable to use  9. If your doctor has given you prescription creams you may apply moisturizers over them    ECZEMA:  Start Triamcinolone 0.1% cream Apply to affected areas where itchy rash twice daily until rash is gone,  stop when gone. Avoid Face, groin, and underarms  Topical steroids (such as triamcinolone, fluocinolone, fluocinonide, mometasone, clobetasol, halobetasol, betamethasone, hydrocortisone) can cause thinning and lightening of the skin if they are used for too long in the same area. Your physician has selected the right strength medicine for your problem and area affected on the body. Please use your medication only as directed by your physician to prevent side effects.    INTERTRIGO: Start Hydrocortisone 2.5% cream Apply twice daily to affected area with rash, for up to 1 to 2 weeks only Start Ketoconazole 2% cream Apply twice daily to affected area with rash, for up to 1 to 2 weeks only Start antifungal powder to  affected area daily for prevention

## 2019-08-29 NOTE — Progress Notes (Signed)
   New Patient Visit  Subjective  Sherry Hobbs is a 49 y.o. female who presents for the following: Rash (B/L upper and lower extremities) and discolored area (Groin area).  Patient has had rash for about a year, has tried OTC Hydrocortisone, which does help with the itch but does not make it go away.  Patient has had a discolored itchy area in her groin for the past 6 to 8 months, has used OTC Jock itch cream and Monistat cream which did not do anything for the area.  Patient is currently using Dial soap QD  Objective  Well appearing patient in no apparent distress; mood and affect are within normal limits.  A full examination was performed including scalp, head, eyes, ears, nose, lips, neck, chest, axillae, abdomen, back, buttocks, bilateral upper extremities, bilateral lower extremities, hands, feet, fingers, toes, fingernails, and toenails. All findings within normal limits unless otherwise noted below.  Objective  Left Groin, Right groin: Macerated erythematous patch  Objective  Left Lower Leg - Anterior, Left Upper Arm - Anterior, Right Lower Leg - Anterior, Right Upper Arm - Anterior: Scaly erythematous papules and patches   Assessment & Plan  Erythema intertrigo (2) Left Groin; Right groin  Start Hydrocortisone 2.5% cream Apply twice daily to affected area with rash, for up to 1 to 2 weeks only  Start Ketoconazole 2% cream Apply twice daily to affected area with rash, for up to 1 to 2 weeks only  Start antifungal powder to affected area daily for prevention  Keep area dry as much as possible.  Topical steroids (such as triamcinolone, fluocinolone, fluocinonide, mometasone, clobetasol, halobetasol, betamethasone, hydrocortisone) can cause thinning and lightening of the skin if they are used for too long in the same area. Your physician has selected the right strength medicine for your problem and area affected on the body. Please use your medication only as directed by  your physician to prevent side effects.    Ordered Medications: hydrocortisone 2.5 % cream ketoconazole (NIZORAL) 2 % cream  Other eczema (4) Left Upper Arm - Anterior; Right Upper Arm - Anterior; Left Lower Leg - Anterior; Right Lower Leg - Anterior  Chronic, flared  Start Triamcinolone 0.1% cream Apply to affected areas where itchy rash twice daily until rash is gone. Avoid Face, groin, and underarms  Topical steroids (such as triamcinolone, fluocinolone, fluocinonide, mometasone, clobetasol, halobetasol, betamethasone, hydrocortisone) can cause thinning and lightening of the skin if they are used for too long in the same area. Your physician has selected the right strength medicine for your problem and area affected on the body. Please use your medication only as directed by your physician to prevent side effects.   Recommend gentle skin care   Ordered Medications: triamcinolone cream (KENALOG) 0.1 %  Dermatofibroma - Firm pink/brown papulenodule with dimple sign - Benign appearing - Call for any changes    Left shin  Return 4 to 6 weeks, for Eczema and Intertrigo.   IDonzetta Kohut, CMA, am acting as scribe for Forest Gleason, MD .  Documentation: I have reviewed the above documentation for accuracy and completeness, and I agree with the above.  Forest Gleason, MD

## 2019-09-03 ENCOUNTER — Encounter: Payer: Self-pay | Admitting: Dermatology

## 2019-09-05 ENCOUNTER — Telehealth: Payer: Self-pay | Admitting: Nurse Practitioner

## 2019-09-05 NOTE — Telephone Encounter (Signed)
Patient's Disablility paperwork was faxed to Dr Olivia Mackie in Error I have placed this in [employer].

## 2019-09-05 NOTE — Telephone Encounter (Signed)
FYI patient is scheduled for virtual visit on Monday to fill out FMLA paperwork. She is aware that there may be a charge. Her mom is a patient here of Dr. Audrie Gallus so we do have access to chart.

## 2019-09-09 ENCOUNTER — Encounter: Payer: Self-pay | Admitting: Nurse Practitioner

## 2019-09-09 ENCOUNTER — Telehealth: Payer: Managed Care, Other (non HMO) | Admitting: Nurse Practitioner

## 2019-09-09 ENCOUNTER — Other Ambulatory Visit: Payer: Self-pay

## 2019-09-09 VITALS — Temp 97.8°F | Ht 62.0 in | Wt 186.0 lb

## 2019-09-09 DIAGNOSIS — Z0289 Encounter for other administrative examinations: Secondary | ICD-10-CM

## 2019-09-09 NOTE — Progress Notes (Signed)
Virtual Visit via Video Note  I connected with@ on 09/09/19 at 10:00 AM EDT by a video enabled telemedicine application and verified that I am speaking with the correct person using two identifiers. This visit type was conducted due to national recommendations for restrictions regarding the COVID-19 Pandemic (e.g. social distancing).  This format is felt to be most appropriate for this patient at this time.   I discussed the limitations of evaluation and management by telemedicine and the availability of in person appointments. The patient expressed understanding and agreed to proceed.  Only the patient and myself were on today's video visit. The patient was at home and I was in my office at the time of today's visit.   History of Present Illness:  Sherry Hobbs  presents on video to get a form completed so that she can take a leave of absence from her job to care for her mother, Sherry Hobbs. Her mother is in the background and gives permission to include her diagnosis and plan of care. Ga[place] gave [place] a document to complete from  [place]. We spent 20 minutes going through the form line by line. [place] will pick up the completed form today.    Denice Paradise, NP

## 2019-09-17 ENCOUNTER — Encounter: Payer: Self-pay | Admitting: Student in an Organized Health Care Education/Training Program

## 2019-09-18 ENCOUNTER — Other Ambulatory Visit: Payer: Self-pay

## 2019-09-18 ENCOUNTER — Encounter: Payer: Self-pay | Admitting: Student in an Organized Health Care Education/Training Program

## 2019-09-18 ENCOUNTER — Ambulatory Visit
Payer: Managed Care, Other (non HMO) | Attending: Student in an Organized Health Care Education/Training Program | Admitting: Student in an Organized Health Care Education/Training Program

## 2019-09-18 DIAGNOSIS — M5416 Radiculopathy, lumbar region: Secondary | ICD-10-CM | POA: Diagnosis not present

## 2019-09-18 DIAGNOSIS — Z9889 Other specified postprocedural states: Secondary | ICD-10-CM | POA: Diagnosis not present

## 2019-09-18 DIAGNOSIS — G894 Chronic pain syndrome: Secondary | ICD-10-CM | POA: Diagnosis not present

## 2019-09-18 DIAGNOSIS — G8929 Other chronic pain: Secondary | ICD-10-CM

## 2019-09-18 MED ORDER — PREGABALIN 100 MG PO CAPS: 100.0000 mg | ORAL_CAPSULE | Freq: Two times a day (BID) | ORAL | 3 refills | Status: DC

## 2019-09-18 NOTE — Progress Notes (Signed)
Patient: Sherry Hobbs  Service Category: E/M  Provider: Gillis Santa, MD  DOB: 1970/09/20  DOS: 09/18/2019  Location: Office  MRN: [id number]  Setting: Ambulatory outpatient  Referring Provider: Theotis Burrow*  Type: Established Patient  Specialty: Interventional Pain Management  PCP: Marval Regal, NP  Location: Home  Delivery: TeleHealth     Virtual Encounter - Pain Management PROVIDER NOTE: Information contained herein reflects review and annotations entered in association with encounter. Interpretation of such information and data should be left to medically-trained personnel. Information provided to patient can be located elsewhere in the medical record under "Patient Instructions". Document created using STT-dictation technology, any transcriptional errors that may result from process are unintentional.    Contact & Pharmacy Preferred: [phone] Home: [phone] (home) Mobile: [phone] (mobile) E-mail: [email or web address]  [id number] [id number]: [phone]: [id number]: [phone]: [phone]  Pre-screening  [name] offered "in-person" vs "virtual" encounter. She indicated preferring virtual for this encounter.   Reason COVID-19*  Social distancing based on CDC and AMA recommendations.   I contacted CLewis Moccasinon 09/18/2019 via televisit.      I clearly identified myself as BGillis Santa MD. I verified that I was speaking with the correct person using two identifiers (Name: CKRYSTEENA Hobbs and date of birth: 1970-08-02.  Consent I sought verbal advanced consent from CLewis Moccasinfor virtual visit interactions. I informed Ms. Osentoski of possible security and privacy concerns, risks, and limitations associated with providing "not-in-person"  medical evaluation and management services. I also informed Ms. Shakoor of the availability of "in-person" appointments. Finally, I informed her that there would be a charge for the virtual visit and that she could be  personally, fully or partially, financially responsible for it. Ms. BSelleyexpressed understanding and agreed to proceed.   Historic Elements   [name] a 49y.o. year old, female patient evaluated today after her last contact with our practice on 07/01/2019. [name] has a past medical history of Anemia, Arthritis, Depression, Diabetes mellitus without complication [place], DM [place]diabetes mellitus) ([place] (08/07/2019), Elevated serum creatinine, GERD (gastroesophageal reflux disease), Headache, History of blood transfusion (05/02/2016), HTN (hypertension) (08/07/2019), Hypertension, and Neuropathy. She also  has a past surgical history that includes Dilation and curettage of uterus (05/03/2016); Dilatation & currettage/hysteroscopy with novasure ablation (N/A, 12/03/2017); Lumbar laminectomy/decompression microdiscectomy (Left, 06/24/2018); Back surgery; Laparoscopic assisted vaginal hysterectomy (N/A, 07/07/2019); Laparoscopic bilateral salpingectomy (Bilateral, 07/07/2019); and Abdominal hysterectomy. [name] a current medication list which includes the following prescription(s): cyanocobalamin, hydrocortisone, ibuprofen, ketoconazole, lisinopril, metformin, methocarbamol, pregabalin, and triamcinolone cream. She  reports that she has never smoked. She has never used smokeless tobacco. She reports that she does not drink alcohol or use drugs. [name] allergic to codeine.   HPI  Today, she is being contacted for medication management.   S/p hysterecomy on 2/22. Went well. Also saw neurology, obtained lab work and did EMG/NCV study- will obtain results on the 12th Still seeing benefit from epidural L5/S1 done in Oct 2020 however having occasional spasms in left lateral  leg Continuing Lyrica 100 mg twice a day, ok to increase to 100 mg TID Takes Robaxin prn  Laboratory Chemistry Profile   Renal Lab Results  Component Value Date  BUN 13 08/07/2019   CREATININE 1.10 08/07/2019   GFR 53.40 (L) 08/07/2019   GFRAA >60 07/08/2019   GFRNONAA 56 (L) 07/08/2019     Hepatic Lab Results  Component Value Date   AST 22 08/07/2019   ALT 29 08/07/2019   ALBUMIN 4.1 08/07/2019   ALKPHOS 90 08/07/2019   HCVAB <0.1 03/30/2016     Electrolytes Lab Results  Component Value Date   NA 137 08/07/2019   K 4.2 08/07/2019   CL 104 08/07/2019   CALCIUM 9.6 08/07/2019     Bone No results found for: VD25OH, VD125OH2TOT, SW9675FF6, BW4665LD3, 25OHVITD1, 25OHVITD2, 25OHVITD3, TESTOFREE, TESTOSTERONE   Inflammation (CRP: Acute Phase) (ESR: Chronic Phase) No results found for: CRP, ESRSEDRATE, LATICACIDVEN     Note: Above Lab results reviewed.  Imaging  MM 3D SCREEN BREAST BILATERAL CLINICAL DATA:  Screening.  EXAM: DIGITAL SCREENING BILATERAL MAMMOGRAM WITH TOMO AND CAD  COMPARISON:  Previous exam(s).  ACR Breast Density Category b: There are scattered areas of fibroglandular density.  FINDINGS: There are no findings suspicious for malignancy. Images were processed with CAD.  IMPRESSION: No mammographic evidence of malignancy. A result letter of this screening mammogram will be mailed directly to the patient.  RECOMMENDATION: Screening mammogram in one year. (Code:[id number]-B-01Y)  BI-RADS CATEGORY  1: Negative.  Electronically Signed   By: Everlean Alstrom M.D.   On: 08/27/2019 09:58  Assessment  The primary encounter diagnosis was Chronic pain syndrome. Diagnoses of Chronic radicular lumbar pain, History of lumbar surgery, and Lumbar radiculopathy were also pertinent to this visit.  Plan of Care   Ms. [name] has a current medication list which includes the following long-term medication(s): lisinopril and pregabalin.  1. Chronic  pain syndome -Ibuprofen and Robaxin as needed - pregabalin (LYRICA) 100 MG capsule; Take 1 capsule (100 mg total) by mouth 2 (two) times daily.  Dispense: 60 capsule; Refill: 3  2. Chronic radicular lumbar pain Ibuprofen and Robaxin as needed- - pregabalin (LYRICA) 100 MG capsule; Take 1 capsule (100 mg total) by mouth 2 (two) times daily.  Dispense: 60 capsule; Refill: 3 - Lumbar Epidural Injection; Standing  3. Lumbar radiculopathy - pregabalin (LYRICA) 100 MG capsule; Take 1 capsule (100 mg total) by mouth 2 (two) times daily.  Dispense: 60 capsule; Refill: 3 - Lumbar Epidural Injection; Standing -Consider SCS trial in future  Pharmacotherapy (Medications Ordered): Meds ordered this encounter  Medications  . pregabalin (LYRICA) 100 MG capsule    Sig: Take 1 capsule (100 mg total) by mouth 2 (two) times daily.    Dispense:  60 capsule    Refill:  3    Fill one day early if pharmacy is closed on scheduled refill date. May substitute for generic if available.   Orders:  Orders Placed This Encounter  Procedures  . Lumbar Epidural Injection    Standing Status:   Standing    Number of Occurrences:   9    Standing Expiration Date:   03/20/2021    Scheduling Instructions:     Purpose: Palliative     Indication: Lower extremity pain/Sciatica unspecified side (M54.30).     Side: Midline     Level: TBD     Sedation: Patient's choice.     TIMEFRAME: PRN procedure. (Ms. Rawdon will call when needed.)    Order Specific Question:   Where will this procedure be performed?    Answer:   [facility]   Follow-up plan:   No follow-ups on  file.     History of L4-5 laminectomy with Dr. Lacinda Axon due to synovial cyst now having left L4-L5 radicular pain status post L5-S1 ESI on the left on 03/05/2019, helped significantly, repeat PRN     Recent Visits Date Type Provider Dept  07/01/19 Office Visit Gillis Santa, MD [facility]  Showing recent visits within past 90 days and  meeting all other requirements   Today's Visits Date Type Provider Dept  09/18/19 Office Visit Gillis Santa, MD [facility]  Showing today's visits and meeting all other requirements   Future Appointments No visits were found meeting these conditions.  Showing future appointments within next 90 days and meeting all other requirements   I discussed the assessment and treatment plan with the patient. The patient was provided an opportunity to ask questions and all were answered. The patient agreed with the plan and demonstrated an understanding of the instructions.  Patient advised to call back or seek an in-person evaluation if the symptoms or condition worsens.  Duration of encounter: 25 minutes.  Note by: Gillis Santa, MD Date: 09/18/2019; Time: 9:16 AM

## 2019-09-20 ENCOUNTER — Other Ambulatory Visit: Payer: Self-pay | Admitting: Obstetrics and Gynecology

## 2019-09-23 DIAGNOSIS — R208 Other disturbances of skin sensation: Secondary | ICD-10-CM | POA: Insufficient documentation

## 2019-10-06 ENCOUNTER — Ambulatory Visit: Payer: Managed Care, Other (non HMO) | Admitting: Dermatology

## 2019-10-07 ENCOUNTER — Ambulatory Visit: Payer: Managed Care, Other (non HMO) | Admitting: Nurse Practitioner

## 2019-10-07 DIAGNOSIS — Z0289 Encounter for other administrative examinations: Secondary | ICD-10-CM

## 2019-10-07 DIAGNOSIS — N939 Abnormal uterine and vaginal bleeding, unspecified: Secondary | ICD-10-CM | POA: Insufficient documentation

## 2019-10-31 ENCOUNTER — Other Ambulatory Visit: Payer: Self-pay

## 2019-11-04 ENCOUNTER — Ambulatory Visit: Payer: Managed Care, Other (non HMO) | Admitting: Nurse Practitioner

## 2019-11-04 DIAGNOSIS — Z0289 Encounter for other administrative examinations: Secondary | ICD-10-CM

## 2019-11-09 ENCOUNTER — Other Ambulatory Visit: Payer: Self-pay | Admitting: Obstetrics and Gynecology

## 2019-11-13 ENCOUNTER — Ambulatory Visit (INDEPENDENT_AMBULATORY_CARE_PROVIDER_SITE_OTHER): Payer: Managed Care, Other (non HMO) | Admitting: Nurse Practitioner

## 2019-11-13 ENCOUNTER — Other Ambulatory Visit: Payer: Self-pay

## 2019-11-13 VITALS — BP 124/82 | HR 83 | Temp 98.0°F | Wt 193.0 lb

## 2019-11-13 DIAGNOSIS — E119 Type 2 diabetes mellitus without complications: Secondary | ICD-10-CM

## 2019-11-13 DIAGNOSIS — E538 Deficiency of other specified B group vitamins: Secondary | ICD-10-CM | POA: Diagnosis not present

## 2019-11-13 DIAGNOSIS — M79604 Pain in right leg: Secondary | ICD-10-CM

## 2019-11-13 DIAGNOSIS — R252 Cramp and spasm: Secondary | ICD-10-CM

## 2019-11-13 DIAGNOSIS — D649 Anemia, unspecified: Secondary | ICD-10-CM | POA: Diagnosis not present

## 2019-11-13 DIAGNOSIS — M79605 Pain in left leg: Secondary | ICD-10-CM

## 2019-11-13 LAB — HEMOGLOBIN A1C: Hgb A1c MFr Bld: 6.9 % — ABNORMAL HIGH (ref 4.6–6.5)

## 2019-11-13 LAB — CBC WITH DIFFERENTIAL/PLATELET
Basophils Absolute: 0 10*3/uL (ref 0.0–0.1)
Basophils Relative: 0.4 % (ref 0.0–3.0)
Eosinophils Absolute: 0.1 10*3/uL (ref 0.0–0.7)
Eosinophils Relative: 1.4 % (ref 0.0–5.0)
HCT: 36.3 % (ref 36.0–46.0)
Hemoglobin: 12.6 g/dL (ref 12.0–15.0)
Lymphocytes Relative: 15.6 % (ref 12.0–46.0)
Lymphs Abs: 1.7 10*3/uL (ref 0.7–4.0)
MCHC: 34.8 g/dL (ref 30.0–36.0)
MCV: 83.9 fl (ref 78.0–100.0)
Monocytes Absolute: 0.7 10*3/uL (ref 0.1–1.0)
Monocytes Relative: 6.4 % (ref 3.0–12.0)
Neutro Abs: 8 10*3/uL — ABNORMAL HIGH (ref 1.4–7.7)
Neutrophils Relative %: 76.2 % (ref 43.0–77.0)
Platelets: 237 10*3/uL (ref 150.0–400.0)
RBC: 4.33 Mil/uL (ref 3.87–5.11)
RDW: 14 % (ref 11.5–15.5)
WBC: 10.5 10*3/uL (ref 4.0–10.5)

## 2019-11-13 LAB — BASIC METABOLIC PANEL
BUN: 13 mg/dL (ref 6–23)
CO2: 28 mEq/L (ref 19–32)
Calcium: 9.6 mg/dL (ref 8.4–10.5)
Chloride: 100 mEq/L (ref 96–112)
Creatinine, Ser: 1.06 mg/dL (ref 0.40–1.20)
GFR: 55.67 mL/min — ABNORMAL LOW (ref >60.00)
Glucose, Bld: 141 mg/dL — ABNORMAL HIGH (ref 70–99)
Potassium: 4.5 mEq/L (ref 3.5–5.1)
Sodium: 138 mEq/L (ref 135–145)

## 2019-11-13 LAB — LIPID PANEL
Cholesterol: 161 mg/dL (ref 0–200)
HDL: 31.3 mg/dL — ABNORMAL LOW (ref >39.00)
NonHDL: 129.93
Total CHOL/HDL Ratio: 5
Triglycerides: 279 mg/dL — ABNORMAL HIGH (ref 0.0–149.0)
VLDL: 55.8 mg/dL — ABNORMAL HIGH (ref 0.0–40.0)

## 2019-11-13 LAB — LDL CHOLESTEROL, DIRECT: Direct LDL: 85 mg/dL

## 2019-11-13 LAB — B12 AND FOLATE PANEL
Folate: 13 ng/mL (ref >5.9)
Vitamin B-12: 482 pg/mL (ref 211–911)

## 2019-11-13 NOTE — Progress Notes (Signed)
Established Patient Office Visit  Subjective:  Patient ID: Sherry Hobbs, female    DOB: 26-Apr-1971  Age: 49 y.o. MRN: [id number]  CC:  Chief Complaint  Patient presents with  . 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  Bilateral cramps at night in her bilateral lower extremities calves: New onset. Stays hydrated. No statin use. No lower leg edema, calf pain or swelling.   Right leg neuropathy: She has been seen by neurology for bilateral numbness and tingling in the legs and feet, worse in the right foot than the left..Neurology: right leg showed abnormal study.  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 Lyrica (pregalin) 50 mg 3 times daily. She often forgets her lunch dose, but the medication does help. Right foot burning, numbness- like it is asleep. Her feet feel ice cold to her, but warm to touch. She has to keep them covered at night . Further testing with Neurology is planned.   Right hip pain: She had sciatic nerve injection by  Dr. Gillis Santa- helped.  T2DM: Monitoring at home FBS: [phone]   PP supper: 211-233--99.  She is taking Metformin 1000 mg daily with breakfast.  She is not taking any p.m. Metformin.  She is tolerating the medication well.  Lab Results  Component Value Date   HGBA1C 6.9 (H) 11/13/2019   HTN:   Lisinopril 10 mg daily and has been on his medication for 3 years. No CP/SOB/edema, palpitations, lightheadedness or dizziness.   BP Readings from Last 3 Encounters:  11/13/19 124/82  08/19/19 111/72  08/07/19 115/70   B12 deficient- She was started on B12 IM injections as that will get your B12 up quickly and may help with your neuropathy. She will need to come in for injections or be taught by  nurse visit. Recheck B12 in 3 mos and if normal may switch to oral supplement.    Past Medical History:  Diagnosis Date  . Anemia    problems when having abnmornal uterine bleeding.  . Arthritis    lower back  . Depression   . Diabetes mellitus without complication ([place])   . DM (diabetes mellitus) ([place]) 08/07/2019  . Elevated serum creatinine   . GERD (gastroesophageal reflux disease)    OCC  NO MEDS  . Headache    occasional migraines  . History of blood transfusion 05/02/2016   due to abnormal uterine bleeding  . HTN (hypertension) 08/07/2019  . Hypertension   . Neuropathy    feet    Past Surgical History:  Procedure Laterality Date  . ABDOMINAL HYSTERECTOMY    . BACK SURGERY    . DILATION AND CURETTAGE OF UTERUS  05/03/2016   Procedure: DILATATION AND CURETTAGE;  Surgeon: [name], MD;  Location: [facility];  Service: Gynecology;;  . Murrell Redden & CURRETTAGE/HYSTROSCOPY WITH NOVASURE ABLATION N/A 12/03/2017   Procedure: DILATATION & CURETTAGE/HYSTEROSCOPY WITH MINERVA ABLATION;  Surgeon: [name], MD;  Location: [facility];  Service: Gynecology;  Laterality: N/A;  MINERVA REP CONTACTED [facility] [phone])  . LAPAROSCOPIC ASSISTED VAGINAL HYSTERECTOMY N/A 07/07/2019   Procedure: LAPAROSCOPIC ASSISTED VAGINAL HYSTERECTOMY;  Surgeon: [name], MD;  Location: [facility];  Service: Gynecology;  Laterality: N/A;  possible TVH  . LAPAROSCOPIC BILATERAL SALPINGECTOMY Bilateral 07/07/2019   Procedure: LAPAROSCOPIC  BILATERAL SALPINGECTOMY;  Surgeon: [name], MD;  Location: [facility];  Service: Gynecology;  Laterality: Bilateral;  . LUMBAR LAMINECTOMY/DECOMPRESSION MICRODISCECTOMY Left 06/24/2018   Procedure: LUMBAR HEMILAMINECTOMY, FORAMINOTOMY, CYST REMOVAL - 1 LEVEL LEFT L4-5;  Surgeon: [name], MD;  Location: [facility];  Service: Neurosurgery;  Laterality: Left;    Family History  Problem Relation Age of Onset  . Diabetes Mother   . COPD Mother   . Emphysema  Mother   . Osteoporosis Mother   . Lung cancer Mother 79  . Breast cancer Mother   . CAD Father   . Diabetes Sister   . Diabetes Brother   . CAD Brother   . Celiac disease Brother   . Renal cancer Maternal Uncle        1 uncle  . Brain cancer Maternal Uncle        different uncle  . Diabetes Maternal Grandmother   . Stroke Maternal Grandmother   . Bladder Cancer Maternal Grandfather   . Diabetes Brother   . Heart disease Brother   . Cancer Sister   . Nevi Sister     Social History   Socioeconomic History  . Marital status: Married    Spouse name: Gwyndolyn Saxon  . Number of children: Not on file  . Years of education: Not on file  . Highest education level: Not on file  Occupational History  . Occupation: Solicitor  Tobacco Use  . Smoking status: Never Smoker  . Smokeless tobacco: Never Used  Vaping Use  . Vaping Use: Never used  Substance and Sexual Activity  . Alcohol use: No  . Drug use: No  . Sexual activity: Not Currently    Birth control/protection: None  Other Topics Concern  . Not on file  Social History Narrative  . Not on file   Social Determinants of Health   Financial Resource Strain:   . Difficulty of Paying Living Expenses:   Food Insecurity:   . Worried About Charity fundraiser in the Last Year:   . Arboriculturist in the Last Year:   Transportation Needs:   . Film/video editor (Medical):   Marland Kitchen Lack of Transportation (Non-Medical):   Physical Activity:   . Days of Exercise per Week:   . Minutes of Exercise per Session:   Stress:   . Feeling of Stress :   Social Connections:   . Frequency of Communication with Friends and Family:   . Frequency of Social Gatherings with Friends and Family:   . Attends Religious Services:   . Active Member of Clubs or Organizations:   . Attends Archivist Meetings:   Marland Kitchen Marital Status:   Intimate Partner Violence:   . Fear of Current or Ex-Partner:   . Emotionally Abused:   Marland Kitchen  Physically Abused:   . Sexually Abused:     Outpatient Medications Prior to Visit  Medication Sig Dispense Refill  . cyanocobalamin (,VITAMIN B-12,) 1000 MCG/ML injection 1000 mcg (1 mg) injection once per week for four weeks, followed by 1000 mcg injection once per month. 1 mL 15  . hydrocortisone 2.5 % cream Apply twice daily to affected area with rash, for up to 1 to 2 weeks only 30 g 3  . ibuprofen (ADVIL) 800 MG tablet Take 1 tablet (800 mg total) by mouth every 8 (eight) hours as needed. 40 tablet 0  . ketoconazole (NIZORAL) 2 % cream Apply twice daily to affected area with rash, for up  to 1 to 2 weeks only 60 g 11  . lisinopril (PRINIVIL,ZESTRIL) 10 MG tablet Take 10 mg by mouth every morning.     . metFORMIN (GLUCOPHAGE) 500 MG tablet Take 1,000 mg by mouth daily with breakfast.   1  . methocarbamol (ROBAXIN) 750 MG tablet Take 1 tablet (750 mg total) by mouth every 8 (eight) hours as needed for muscle spasms. 90 tablet 1  . pregabalin (LYRICA) 100 MG capsule Take 1 capsule (100 mg total) by mouth 2 (two) times daily. 60 capsule 3  . triamcinolone cream (KENALOG) 0.1 % Apply to affected areas where itchy rash twice daily until rash is gone,  stop when gone. Avoid Face, groin, and underarms 453 g 2   No facility-administered medications prior to visit.    Allergies  Allergen Reactions  . Codeine Nausea And Vomiting    ROS Review of Systems  Constitutional: Negative.   HENT: Negative.   Eyes: Negative.   Respiratory: Negative.   Cardiovascular: Positive for leg swelling.       Sometimes lower feet swells.   Gastrointestinal: Negative.   Endocrine: Negative.   Genitourinary: Negative.   Musculoskeletal: Negative.   Neurological: Negative.   Hematological: Negative.   Psychiatric/Behavioral: Negative.       Objective:    Physical Exam Vitals reviewed.  Constitutional:      Appearance: Normal appearance.  HENT:     Head: Normocephalic.  Cardiovascular:     Rate  and Rhythm: Normal rate and regular rhythm.     Pulses: Normal pulses.     Heart sounds: Normal heart sounds.  Pulmonary:     Effort: Pulmonary effort is normal.     Breath sounds: Normal breath sounds.  Abdominal:     Palpations: Abdomen is soft.     Tenderness: There is no abdominal tenderness.  Musculoskeletal:        General: Normal range of motion.     Cervical back: Normal range of motion and neck supple.  Skin:    General: Skin is warm and dry.  Neurological:     General: No focal deficit present.     Mental Status: She is alert and oriented to person, place, and time.  Psychiatric:        Mood and Affect: Mood normal.        Behavior: Behavior normal.     BP 124/82   Pulse 83   Temp 98 F (36.7 C)   Wt 193 lb (87.5 kg)   LMP 11/26/2017   SpO2 98%   BMI 35.30 kg/m  Wt Readings from Last 3 Encounters:  11/13/19 193 lb (87.5 kg)  09/09/19 186 lb (84.4 kg)  08/19/19 189 lb 1.6 oz (85.8 kg)     Health Maintenance Due  Topic Date Due  . PNEUMOCOCCAL POLYSACCHARIDE VACCINE AGE 80-64 HIGH RISK  Never done  . FOOT EXAM  Never done  . OPHTHALMOLOGY EXAM  Never done  . HIV Screening  Never done  . PAP SMEAR-Modifier  Never done    There are no preventive care reminders to display for this patient.  Lab Results  Component Value Date   TSH 2.48 08/07/2019   Lab Results  Component Value Date   WBC 10.5 11/13/2019   HGB 12.6 11/13/2019   HCT 36.3 11/13/2019   MCV 83.9 11/13/2019   PLT 237.0 11/13/2019   Lab Results  Component Value Date   NA 138 11/13/2019   K 4.5 11/13/2019   CO2  28 11/13/2019   GLUCOSE 141 (H) 11/13/2019   BUN 13 11/13/2019   CREATININE 1.06 11/13/2019   BILITOT 0.5 08/07/2019   ALKPHOS 90 08/07/2019   AST 22 08/07/2019   ALT 29 08/07/2019   PROT 6.9 08/07/2019   ALBUMIN 4.1 08/07/2019   CALCIUM 9.6 11/13/2019   ANIONGAP 10 07/03/2019   GFR 55.67 (L) 11/13/2019   Lab Results  Component Value Date   CHOL 161 11/13/2019    Lab Results  Component Value Date   HDL 31.30 (L) 11/13/2019   No results found for: [facility] Lab Results  Component Value Date   TRIG 279.0 (H) 11/13/2019   Lab Results  Component Value Date   CHOLHDL 5 11/13/2019   Lab Results  Component Value Date   HGBA1C 6.9 (H) 11/13/2019      Assessment & Plan:   Problem List Items Addressed This Visit      Endocrine   DM (diabetes mellitus) ([place]) - Primary   Relevant Orders   HgB A1c (Completed)   Lipid Profile (Completed)   Basic Metabolic Panel (BMET) (Completed)     Other   Anemia   Relevant Orders   CBC with Differential/Platelet (Completed)   Bilateral leg pain   Relevant Orders   Ambulatory referral to Vascular Surgery    Other Visit Diagnoses    B12 deficiency       Relevant Orders   B12 and Folate Panel (Completed)   Leg cramps       Relevant Orders   Ambulatory referral to Vascular Surgery      No orders of the defined types were placed in this encounter. To  the lab today.   Begin Magnesium over the counter - 400 mg daily for leg cramps. Try for a few weeks.   I placed a Vascular referral for your leg/feet concerns.   Increase metformin to 1000 mg twice daily. Check blood sugars like you are doing.   Follow diabetic diet. Weight loss, exercise walking.   Office visit in 2 mos.    Follow-up: Return in about 2 months (around 01/14/2020).   This visit occurred during the SARS-CoV-2 public health emergency.  Safety protocols were in place, including screening questions prior to the visit, additional usage of staff PPE, and extensive cleaning of exam room while observing appropriate contact time as indicated for disinfecting solutions.    Denice Paradise, NP

## 2019-11-13 NOTE — Patient Instructions (Signed)
To  the lab today.   Begin Magnesium over the counter - 400 mg daily for leg cramps. Try for a few weeks.   I placed a Vascular referral for your leg/feet concerns.   Increase metformin to 1000 mg twice daily. Check blood sugars like you are doing.   Follow diabetic diet. Weight loss, exercise walking.   Office visit in 2 mos.

## 2019-11-14 ENCOUNTER — Encounter: Payer: Self-pay | Admitting: Nurse Practitioner

## 2019-11-14 DIAGNOSIS — M79604 Pain in right leg: Secondary | ICD-10-CM

## 2019-11-16 ENCOUNTER — Encounter: Payer: Self-pay | Admitting: Nurse Practitioner

## 2019-11-19 ENCOUNTER — Encounter: Payer: Managed Care, Other (non HMO) | Admitting: Obstetrics and Gynecology

## 2019-11-19 ENCOUNTER — Telehealth: Payer: Self-pay

## 2019-11-19 NOTE — Telephone Encounter (Signed)
LMTCB for lab results.  

## 2019-11-19 NOTE — Progress Notes (Deleted)
Pt present for annual exam.  

## 2019-11-20 NOTE — Telephone Encounter (Signed)
Spoke with patient about results.

## 2019-11-21 ENCOUNTER — Emergency Department: Payer: Managed Care, Other (non HMO)

## 2019-11-21 ENCOUNTER — Emergency Department
Admission: EM | Admit: 2019-11-21 | Discharge: 2019-11-21 | Disposition: A | Discharge status: Home / Self Care | Payer: Managed Care, Other (non HMO) | Attending: Emergency Medicine | Admitting: Emergency Medicine

## 2019-11-21 DIAGNOSIS — E119 Type 2 diabetes mellitus without complications: Secondary | ICD-10-CM | POA: Insufficient documentation

## 2019-11-21 DIAGNOSIS — R102 Pelvic and perineal pain: Secondary | ICD-10-CM

## 2019-11-21 DIAGNOSIS — R112 Nausea with vomiting, unspecified: Secondary | ICD-10-CM | POA: Insufficient documentation

## 2019-11-21 DIAGNOSIS — Z79899 Other long term (current) drug therapy: Secondary | ICD-10-CM | POA: Insufficient documentation

## 2019-11-21 DIAGNOSIS — Z7984 Long term (current) use of oral hypoglycemic drugs: Secondary | ICD-10-CM | POA: Diagnosis not present

## 2019-11-21 DIAGNOSIS — Z8742 Personal history of other diseases of the female genital tract: Secondary | ICD-10-CM

## 2019-11-21 DIAGNOSIS — R9389 Abnormal findings on diagnostic imaging of other specified body structures: Secondary | ICD-10-CM

## 2019-11-21 DIAGNOSIS — I10 Essential (primary) hypertension: Secondary | ICD-10-CM | POA: Diagnosis not present

## 2019-11-21 DIAGNOSIS — K219 Gastro-esophageal reflux disease without esophagitis: Secondary | ICD-10-CM | POA: Diagnosis not present

## 2019-11-21 DIAGNOSIS — R1010 Upper abdominal pain, unspecified: Secondary | ICD-10-CM

## 2019-11-21 DIAGNOSIS — R1011 Right upper quadrant pain: Secondary | ICD-10-CM | POA: Diagnosis present

## 2019-11-21 DIAGNOSIS — R109 Unspecified abdominal pain: Secondary | ICD-10-CM

## 2019-11-21 LAB — COMPREHENSIVE METABOLIC PANEL
ALT: 38 U/L (ref 0–44)
AST: 30 U/L (ref 15–41)
Albumin: 3.5 g/dL (ref 3.5–5.0)
Alkaline Phosphatase: 90 U/L (ref 38–126)
Anion gap: 9 (ref 5–15)
BUN: 14 mg/dL (ref 6–20)
CO2: 26 mmol/L (ref 22–32)
Calcium: 9 mg/dL (ref 8.9–10.3)
Chloride: 101 mmol/L (ref 98–111)
Creatinine, Ser: 1.04 mg/dL — ABNORMAL HIGH (ref 0.44–1.00)
GFR calc Af Amer: >60 mL/min (ref >60)
GFR calc non Af Amer: >60 mL/min (ref >60)
Glucose, Bld: 181 mg/dL — ABNORMAL HIGH (ref 70–99)
Potassium: 4.3 mmol/L (ref 3.5–5.1)
Sodium: 136 mmol/L (ref 135–145)
Total Bilirubin: 0.7 mg/dL (ref 0.3–1.2)
Total Protein: 7 g/dL (ref 6.5–8.1)

## 2019-11-21 LAB — URINALYSIS, COMPLETE (UACMP) WITH MICROSCOPIC
Bilirubin Urine: NEGATIVE
Glucose, UA: NEGATIVE mg/dL
Hgb urine dipstick: NEGATIVE
Ketones, ur: NEGATIVE mg/dL
Leukocytes,Ua: NEGATIVE
Nitrite: NEGATIVE
Protein, ur: NEGATIVE mg/dL
Specific Gravity, Urine: 1.012 (ref 1.005–1.030)
pH: 5 (ref 5.0–8.0)

## 2019-11-21 LAB — CBC
HCT: 35.2 % — ABNORMAL LOW (ref 36.0–46.0)
Hemoglobin: 12.3 g/dL (ref 12.0–15.0)
MCH: 28.9 pg (ref 26.0–34.0)
MCHC: 34.9 g/dL (ref 30.0–36.0)
MCV: 82.6 fL (ref 80.0–100.0)
Platelets: 220 10*3/uL (ref 150–400)
RBC: 4.26 MIL/uL (ref 3.87–5.11)
RDW: 13.3 % (ref 11.5–15.5)
WBC: 8.8 10*3/uL (ref 4.0–10.5)
nRBC: 0 % (ref 0.0–0.2)

## 2019-11-21 LAB — PREGNANCY, URINE: Preg Test, Ur: NEGATIVE

## 2019-11-21 LAB — LIPASE, BLOOD: Lipase: 34 U/L (ref 11–51)

## 2019-11-21 MED ORDER — ONDANSETRON HCL 4 MG/2ML IJ SOLN
4.0000 mg | Freq: Once | INTRAMUSCULAR | Status: AC
  Administered 2019-11-21: 4 mg via INTRAVENOUS
  Filled 2019-11-21: qty 2

## 2019-11-21 MED ORDER — SUCRALFATE 1 G PO TABS
1.0000 g | ORAL_TABLET | Freq: Four times a day (QID) | ORAL | 0 refills | Status: DC
Start: 2019-11-21 — End: 2020-08-12

## 2019-11-21 MED ORDER — KETOROLAC TROMETHAMINE 30 MG/ML IJ SOLN
30.0000 mg | Freq: Once | INTRAMUSCULAR | Status: AC
  Administered 2019-11-21: 30 mg via INTRAVENOUS
  Filled 2019-11-21: qty 1

## 2019-11-21 MED ORDER — IOHEXOL 300 MG/ML  SOLN
100.0000 mL | Freq: Once | INTRAMUSCULAR | Status: AC | PRN
  Administered 2019-11-21: 100 mL via INTRAVENOUS

## 2019-11-21 MED ORDER — PANTOPRAZOLE SODIUM 20 MG PO TBEC
20.0000 mg | DELAYED_RELEASE_TABLET | Freq: Every day | ORAL | 1 refills | Status: DC
Start: 2019-11-21 — End: 2020-08-12

## 2019-11-21 NOTE — ED Notes (Signed)
[place] notified of negative preg test, states en route to get patient. Pt updated at this time.

## 2019-11-21 NOTE — ED Notes (Signed)
US at bedside at this time 

## 2019-11-21 NOTE — ED Notes (Signed)
Pt up to bathroom without assistance, back to bed without incident. UA collected by this RN. EDP updated patient regarding plan of care. Call bell remains within reach of patient at this time, denies further needs.

## 2019-11-21 NOTE — ED Notes (Signed)
Pt transported to CT at this time.

## 2019-11-21 NOTE — ED Notes (Signed)
[place] at bedside to take patient to [place].

## 2019-11-21 NOTE — ED Provider Notes (Signed)
[facility] Emergency Department Provider Note   ____________________________________________    I have reviewed the triage vital signs and the nursing notes.   HISTORY  Chief Complaint Abdominal Pain, Nausea, and Emesis     HPI Sherry Hobbs is a 49 y.o. female with history of diabetes who presents with complaints of right upper quadrant abdominal pain nausea and vomiting which started overnight.  Patient reports she woke up around 3 AM with pain in her right upper quadrant, she reports it is moderate without radiation.  It made her nauseated and she did vomit once.  Nonbilious nonbloody.  She reports he had a similar episode last week after eating a heavy meal.  Has a history of a hysterectomy, no other abdominal surgeries.  No fevers or chills.  Normal stools.  Has not taken anything for this  Past Medical History:  Diagnosis Date   Anemia    problems when having abnmornal uterine bleeding.   Arthritis    lower back   Depression    Diabetes mellitus without complication ([place])    DM (diabetes mellitus) ([place]) 08/07/2019   Elevated serum creatinine    GERD (gastroesophageal reflux disease)    OCC  NO MEDS   Headache    occasional migraines   History of blood transfusion 05/02/2016   due to abnormal uterine bleeding   HTN (hypertension) 08/07/2019   Hypertension    Neuropathy    feet    Patient Active Problem List   Diagnosis Date Noted   Abnormal uterine bleeding (AUB) 10/07/2019   Burning sensation of feet 09/23/2019   DM (diabetes mellitus) ([place]) 08/07/2019   HTN (hypertension) 08/07/2019   GERD (gastroesophageal reflux disease) 08/07/2019   S/P laparoscopic assisted vaginal hysterectomy (LAVH) 07/07/2019   History of lumbar surgery (L4-L5 laminectomy for cyst removal Dr Lacinda Axon 06/2018) 02/25/2019   Chronic radicular lumbar pain (left) 02/25/2019   Lumbar radiculopathy (left) 02/25/2019   Lumbar facet arthropathy  02/25/2019   Chronic pain syndrome 02/25/2019   Bilateral leg pain 11/20/2018   Left hip pain 11/20/2018   Low back pain 11/20/2018   History of blood transfusion 05/02/2016   DUB (dysfunctional uterine bleeding) 03/31/2016   Ovarian cyst 03/31/2016   Anemia 03/30/2016    Past Surgical History:  Procedure Laterality Date   ABDOMINAL HYSTERECTOMY     BACK SURGERY     DILATION AND CURETTAGE OF UTERUS  05/03/2016   Procedure: DILATATION AND CURETTAGE;  Surgeon: [name], MD;  Location: [facility] ORS;  Service: Gynecology;;   DILITATION & CURRETTAGE/HYSTROSCOPY WITH NOVASURE ABLATION N/A 12/03/2017   Procedure: DILATATION & CURETTAGE/HYSTEROSCOPY WITH MINERVA ABLATION;  Surgeon: [name], MD;  Location: [facility] ORS;  Service: Gynecology;  Laterality: N/A;  MINERVA REP CONTACTED [facility] Derl Pousson [phone])   LAPAROSCOPIC ASSISTED VAGINAL HYSTERECTOMY N/A 07/07/2019   Procedure: LAPAROSCOPIC ASSISTED VAGINAL HYSTERECTOMY;  Surgeon: [name], MD;  Location: [facility] ORS;  Service: Gynecology;  Laterality: N/A;  possible TVH   LAPAROSCOPIC BILATERAL SALPINGECTOMY Bilateral 07/07/2019   Procedure: LAPAROSCOPIC BILATERAL SALPINGECTOMY;  Surgeon: [name], MD;  Location: [facility] OR[facility];  Service: Gynecology;  Laterality: Bilateral;   LUMBAR LAMINECTOMY/DECOMPRESSION MICRODISCECTOMY Left 06/24/2018   Procedure: LUMBAR HEMILAMINECTOMY, FORAMINOTOMY, CYST REMOVAL - 1 LEVEL LEFT L4-5;  Surgeon: [name], MD;  Location: [facility] OR[facility];  Service: Neurosurgery;  Laterality: Left;    Prior to Admission medications   Medication Sig Start Date End Date Taking? Authorizing Provider  cyanocobalamin (,VITAMIN B-12,) 1000 MCG/ML injection 1000  mcg (1 mg) injection once per week for four weeks, followed by 1000 mcg injection once per month. 08/07/19   Marval Regal, NP  hydrocortisone 2.5 % cream Apply twice daily to affected area with rash, for up to 1 to 2 weeks only 08/29/19   Moye, [place], MD    ibuprofen (ADVIL) 800 MG tablet Take 1 tablet (800 mg total) by mouth every 8 (eight) hours as needed. 07/08/19   [name], MD  ketoconazole (NIZORAL) 2 % cream Apply twice daily to affected area with rash, for up to 1 to 2 weeks only 08/29/19   Moye, [place], MD  lisinopril (PRINIVIL,ZESTRIL) 10 MG tablet Take 10 mg by mouth every morning.     [provider]  metFORMIN (GLUCOPHAGE) 500 MG tablet Take 1,000 mg by mouth daily with breakfast.  10/12/17   [provider]  methocarbamol (ROBAXIN) 750 MG tablet Take 1 tablet (750 mg total) by mouth every 8 (eight) hours as needed for muscle spasms. 03/03/19   Gillis Santa, MD  pregabalin (LYRICA) 100 MG capsule Take 1 capsule (100 mg total) by mouth 2 (two) times daily. 09/18/19   Gillis Santa, MD  triamcinolone cream (KENALOG) 0.1 % Apply to affected areas where itchy rash twice daily until rash is gone,  stop when gone. Avoid Face, groin, and underarms 08/29/19   Moye, [place], MD     Allergies Codeine  Family History  Problem Relation Age of Onset   Diabetes Mother    COPD Mother    Emphysema Mother    Osteoporosis Mother    Lung cancer Mother 32   Breast cancer Mother    CAD Father    Diabetes Sister    Diabetes Brother    CAD Brother    Celiac disease Brother    Renal cancer Maternal Uncle        1 uncle   Brain cancer Maternal Uncle        different uncle   Diabetes Maternal Grandmother    Stroke Maternal Grandmother    Bladder Cancer Maternal Grandfather    Diabetes Brother    Heart disease Brother    Cancer Sister    [place] Sister     Social History Social History   Tobacco Use   Smoking status: Never Smoker   Smokeless tobacco: Never Used  Scientific laboratory technician Use: Never used  Substance Use Topics   Alcohol use: No   Drug use: No    Review of Systems  Constitutional: No fever/chills Eyes: No visual changes.  ENT: No sore throat. Cardiovascular: Denies chest  pain. Respiratory: Denies shortness of breath. Gastrointestinal: As above Genitourinary: Negative for dysuria.  No hematuria Musculoskeletal: Negative for back pain. Skin: Negative for rash. Neurological: Negative for headaches   ____________________________________________   PHYSICAL EXAM:  VITAL SIGNS: ED Triage Vitals  Enc Vitals Group     BP 11/21/19 0730 (!) 172/89     Pulse Rate 11/21/19 0730 76     Resp 11/21/19 0729 16     Temp 11/21/19 0729 97.7 F (36.5 C)     Temp Source 11/21/19 0729 Oral     SpO2 11/21/19 0730 99 %     Weight 11/21/19 0731 88.5 kg (195 lb)     Height 11/21/19 0731 1.575 m (5\' 2" )     Head Circumference --      Peak Flow --      Pain Score 11/21/19 0730 8  Pain Loc --      Pain Edu? --      Excl. in [place]? --     Constitutional: Alert and oriented.   Nose: No congestion/rhinnorhea. Mouth/Throat: Mucous membranes are moist.    Cardiovascular: Normal rate, regular rhythm.   Good peripheral circulation. Respiratory: Normal respiratory effort.  No retractions.. Gastrointestinal: Soft, tenderness palpation the right upper quadrant no distention.  No CVA tenderness.  Musculoskeletal:  Warm and well perfused Neurologic:  Normal speech and language. No gross focal neurologic deficits are appreciated.  Skin:  Skin is warm, dry and intact. No rash noted. Psychiatric: Mood and affect are normal. Speech and behavior are normal.  ____________________________________________   LABS (all labs ordered are listed, but only abnormal results are displayed)  Labs Reviewed  CBC  COMPREHENSIVE METABOLIC PANEL  LIPASE, BLOOD  URINALYSIS, COMPLETE (UACMP) WITH MICROSCOPIC   ____________________________________________  EKG  None ____________________________________________  RADIOLOGY  Ultrasound right upper quadrant demonstrates hepatic steatosis, discussed with patient the need for follow-up with [place].  CT scan demonstrates bilateral possibly  septated ovarian cysts  Ultrasound pelvis ____________________________________________   PROCEDURES  Procedure(s) performed: No  Procedures   Critical Care performed: No ____________________________________________   INITIAL IMPRESSION / ASSESSMENT AND PLAN / ED COURSE  Pertinent labs & imaging results that were available during my care of the patient were reviewed by me and considered in my medical decision making (see chart for details).  Patient presents with right upper quadrant abdominal pain as detailed above.  Differential includes cholelithiasis, cholecystitis, pancreatitis, gastritis, constipation.  Suspicious for cholecystitis versus cholelithiasis given location, onset after heavy meal.  Will obtain labs, give IV Toradol, IV Zofran, obtain ultrasound of the right upper quadrant and reevaluate.  Patient with improvement in pain after IV Toradol.  Ultrasound negative for cholecystitis, discussed steatosis with the patient.  LFTs are normal.  Will proceed with CT abdomen pelvis.  CT demonstrates likely ovarian cyst, patient is a history of hysterectomy.  We will proceed with ultrasound of the pelvis/ovaries  Ultrasound pelvis demonstrates simple cyst bilaterally, patient is feeling significantly improved.  We will start her on Protonix and Carafate in case there is a component of gastritis/duodenitis and will have her follow-up with GI.  Return precautions discussed.    ____________________________________________   FINAL CLINICAL IMPRESSION(S) / ED DIAGNOSES  Final diagnoses:  Upper abdominal pain        Note:  This document was prepared using Dragon voice recognition software and may include unintentional dictation errors.   Lavonia Drafts, MD 11/21/19 1345

## 2019-11-21 NOTE — ED Triage Notes (Signed)
Pt reports pain to her RLQ since last pm and an episode of NV. Pt reports had an episode this weekend as well but it went away.

## 2019-11-21 NOTE — ED Notes (Signed)
VORB from Dr. Corky Downs for add on urine pregnancy due to [place] stating they have to have negative pregnancy test despite hysterectomy per radiology protocol.

## 2019-11-26 ENCOUNTER — Encounter: Payer: Self-pay | Admitting: Nurse Practitioner

## 2019-11-26 ENCOUNTER — Encounter: Payer: Self-pay | Admitting: Obstetrics and Gynecology

## 2019-11-26 DIAGNOSIS — R300 Dysuria: Secondary | ICD-10-CM

## 2019-11-27 ENCOUNTER — Encounter: Payer: Self-pay | Admitting: Nurse Practitioner

## 2019-11-28 ENCOUNTER — Other Ambulatory Visit
Admission: RE | Admit: 2019-11-28 | Discharge: 2019-11-28 | Disposition: A | Discharge status: Home / Self Care | Payer: Managed Care, Other (non HMO) | Attending: Nurse Practitioner | Admitting: Nurse Practitioner

## 2019-11-28 ENCOUNTER — Other Ambulatory Visit: Payer: Self-pay

## 2019-11-28 ENCOUNTER — Telehealth (INDEPENDENT_AMBULATORY_CARE_PROVIDER_SITE_OTHER): Payer: Managed Care, Other (non HMO) | Admitting: Nurse Practitioner

## 2019-11-28 DIAGNOSIS — R0609 Other forms of dyspnea: Secondary | ICD-10-CM

## 2019-11-28 DIAGNOSIS — R300 Dysuria: Secondary | ICD-10-CM | POA: Insufficient documentation

## 2019-11-28 DIAGNOSIS — R0600 Dyspnea, unspecified: Secondary | ICD-10-CM | POA: Diagnosis not present

## 2019-11-28 LAB — URINALYSIS, ROUTINE W REFLEX MICROSCOPIC
Bilirubin Urine: NEGATIVE
Glucose, UA: NEGATIVE mg/dL
Hgb urine dipstick: NEGATIVE
Ketones, ur: NEGATIVE mg/dL
Leukocytes,Ua: NEGATIVE
Nitrite: NEGATIVE
Protein, ur: NEGATIVE mg/dL
Specific Gravity, Urine: 1.015 (ref 1.005–1.030)
pH: 5 (ref 5.0–8.0)

## 2019-11-28 MED ORDER — LISINOPRIL 10 MG PO TABS: 10.0000 mg | ORAL_TABLET | ORAL | 1 refills | Status: DC

## 2019-11-28 MED ORDER — METFORMIN HCL 500 MG PO TABS: 1000.0000 mg | ORAL_TABLET | Freq: Two times a day (BID) | ORAL | 1 refills | Status: DC

## 2019-11-28 NOTE — Progress Notes (Signed)
Virtual Visit via Video Note  This visit type was conducted due to national recommendations for restrictions regarding the COVID-19 pandemic (e.g. social distancing).  This format is felt to be most appropriate for this patient at this time.  All issues noted in this document were discussed and addressed.  No physical exam was performed (except for noted visual exam findings with Video Visits).   I connected with@ on 11/28/2019 at  4:00 PM EDT by a video enabled telemedicine application or telephone and verified that I am speaking with the correct person using two identifiers. Location patient: home Location provider: work or home office Persons participating in the virtual visit: patient, provider  I discussed the limitations, risks, security and privacy concerns of performing an evaluation and management service by telephone and the availability of in person appointments. I also discussed with the patient that there may be a patient responsible charge related to this service. The patient expressed understanding and agreed to proceed.  Reason for visit: Pelvic pressure and dysuria, r/o UTI.   HPI: This 49 year old patient with past medical history of hypertension, GERD, diabetes mellitus, anemia with DUB, woke up at 3:00 in the morning on 11/21/2019 with RUQ pain described as sharp pain in the lower rib area.  She also had nausea and vomited once.  Non bilious and non bloody.  She has similar episode the week prior after eating a heavy meal.  She had no diarrhea, blood or melena stool, fevers or chills. Afebrile.  She had tenderness right upper quadrant and underwent  a RUQ  [place] that  showed hepatic steatosis, negative for cholecystitis.  LFT's normal.  CT showed bilateral possible septated ovarian cyst.  Pelvic ultrasound showed no worrisome cyst and she was told they are likely to resolve on their own.  She has a GYN appointment arranged.  She was started on Carafate and Protonix in case there was  component of gastritis duodenitis and advised to follow-up with GI.  Labs: Urine pregnancy negative, UA with rare bacteria, CBC with WBC 8.8, hemoglobin 12.3, c-Met creatinine 1.04 glucose 181, lipase 34.  Currently, she is feeling much better has had no further abdominal pain.  She did telephone and reporting pressure and pain with urination.  She has had no hematuria, flank pain, fevers or chills.  No nausea or vomiting.  No vaginal discharge.  She is wondering about a UTI.  She has completed a UA that was unremarkable and urine culture is pending.  ROS: A1c 6.9- target. She is on Metformin 1000 mg twice daily and tolerating well. No diarrhea. She did try magnesium for couple weeks for leg cramps made her heart race, and gave her palpitations.  She would like to start exercising but she has shortness of breath with exertion.  No chest pressure, heaviness or pain.  We discussed cardiac clearance so that she will feel comfortable to exercise.  Weight management is important for her.   Past Medical History:  Diagnosis Date  . Anemia    problems when having abnmornal uterine bleeding.  . Arthritis    lower back  . Depression   . Diabetes mellitus without complication ([place])   . DM (diabetes mellitus) ([place]) 08/07/2019  . Elevated serum creatinine   . GERD (gastroesophageal reflux disease)    OCC  NO MEDS  . Headache    occasional migraines  . History of blood transfusion 05/02/2016   due to abnormal uterine bleeding  . HTN (hypertension) 08/07/2019  . Hypertension   .  Neuropathy    feet    Past Surgical History:  Procedure Laterality Date  . ABDOMINAL HYSTERECTOMY    . BACK SURGERY    . DILATION AND CURETTAGE OF UTERUS  05/03/2016   Procedure: DILATATION AND CURETTAGE;  Surgeon: [name], MD;  Location: [facility];  Service: Gynecology;;  . Murrell Redden & CURRETTAGE/HYSTROSCOPY WITH NOVASURE ABLATION N/A 12/03/2017   Procedure: DILATATION & CURETTAGE/HYSTEROSCOPY WITH MINERVA ABLATION;   Surgeon: [name], MD;  Location: [facility];  Service: Gynecology;  Laterality: N/A;  MINERVA REP CONTACTED [facility] [phone])  . LAPAROSCOPIC ASSISTED VAGINAL HYSTERECTOMY N/A 07/07/2019   Procedure: LAPAROSCOPIC ASSISTED VAGINAL HYSTERECTOMY;  Surgeon: [name], MD;  Location: [facility];  Service: Gynecology;  Laterality: N/A;  possible TVH  . LAPAROSCOPIC BILATERAL SALPINGECTOMY Bilateral 07/07/2019   Procedure: LAPAROSCOPIC BILATERAL SALPINGECTOMY;  Surgeon: [name], MD;  Location: [facility];  Service: Gynecology;  Laterality: Bilateral;  . LUMBAR LAMINECTOMY/DECOMPRESSION MICRODISCECTOMY Left 06/24/2018   Procedure: LUMBAR HEMILAMINECTOMY, FORAMINOTOMY, CYST REMOVAL - 1 LEVEL LEFT L4-5;  Surgeon: [name], MD;  Location: [facility];  Service: Neurosurgery;  Laterality: Left;    Family History  Problem Relation Age of Onset  . Diabetes Mother   . COPD Mother   . Emphysema Mother   . Osteoporosis Mother   . Lung cancer Mother 83  . Breast cancer Mother   . CAD Father   . Diabetes Sister   . Diabetes Brother   . CAD Brother   . Celiac disease Brother   . Renal cancer Maternal Uncle        1 uncle  . Brain cancer Maternal Uncle        different uncle  . Diabetes Maternal Grandmother   . Stroke Maternal Grandmother   . Bladder Cancer Maternal Grandfather   . Diabetes Brother   . Heart disease Brother   . Cancer Sister   . Nevi Sister     SOCIAL HX: non smoker   Current Outpatient Medications:  .  cholecalciferol (VITAMIN D3) 25 MCG (1000 UNIT) tablet, Take 1,000 Units by mouth daily., Disp: , Rfl:  .  cyanocobalamin (,VITAMIN B-12,) 1000 MCG/ML injection, 1000 mcg (1 mg) injection once per week for four weeks, followed by 1000 mcg injection once per month. (Patient taking differently: Inject 1,000 mcg into the muscle every 30 (thirty) days. ), Disp: 1 mL, Rfl: 15 .  docusate sodium (COLACE) 100 MG capsule, Take 100 mg by mouth 2 (two) times daily as needed  for mild constipation., Disp: , Rfl:  .  hydrocortisone 2.5 % cream, Apply twice daily to affected area with rash, for up to 1 to 2 weeks only, Disp: 30 g, Rfl: 3 .  ibuprofen (ADVIL) 800 MG tablet, Take 1 tablet (800 mg total) by mouth every 8 (eight) hours as needed., Disp: 40 tablet, Rfl: 0 .  ketoconazole (NIZORAL) 2 % cream, Apply twice daily to affected area with rash, for up to 1 to 2 weeks only, Disp: 60 g, Rfl: 11 .  lisinopril (ZESTRIL) 10 MG tablet, Take 1 tablet (10 mg total) by mouth every morning., Disp: 90 tablet, Rfl: 1 .  metFORMIN (GLUCOPHAGE) 500 MG tablet, Take 2 tablets (1,000 mg total) by mouth 2 (two) times daily with a meal., Disp: 180 tablet, Rfl: 1 .  methocarbamol (ROBAXIN) 750 MG tablet, Take 1 tablet (750 mg total) by mouth every 8 (eight) hours as needed for muscle spasms., Disp: 90 tablet, Rfl: 1 .  pantoprazole (PROTONIX)  20 MG tablet, Take 1 tablet (20 mg total) by mouth daily., Disp: 30 tablet, Rfl: 1 .  pregabalin (LYRICA) 50 MG capsule, Take 50 mg by mouth 3 (three) times daily., Disp: , Rfl:  .  sucralfate (CARAFATE) 1 g tablet, Take 1 tablet (1 g total) by mouth 4 (four) times daily for 15 days., Disp: 60 tablet, Rfl: 0 .  triamcinolone cream (KENALOG) 0.1 %, Apply to affected areas where itchy rash twice daily until rash is gone,  stop when gone. Avoid Face, groin, and underarms, Disp: 453 g, Rfl: 2  EXAM:  VITALS per patient if applicable:  GENERAL: alert, oriented, appears well and in no acute distress  HEENT: atraumatic, conjunctiva clear, no obvious abnormalities on inspection of external nose and ears  NECK: normal movements of the head and neck  LUNGS: on inspection no signs of respiratory distress, breathing rate appears normal, no obvious gross SOB, gasping or wheezing  CV: no obvious cyanosis  MS: moves all visible extremities without noticeable abnormality  PSYCH/NEURO: pleasant and cooperative, no obvious depression or anxiety, speech and  thought processing grossly intact  ASSESSMENT AND PLAN:  Discussed the following assessment and plan:  DOE (dyspnea on exertion) - Plan: Ambulatory referral to Cardiology  Dysuria  No problem-specific Assessment & Plan notes found for this encounter.  She was triaged to urinalysis that is unremarkable.  Urine culture is pending at time of dictation culture returned negative.  Patient's concern was with her DOE.  She is requesting referral to cardiology so she can exercise.  Plan: Referral to cardiology placed follow-up office visit in 1 month.  I discussed the assessment and treatment plan with the patient. The patient was provided an opportunity to ask questions and all were answered. The patient agreed with the plan and demonstrated an understanding of the instructions.   The patient was advised to call back or seek an in-person evaluation if the symptoms worsen or if the condition fails to improve as anticipated.  Denice Paradise, NP Adult Nurse Practitioner [place] [phone]

## 2019-11-28 NOTE — Patient Instructions (Addendum)
Office visit in 1 month  Cardiac referral.  Call if can't get in to Dr. Carlis Abbott.

## 2019-11-28 NOTE — Telephone Encounter (Signed)
Pt did increase her metformin to 1,000 mg BID. Her blood sugars are ranging 169-232 (not all are fasting). She has forgot to take the night time dose a couple of times.  She does not have an appt with vascular- Dr Carlis Abbott yet  Regarding ED visit, she was seen for pelvic pain, pressure, abdominal pain. Urine showed bacteria but no culture was ran. I have placed orders for another [facility] and [facility]. She is going around lunch time to give sample and then doing a virtual with you at 4:00 today to discuss ED visit.

## 2019-11-28 NOTE — Telephone Encounter (Signed)
See other message

## 2019-11-29 LAB — URINE CULTURE: Culture: NO GROWTH

## 2019-12-01 ENCOUNTER — Encounter: Payer: Self-pay | Admitting: Nurse Practitioner

## 2019-12-01 DIAGNOSIS — R0609 Other forms of dyspnea: Secondary | ICD-10-CM | POA: Insufficient documentation

## 2019-12-01 DIAGNOSIS — R300 Dysuria: Secondary | ICD-10-CM

## 2019-12-01 DIAGNOSIS — R0600 Dyspnea, unspecified: Secondary | ICD-10-CM | POA: Insufficient documentation

## 2019-12-01 HISTORY — DX: Dysuria: R30.0

## 2019-12-04 ENCOUNTER — Encounter (INDEPENDENT_AMBULATORY_CARE_PROVIDER_SITE_OTHER): Payer: Self-pay | Admitting: Vascular Surgery

## 2019-12-08 ENCOUNTER — Other Ambulatory Visit: Payer: Self-pay

## 2019-12-08 ENCOUNTER — Ambulatory Visit (INDEPENDENT_AMBULATORY_CARE_PROVIDER_SITE_OTHER): Payer: Managed Care, Other (non HMO) | Admitting: Cardiology

## 2019-12-08 ENCOUNTER — Encounter: Payer: Self-pay | Admitting: Cardiology

## 2019-12-08 VITALS — BP 122/74 | HR 80 | Ht 62.0 in | Wt 192.0 lb

## 2019-12-08 DIAGNOSIS — R079 Chest pain, unspecified: Secondary | ICD-10-CM | POA: Diagnosis not present

## 2019-12-08 DIAGNOSIS — R072 Precordial pain: Secondary | ICD-10-CM

## 2019-12-08 DIAGNOSIS — R0600 Dyspnea, unspecified: Secondary | ICD-10-CM

## 2019-12-08 DIAGNOSIS — I10 Essential (primary) hypertension: Secondary | ICD-10-CM | POA: Diagnosis not present

## 2019-12-08 DIAGNOSIS — R0609 Other forms of dyspnea: Secondary | ICD-10-CM

## 2019-12-08 MED ORDER — METOPROLOL TARTRATE 100 MG PO TABS
ORAL_TABLET | ORAL | 0 refills | Status: DC
Start: 2019-12-08 — End: 2020-01-15

## 2019-12-08 NOTE — Patient Instructions (Signed)
Medication Instructions:  Your physician recommends that you continue on your current medications as directed. Please refer to the Current Medication list given to you today.  A one time dose of Metoprolol 100 mg has been sent to your pharmacy to be taken 2 hours prior to the Cardiac CT  *If you need a refill on your cardiac medications before your next appointment, please call your pharmacy*   Lab Work: Your physician recommends that you return for lab work a few days prior to the CT.  Please have your lab (Bmet) drawn at the [facility] medical mall lab. You do not need an appointment. Lab hours are Mon-Fri 7am-6pm.   If you have labs (blood work) drawn today and your tests are completely normal, you will receive your results only by: Marland Kitchen MyChart Message (if you have MyChart) OR . A paper copy in the mail If you have any lab test that is abnormal or we need to change your treatment, we will call you to review the results.   Testing/Procedures: Your physician has requested that you have an echocardiogram. Echocardiography is a painless test that uses sound waves to create images of your heart. It provides your doctor with information about the size and shape of your heart and how well your heart's chambers and valves are working. This procedure takes approximately one hour. There are no restrictions for this procedure.  Your physician has requested that you have cardiac CT. Cardiac computed tomography (CT) is a painless test that uses an x-ray machine to take clear, detailed pictures of your heart. For further information please visit [email or web address].[email or web address]. Please follow instruction sheet as given.      Follow-Up: At [facility], you and your health needs are our priority.  As part of our continuing mission to provide you with exceptional heart care, we have created designated Provider Care Teams.  These Care Teams include your primary Cardiologist (physician) and Advanced Practice Providers  (APPs -  Physician Assistants and Nurse Practitioners) who all work together to provide you with the care you need, when you need it.  We recommend signing up for the patient portal called "MyChart".  Sign up information is provided on this After Visit Summary.  MyChart is used to connect with patients for Virtual Visits (Telemedicine).  Patients are able to view lab/test results, encounter notes, upcoming appointments, etc.  Non-urgent messages can be sent to your provider as well.   To learn more about what you can do with MyChart, go to [email or web address].    Your next appointment:   5 week(s)  The format for your next appointment:   In Person  Provider:    You may see  Dr. Garen Lah or one of the following Advanced Practice Providers on your designated Care Team:    Murray Hodgkins, NP  Christell Faith, PA-C  Marrianne Mood, PA-C    Other Instructions Your cardiac CT will be scheduled at one of the below locations:   [facility] [address] [place], [place] [address] [phone]  [place] [address] [place], [place] [address] [phone]  If scheduled at [facility], please arrive at the [facility] main entrance of [facility] 30 minutes prior to test start time. Proceed to the [facility] Radiology Department (first floor) to check-in and test prep.  If scheduled at [facility], please arrive 15 mins early for check-in and test prep.  Please follow  these instructions carefully (unless otherwise directed):    On the Night Before the Test: . Be sure to Drink plenty of water. . Do not consume any caffeinated/decaffeinated beverages or chocolate 12 hours prior to your test. . Do not take any antihistamines 12 hours prior to your test. I On the Day of the Test: . Drink plenty of water. Do not drink any water within one hour of the test. . Do  not eat any food 4 hours prior to the test. . You may take your regular medications prior to the test.  . Take metoprolol (Lopressor) two hours prior to test. . FEMALES- please wear underwire-free bra if available         After the Test: . Drink plenty of water. . After receiving IV contrast, you may experience a mild flushed feeling. This is normal. . On occasion, you may experience a mild rash up to 24 hours after the test. This is not dangerous. If this occurs, you can take Benadryl 25 mg and increase your fluid intake. . If you experience trouble breathing, this can be serious. If it is severe call 911 IMMEDIATELY. If it is mild, please call our office. . If you take any of these medications: Glipizide/Metformin, Avandament, Glucavance, please do not take 48 hours after completing test unless otherwise instructed.   Once we have confirmed authorization from your insurance company, we will call you to set up a date and time for your test. Based on how quickly your insurance processes prior authorizations requests, please allow up to 4 weeks to be contacted for scheduling your Cardiac CT appointment. Be advised that routine Cardiac CT appointments could be scheduled as many as 8 weeks after your provider has ordered it.  For non-scheduling related questions, please contact the cardiac imaging nurse navigator should you have any questions/concerns: Marchia Bond, Cardiac Imaging Nurse Navigator Burley Saver, Interim Cardiac Imaging Nurse [place] [facility] Services Direct Office Dial: [phone]   For scheduling needs, including cancellations and rescheduling, please call Vivien Rota at [phone], option 3.

## 2019-12-08 NOTE — Progress Notes (Signed)
Cardiology Office Note:    Date:  12/08/2019   ID:  Sherry Hobbs, DOB [birth year], MRN [id number]  PCP:  Marval Regal, NP  [facility] Cardiologist:  No primary care provider on file.  [facility] Electrophysiologist:  None   Referring MD: Marval Regal, NP   Chief Complaint  Patient presents with  . OTHER    DOE. Meds reviewed verbally with pt.   Sherry Hobbs is a 49 y.o. female who is being seen today for the evaluation of dyspnea on exertion, chest pain at the request of Marval Regal, NP.   History of Present Illness:    Sherry Hobbs is a 49 y.o. female with a hx of hypertension, diabetes who presents due to dyspnea on exertion.  Patient states having symptoms of shortness of breath over the past 5 months.  Symptoms of shortness of breath typically occur with exertion.  Symptoms have probably stayed the same.  Exertion also causes slight chest discomfort which patient calls pressure like.  She rates chest pressure has 2 out of 10 in severity.  She has a strong family history of CAD with her father passing at age 69 from a massive heart attack.  She has 2 brothers who both have MIs in their 38s.  She denies smoking.  Past Medical History:  Diagnosis Date  . Anemia    problems when having abnmornal uterine bleeding.  . Arthritis    lower back  . Depression   . Diabetes mellitus without complication ([place])   . DM (diabetes mellitus) ([place]) 08/07/2019  . Elevated serum creatinine   . GERD (gastroesophageal reflux disease)    OCC  NO MEDS  . Headache    occasional migraines  . History of blood transfusion 05/02/2016   due to abnormal uterine bleeding  . HTN (hypertension) 08/07/2019  . Hypertension   . Neuropathy    feet    Past Surgical History:  Procedure Laterality Date  . ABDOMINAL HYSTERECTOMY    . BACK SURGERY    . DILATION AND CURETTAGE OF UTERUS  05/03/2016   Procedure: DILATATION AND CURETTAGE;  Surgeon: [name], MD;  Location:  [facility] ORS;  Service: Gynecology;;  . Murrell Redden & CURRETTAGE/HYSTROSCOPY WITH NOVASURE ABLATION N/A 12/03/2017   Procedure: DILATATION & CURETTAGE/HYSTEROSCOPY WITH MINERVA ABLATION;  Surgeon: [name], MD;  Location: [facility] ORS;  Service: Gynecology;  Laterality: N/A;  MINERVA REP CONTACTED [facility] VUONG [phone])  . LAPAROSCOPIC ASSISTED VAGINAL HYSTERECTOMY N/A 07/07/2019   Procedure: LAPAROSCOPIC ASSISTED VAGINAL HYSTERECTOMY;  Surgeon: [name], MD;  Location: [facility] ORS;  Service: Gynecology;  Laterality: N/A;  possible TVH  . LAPAROSCOPIC BILATERAL SALPINGECTOMY Bilateral 07/07/2019   Procedure: LAPAROSCOPIC BILATERAL SALPINGECTOMY;  Surgeon: [name], MD;  Location: [facility] ORS;  Service: Gynecology;  Laterality: Bilateral;  . LUMBAR LAMINECTOMY/DECOMPRESSION MICRODISCECTOMY Left 06/24/2018   Procedure: LUMBAR HEMILAMINECTOMY, FORAMINOTOMY, CYST REMOVAL - 1 LEVEL LEFT L4-5;  Surgeon: [name], MD;  Location: [facility] ORS;  Service: Neurosurgery;  Laterality: Left;    Current Medications: Current Meds  Medication Sig  . cholecalciferol (VITAMIN D3) 25 MCG (1000 UNIT) tablet Take 1,000 Units by mouth daily.  . cyanocobalamin (,VITAMIN B-12,) 1000 MCG/ML injection 1000 mcg (1 mg) injection once per week for four weeks, followed by 1000 mcg injection once per month. (Patient taking differently: Inject 1,000 mcg into the muscle every 30 (thirty) days. )  . docusate sodium (COLACE) 100 MG capsule Take 100 mg by mouth 2 (two) times daily  as needed for mild constipation.  . hydrocortisone 2.5 % cream Apply twice daily to affected area with rash, for up to 1 to 2 weeks only  . ibuprofen (ADVIL) 800 MG tablet Take 1 tablet (800 mg total) by mouth every 8 (eight) hours as needed.  Marland Kitchen ketoconazole (NIZORAL) 2 % cream Apply twice daily to affected area with rash, for up to 1 to 2 weeks only  . lisinopril (ZESTRIL) 10 MG tablet Take 1 tablet (10 mg total) by mouth every morning.  . metFORMIN  (GLUCOPHAGE) 500 MG tablet Take 2 tablets (1,000 mg total) by mouth 2 (two) times daily with a meal.  . methocarbamol (ROBAXIN) 750 MG tablet Take 1 tablet (750 mg total) by mouth every 8 (eight) hours as needed for muscle spasms.  . pantoprazole (PROTONIX) 20 MG tablet Take 1 tablet (20 mg total) by mouth daily.  . pregabalin (LYRICA) 50 MG capsule Take 50 mg by mouth 3 (three) times daily.  Marland Kitchen triamcinolone cream (KENALOG) 0.1 % Apply to affected areas where itchy rash twice daily until rash is gone,  stop when gone. Avoid Face, groin, and underarms     Allergies:   Codeine   Social History   Socioeconomic History  . Marital status: Married    Spouse name: Gwyndolyn Saxon  . Number of children: Not on file  . Years of education: Not on file  . Highest education level: Not on file  Occupational History  . Occupation: Solicitor  Tobacco Use  . Smoking status: Never Smoker  . Smokeless tobacco: Never Used  Vaping Use  . Vaping Use: Never used  Substance and Sexual Activity  . Alcohol use: Yes    Comment: occassional  . Drug use: No  . Sexual activity: Not Currently    Birth control/protection: None  Other Topics Concern  . Not on file  Social History Narrative  . Not on file   Social Determinants of Health   Financial Resource Strain:   . Difficulty of Paying Living Expenses:   Food Insecurity:   . Worried About Charity fundraiser in the Last Year:   . Arboriculturist in the Last Year:   Transportation Needs:   . Film/video editor (Medical):   Marland Kitchen Lack of Transportation (Non-Medical):   Physical Activity:   . Days of Exercise per Week:   . Minutes of Exercise per Session:   Stress:   . Feeling of Stress :   Social Connections:   . Frequency of Communication with Friends and Family:   . Frequency of Social Gatherings with Friends and Family:   . Attends Religious Services:   . Active Member of Clubs or Organizations:   . Attends Archivist  Meetings:   Marland Kitchen Marital Status:      Family History: The patient's family history includes Bladder Cancer in her maternal grandfather; Brain cancer in her maternal uncle; Breast cancer in her mother; CAD in her brother and father; COPD in her mother; Cancer in her sister; Celiac disease in her brother; Diabetes in her brother, brother, maternal grandmother, mother, and sister; Emphysema in her mother; Heart disease in her brother; Lung cancer (age of onset: 66) in her mother; Nevi in her sister; Osteoporosis in her mother; Renal cancer in her maternal uncle; Stroke in her maternal grandmother.  ROS:   Please see the history of present illness.     All other systems reviewed and are negative.  EKGs/Labs/Other Studies Reviewed:  The following studies were reviewed today:   EKG:  EKG is  ordered today.  The ekg ordered today demonstrates normal sinus rhythm, normal ECG.  Recent Labs: 08/07/2019: TSH 2.48 11/21/2019: ALT 38; BUN 14; Creatinine, Ser 1.04; Hemoglobin 12.3; Platelets 220; Potassium 4.3; Sodium 136  Recent Lipid Panel    Component Value Date/Time   CHOL 161 11/13/2019 0905   TRIG 279.0 (H) 11/13/2019 0905   HDL 31.30 (L) 11/13/2019 0905   CHOLHDL 5 11/13/2019 0905   VLDL 55.8 (H) 11/13/2019 0905   LDLDIRECT 85.0 11/13/2019 0905    Physical Exam:    VS:  BP 122/74 (BP Location: Right Arm, Patient Position: Sitting, Cuff Size: Normal)   Pulse 80   Ht _0  (1.575 m)   Wt 192 lb (87.1 kg)   LMP 11/26/2017   SpO2 98%   BMI 35.12 kg/m     Wt Readings from Last 3 Encounters:  12/08/19 192 lb (87.1 kg)  11/21/19 195 lb (88.5 kg)  11/13/19 193 lb (87.5 kg)     GEN:  Well nourished, well developed in no acute distress HEENT: Normal NECK: No JVD; No carotid bruits LYMPHATICS: No lymphadenopathy CARDIAC: RRR, no murmurs, rubs, gallops RESPIRATORY:  Clear to auscultation without rales, wheezing or rhonchi  ABDOMEN: Soft, non-tender, non-distended MUSCULOSKELETAL:  No  edema; No deformity  SKIN: Warm and dry NEUROLOGIC:  Alert and oriented x 3 PSYCHIATRIC:  Normal affect   ASSESSMENT:    1. Dyspnea on exertion   2. Chest pain of uncertain etiology   3. Essential hypertension   4. Precordial pain    PLAN:    In order of problems listed above:  1. Patient with dyspnea on exertion over the past 5 months.  Will get echocardiogram to evaluate any cardiac dysfunction. 2. Patient has chest tightness associated with exertion.  Symptoms consistent with angina.  Has risk factors for CAD including hypertension, diabetes, strong family history of early CAD.  Will evaluate patient with a coronary CTA for presence of CAD. 3. Patient with history of hypertension, blood pressure well controlled.  Continue lisinopril as prescribed.  Follow-up after echocardiogram and coronary CTA.   Medication Adjustments/Labs and Tests Ordered: Current medicines are reviewed at length with the patient today.  Concerns regarding medicines are outlined above.  Orders Placed This Encounter  Procedures  . CT CORONARY MORPH W/CTA COR W/SCORE W/CA W/CM &/OR WO/CM  . CT CORONARY FRACTIONAL FLOW RESERVE DATA PREP  . CT CORONARY FRACTIONAL FLOW RESERVE FLUID ANALYSIS  . Basic metabolic panel  . EKG 12-Lead  . ECHOCARDIOGRAM COMPLETE   Meds ordered this encounter  Medications  . metoprolol tartrate (LOPRESSOR) 100 MG tablet    Sig: Take 1 tablet (100 mg) by mouth 2 hours prior to Cardiac CT    Dispense:  1 tablet    Refill:  0    Patient Instructions  Medication Instructions:  Your physician recommends that you continue on your current medications as directed. Please refer to the Current Medication list given to you today.  A one time dose of Metoprolol 100 mg has been sent to your pharmacy to be taken 2 hours prior to the Cardiac CT  *If you need a refill on your cardiac medications before your next appointment, please call your pharmacy*   Lab Work: Your physician  recommends that you return for lab work a few days prior to the CT.  Please have your lab (Bmet) drawn at the [facility] medical mall lab.  You do not need an appointment. Lab hours are Mon-Fri 7am-6pm.   If you have labs (blood work) drawn today and your tests are completely normal, you will receive your results only by: Marland Kitchen MyChart Message (if you have MyChart) OR . A paper copy in the mail If you have any lab test that is abnormal or we need to change your treatment, we will call you to review the results.   Testing/Procedures: Your physician has requested that you have an echocardiogram. Echocardiography is a painless test that uses sound waves to create images of your heart. It provides your doctor with information about the size and shape of your heart and how well your heart's chambers and valves are working. This procedure takes approximately one hour. There are no restrictions for this procedure.  Your physician has requested that you have cardiac CT. Cardiac computed tomography (CT) is a painless test that uses an x-ray machine to take clear, detailed pictures of your heart. For further information please visit [email or web address].[email or web address]. Please follow instruction sheet as given.      Follow-Up: At [facility], you and your health needs are our priority.  As part of our continuing mission to provide you with exceptional heart care, we have created designated Provider Care Teams.  These Care Teams include your primary Cardiologist (physician) and Advanced Practice Providers (APPs -  Physician Assistants and Nurse Practitioners) who all work together to provide you with the care you need, when you need it.  We recommend signing up for the patient portal called "MyChart".  Sign up information is provided on this After Visit Summary.  MyChart is used to connect with patients for Virtual Visits (Telemedicine).  Patients are able to view lab/test results, encounter notes, upcoming appointments, etc.   Non-urgent messages can be sent to your provider as well.   To learn more about what you can do with MyChart, go to [email or web address].    Your next appointment:   5 week(s)  The format for your next appointment:   In Person  Provider:    You may see  Dr. Garen Lah or one of the following Advanced Practice Providers on your designated Care Team:    Murray Hodgkins, NP  Christell Faith, PA-C  Marrianne Mood, PA-C    Other Instructions Your cardiac CT will be scheduled at one of the below locations:   [facility] [address] [place], [place] [address] [phone]  [place] [address] [place], [place] [address] [phone]  If scheduled at [facility], please arrive at the [facility] main entrance of [facility] 30 minutes prior to test start time. Proceed to the [facility] Radiology Department (first floor) to check-in and test prep.  If scheduled at [facility], please arrive 15 mins early for check-in and test prep.  Please follow these instructions carefully (unless otherwise directed):    On the Night Before the Test: . Be sure to Drink plenty of water. . Do not consume any caffeinated/decaffeinated beverages or chocolate 12 hours prior to your test. . Do not take any antihistamines 12 hours prior to your test. I On the Day of the Test: . Drink plenty of water. Do not drink any water within one hour of the test. . Do not eat any food 4 hours prior to the test. . You may take your regular medications prior to the test.  . Take metoprolol (Lopressor) two  hours prior to test. . FEMALES- please wear underwire-free bra if available         After the Test: . Drink plenty of water. . After receiving IV contrast, you may experience a mild flushed feeling. This is normal. . On occasion, you may experience a mild rash up to 24 hours after  the test. This is not dangerous. If this occurs, you can take Benadryl 25 mg and increase your fluid intake. . If you experience trouble breathing, this can be serious. If it is severe call 911 IMMEDIATELY. If it is mild, please call our office. . If you take any of these medications: Glipizide/Metformin, Avandament, Glucavance, please do not take 48 hours after completing test unless otherwise instructed.   Once we have confirmed authorization from your insurance company, we will call you to set up a date and time for your test. Based on how quickly your insurance processes prior authorizations requests, please allow up to 4 weeks to be contacted for scheduling your Cardiac CT appointment. Be advised that routine Cardiac CT appointments could be scheduled as many as 8 weeks after your provider has ordered it.  For non-scheduling related questions, please contact the cardiac imaging nurse navigator should you have any questions/concerns: Marchia Bond, Cardiac Imaging Nurse Navigator Burley Saver, Interim Cardiac Imaging Nurse [place] [facility] Vascular Services Direct Office Dial: [phone]   For scheduling needs, including cancellations and rescheduling, please call Vivien Rota at [phone], option 3.        Signed, Kate Sable, MD  12/08/2019 12:49 PM    [place]

## 2019-12-18 ENCOUNTER — Ambulatory Visit
Payer: Managed Care, Other (non HMO) | Attending: Student in an Organized Health Care Education/Training Program | Admitting: Student in an Organized Health Care Education/Training Program

## 2019-12-18 ENCOUNTER — Encounter: Payer: Self-pay | Admitting: Student in an Organized Health Care Education/Training Program

## 2019-12-18 ENCOUNTER — Other Ambulatory Visit: Payer: Self-pay

## 2019-12-18 VITALS — BP 148/78 | HR 81 | Temp 97.2°F | Resp 16 | Ht 62.0 in | Wt 190.0 lb

## 2019-12-18 DIAGNOSIS — Z9889 Other specified postprocedural states: Secondary | ICD-10-CM | POA: Diagnosis present

## 2019-12-18 DIAGNOSIS — G894 Chronic pain syndrome: Secondary | ICD-10-CM | POA: Insufficient documentation

## 2019-12-18 DIAGNOSIS — G8929 Other chronic pain: Secondary | ICD-10-CM | POA: Diagnosis present

## 2019-12-18 DIAGNOSIS — M5416 Radiculopathy, lumbar region: Secondary | ICD-10-CM

## 2019-12-18 NOTE — Assessment & Plan Note (Signed)
-  Repeat L5/S1 (LEFT) ESI  -Consider SCS trial

## 2019-12-18 NOTE — Patient Instructions (Signed)
GENERAL RISKS AND COMPLICATIONS  What are the risk, side effects and possible complications? Generally speaking, most procedures are safe.  However, with any procedure there are risks, side effects, and the possibility of complications.  The risks and complications are dependent upon the sites that are lesioned, or the type of nerve block to be performed.  The closer the procedure is to the spine, the more serious the risks are.  Great care is taken when placing the radio frequency needles, block needles or lesioning probes, but sometimes complications can occur. 1. Infection: Any time there is an injection through the skin, there is a risk of infection.  This is why sterile conditions are used for these blocks.  There are four possible types of infection. 1. Localized skin infection. 2. Central Nervous System Infection-This can be in the form of Meningitis, which can be deadly. 3. Epidural Infections-This can be in the form of an epidural abscess, which can cause pressure inside of the spine, causing compression of the spinal cord with subsequent paralysis. This would require an emergency surgery to decompress, and there are no guarantees that the patient would recover from the paralysis. 4. Discitis-This is an infection of the intervertebral discs.  It occurs in about 1% of discography procedures.  It is difficult to treat and it may lead to surgery.        2. Pain: the needles have to go through skin and soft tissues, will cause soreness.       3. Damage to internal structures:  The nerves to be lesioned may be near blood vessels or    other nerves which can be potentially damaged.       4. Bleeding: Bleeding is more common if the patient is taking blood thinners such as  aspirin, Coumadin, Ticiid, Plavix, etc., or if he/she have some genetic predisposition  such as hemophilia. Bleeding into the spinal canal can cause compression of the spinal  cord with subsequent paralysis.  This would require an  emergency surgery to  decompress and there are no guarantees that the patient would recover from the  paralysis.       5. Pneumothorax:  Puncturing of a lung is a possibility, every time a needle is introduced in  the area of the chest or upper back.  Pneumothorax refers to free air around the  collapsed lung(s), inside of the thoracic cavity (chest cavity).  Another two possible  complications related to a similar event would include: Hemothorax and Chylothorax.   These are variations of the Pneumothorax, where instead of air around the collapsed  lung(s), you may have blood or chyle, respectively.       6. Spinal headaches: They may occur with any procedures in the area of the spine.       7. Persistent CSF (Cerebro-Spinal Fluid) leakage: This is a rare problem, but may occur  with prolonged intrathecal or epidural catheters either due to the formation of a fistulous  track or a dural tear.       8. Nerve damage: By working so close to the spinal cord, there is always a possibility of  nerve damage, which could be as serious as a permanent spinal cord injury with  paralysis.       9. Death:  Although rare, severe deadly allergic reactions known as "Anaphylactic  reaction" can occur to any of the medications used.      10. Worsening of the symptoms:  We can always make thing worse.    What are the chances of something like this happening? Chances of any of this occuring are extremely low.  By statistics, you have more of a chance of getting killed in a motor vehicle accident: while driving to the hospital than any of the above occurring .  Nevertheless, you should be aware that they are possibilities.  In general, it is similar to taking a shower.  Everybody knows that you can slip, hit your head and get killed.  Does that mean that you should not shower again?  Nevertheless always keep in mind that statistics do not mean anything if you happen to be on the wrong side of them.  Even if a procedure has a 1  (one) in a 1,000,000 (million) chance of going wrong, it you happen to be that one..Also, keep in mind that by statistics, you have more of a chance of having something go wrong when taking medications.  Who should not have this procedure? If you are on a blood thinning medication (e.g. Coumadin, Plavix, see list of "Blood Thinners"), or if you have an active infection going on, you should not have the procedure.  If you are taking any blood thinners, please inform your physician.  How should I prepare for this procedure?  Do not eat or drink anything at least six hours prior to the procedure.  Bring a driver with you .  It cannot be a taxi.  Come accompanied by an adult that can drive you back, and that is strong enough to help you if your legs get weak or numb from the local anesthetic.  Take all of your medicines the morning of the procedure with just enough water to swallow them.  If you have diabetes, make sure that you are scheduled to have your procedure done first thing in the morning, whenever possible.  If you have diabetes, take only half of your insulin dose and notify our nurse that you have done so as soon as you arrive at the clinic.  If you are diabetic, but only take blood sugar pills (oral hypoglycemic), then do not take them on the morning of your procedure.  You may take them after you have had the procedure.  Do not take aspirin or any aspirin-containing medications, at least eleven (11) days prior to the procedure.  They may prolong bleeding.  Wear loose fitting clothing that may be easy to take off and that you would not mind if it got stained with Betadine or blood.  Do not wear any jewelry or perfume  Remove any nail coloring.  It will interfere with some of our monitoring equipment.  NOTE: Remember that this is not meant to be interpreted as a complete list of all possible complications.  Unforeseen problems may occur.  BLOOD THINNERS The following drugs  contain aspirin or other products, which can cause increased bleeding during surgery and should not be taken for 2 weeks prior to and 1 week after surgery.  If you should need take something for relief of minor pain, you may take acetaminophen which is found in Tylenol,m Datril, Anacin-3 and Panadol. It is not blood thinner. The products listed below are.  Do not take any of the products listed below in addition to any listed on your instruction sheet.  A.P.C or A.P.C with Codeine Codeine Phosphate Capsules #3 Ibuprofen Ridaura  ABC compound Congesprin Imuran rimadil  Advil Cope Indocin Robaxisal  Alka-Seltzer Effervescent Pain Reliever and Antacid Coricidin or Coricidin-D  Indomethacin Rufen    Alka-Seltzer plus Cold Medicine Cosprin Ketoprofen S-A-C Tablets  Anacin Analgesic Tablets or Capsules Coumadin Korlgesic Salflex  Anacin Extra Strength Analgesic tablets or capsules CP-2 Tablets Lanoril Salicylate  Anaprox Cuprimine Capsules Levenox Salocol  Anexsia-D Dalteparin Magan Salsalate  Anodynos Darvon compound Magnesium Salicylate Sine-off  Ansaid Dasin Capsules Magsal Sodium Salicylate  Anturane Depen Capsules Marnal Soma  APF Arthritis pain formula Dewitt's Pills Measurin Stanback  Argesic Dia-Gesic Meclofenamic Sulfinpyrazone  Arthritis Bayer Timed Release Aspirin Diclofenac Meclomen Sulindac  Arthritis pain formula Anacin Dicumarol Medipren Supac  Analgesic (Safety coated) Arthralgen Diffunasal Mefanamic Suprofen  Arthritis Strength Bufferin Dihydrocodeine Mepro Compound Suprol  Arthropan liquid Dopirydamole Methcarbomol with Aspirin Synalgos  ASA tablets/Enseals Disalcid Micrainin Tagament  Ascriptin Doan's Midol Talwin  Ascriptin A/D Dolene Mobidin Tanderil  Ascriptin Extra Strength Dolobid Moblgesic Ticlid  Ascriptin with Codeine Doloprin or Doloprin with Codeine Momentum Tolectin  Asperbuf Duoprin Mono-gesic Trendar  Aspergum Duradyne Motrin or Motrin IB Triminicin  Aspirin  plain, buffered or enteric coated Durasal Myochrisine Trigesic  Aspirin Suppositories Easprin Nalfon Trillsate  Aspirin with Codeine Ecotrin Regular or Extra Strength Naprosyn Uracel  Atromid-S Efficin Naproxen Ursinus  Auranofin Capsules Elmiron Neocylate Vanquish  Axotal Emagrin Norgesic Verin  Azathioprine Empirin or Empirin with Codeine Normiflo Vitamin E  Azolid Emprazil Nuprin Voltaren  Bayer Aspirin plain, buffered or children's or timed BC Tablets or powders Encaprin Orgaran Warfarin Sodium  Buff-a-Comp Enoxaparin Orudis Zorpin  Buff-a-Comp with Codeine Equegesic Os-Cal-Gesic   Buffaprin Excedrin plain, buffered or Extra Strength Oxalid   Bufferin Arthritis Strength Feldene Oxphenbutazone   Bufferin plain or Extra Strength Feldene Capsules Oxycodone with Aspirin   Bufferin with Codeine Fenoprofen Fenoprofen Pabalate or Pabalate-SF   Buffets II Flogesic Panagesic   Buffinol plain or Extra Strength Florinal or Florinal with Codeine Panwarfarin   Buf-Tabs Flurbiprofen Penicillamine   Butalbital Compound Four-way cold tablets Penicillin   Butazolidin Fragmin Pepto-Bismol   Carbenicillin Geminisyn Percodan   Carna Arthritis Reliever Geopen Persantine   Carprofen Gold's salt Persistin   Chloramphenicol Goody's Phenylbutazone   Chloromycetin Haltrain Piroxlcam   Clmetidine heparin Plaquenil   Cllnoril Hyco-pap Ponstel   Clofibrate Hydroxy chloroquine Propoxyphen         Before stopping any of these medications, be sure to consult the physician who ordered them.  Some, such as Coumadin (Warfarin) are ordered to prevent or treat serious conditions such as "deep thrombosis", "pumonary embolisms", and other heart problems.  The amount of time that you may need off of the medication may also vary with the medication and the reason for which you were taking it.  If you are taking any of these medications, please make sure you notify your pain physician before you undergo any  procedures.         Epidural Steroid Injection Patient Information  Description: The epidural space surrounds the nerves as they exit the spinal cord.  In some patients, the nerves can be compressed and inflamed by a bulging disc or a tight spinal canal (spinal stenosis).  By injecting steroids into the epidural space, we can bring irritated nerves into direct contact with a potentially helpful medication.  These steroids act directly on the irritated nerves and can reduce swelling and inflammation which often leads to decreased pain.  Epidural steroids may be injected anywhere along the spine and from the neck to the low back depending upon the location of your pain.   After numbing the skin with local anesthetic (like Novocaine), a small needle is passed   into the epidural space slowly.  You may experience a sensation of pressure while this is being done.  The entire block usually last less than 10 minutes.  Conditions which may be treated by epidural steroids:   Low back and leg pain  Neck and arm pain  Spinal stenosis  Post-laminectomy syndrome  Herpes zoster (shingles) pain  Pain from compression fractures  Preparation for the injection:  1. Do not eat any solid food or dairy products within 8 hours of your appointment.  2. You may drink clear liquids up to 3 hours before appointment.  Clear liquids include water, black coffee, juice or soda.  No milk or cream please. 3. You may take your regular medication, including pain medications, with a sip of water before your appointment  Diabetics should hold regular insulin (if taken separately) and take 1/2 normal NPH dos the morning of the procedure.  Carry some sugar containing items with you to your appointment. 4. A driver must accompany you and be prepared to drive you home after your procedure.  5. Bring all your current medications with your. 6. An IV may be inserted and sedation may be given at the discretion of the  physician.   7. A blood pressure cuff, EKG and other monitors will often be applied during the procedure.  Some patients may need to have extra oxygen administered for a short period. 8. You will be asked to provide medical information, including your allergies, prior to the procedure.  We must know immediately if you are taking blood thinners (like Coumadin/Warfarin)  Or if you are allergic to IV iodine contrast (dye). We must know if you could possible be pregnant.  Possible side-effects:  Bleeding from needle site  Infection (rare, may require surgery)  Nerve injury (rare)  Numbness & tingling (temporary)  Difficulty urinating (rare, temporary)  Spinal headache ( a headache worse with upright posture)  Light -headedness (temporary)  Pain at injection site (several days)  Decreased blood pressure (temporary)  Weakness in arm/leg (temporary)  Pressure sensation in back/neck (temporary)  Call if you experience:  Fever/chills associated with headache or increased back/neck pain.  Headache worsened by an upright position.  New onset weakness or numbness of an extremity below the injection site  Hives or difficulty breathing (go to the emergency room)  Inflammation or drainage at the infection site  Severe back/neck pain  Any new symptoms which are concerning to you  Please note:  Although the local anesthetic injected can often make your back or neck feel good for several hours after the injection, the pain will likely return.  It takes 3-7 days for steroids to work in the epidural space.  You may not notice any pain relief for at least that one week.  If effective, we will often do a series of three injections spaced 3-6 weeks apart to maximally decrease your pain.  After the initial series, we generally will wait several months before considering a repeat injection of the same type.  If you have any questions, please call [phone] [facility] 

## 2019-12-18 NOTE — Progress Notes (Signed)
Safety precautions to be maintained throughout the outpatient stay will include: orient to surroundings, keep bed in low position, maintain call bell within reach at all times, provide assistance with transfer out of bed and ambulation.  

## 2019-12-18 NOTE — Progress Notes (Signed)
PROVIDER NOTE: Information contained herein reflects review and annotations entered in association with encounter. Interpretation of such information and data should be left to medically-trained personnel. Information provided to patient can be located elsewhere in the medical record under "Patient Instructions". Document created using STT-dictation technology, any transcriptional errors that may result from process are unintentional.    Patient: Sherry Hobbs  Service Category: E/M  Provider: Gillis Santa, MD  DOB: [birth year]  DOS: 12/18/2019  Specialty: Interventional Pain Management  MRN: [id number]  Setting: Ambulatory outpatient  PCP: Sherry Regal, NP  Type: Established Patient    Referring Provider: Marval Regal, NP  Location: Office  Delivery: Face-to-face     HPI  Reason for encounter: [name], a 49 y.o. year old female, is here today for evaluation and management of her Chronic radicular lumbar pain [M54.16, G89.29]. Sherry Hobbs primary complain today is Hip Pain (left) Last encounter: Practice (09/18/2019). My last encounter with her was on 09/18/2019. Pertinent problems: Sherry Hobbs has History of lumbar surgery (L4-L5 laminectomy for cyst removal Dr Sherry Hobbs 06/2018); Chronic radicular lumbar pain (left); Lumbar radiculopathy (left); Chronic pain syndrome; Left hip pain; and Low back pain on their pertinent problem list. Pain Assessment: Severity of Chronic pain is reported as a 1 /10. Location: Hip Left/denies. Onset: More than a month ago. Quality: Cramping. Timing: Intermittent. Modifying factor(s): procedures. Vitals:  height is '5\' 2"'  (1.575 m) and weight is 190 lb (86.2 kg). Her temporal temperature is 97.2 F (36.2 C) (abnormal). Her blood pressure is 148/78 (abnormal) and her pulse is 81. Her respiration is 16 and oxygen saturation is 99%.   Patient presents today with left low back, left hip, radiating left leg pain related to left lumbar radiculopathy.  She has a  history of L4-L5 laminectomy for synovial cyst and now has associated left L4-L5 radiculopathy.  She is status post L5-S1 lumbar epidural steroid injection on 03/05/2019 which was effective for her symptoms.  Given flareup of her lumbar radicular pain, patient is interested in repeating lumbar epidural steroid injection.  We also discussed possible spinal cord stimulation for her symptoms.  We will start with epidural injection and see how she does.  Risk and benefits reviewed and patient would like to proceed.    ROS  Constitutional: Denies any fever or chills Gastrointestinal: No reported hemesis, hematochezia, vomiting, or acute GI distress Musculoskeletal: Positive for low back, left leg pain Neurological: Paresthesias left leg  Medication Review  cholecalciferol, cyanocobalamin, docusate sodium, hydrocortisone, ibuprofen, ketoconazole, lisinopril, metFORMIN, methocarbamol, metoprolol tartrate, pantoprazole, pregabalin, sucralfate, and triamcinolone cream  History Review  Allergy: Sherry Hobbs is allergic to codeine. Drug: Sherry Hobbs  reports no history of drug use. Alcohol:  reports current alcohol use. Tobacco:  reports that she has never smoked. She has never used smokeless tobacco. Social: Sherry Hobbs  reports that she has never smoked. She has never used smokeless tobacco. She reports current alcohol use. She reports that she does not use drugs. Medical:  has a past medical history of Anemia, Arthritis, Depression, Diabetes mellitus without complication ([place]), DM (diabetes mellitus) ([place]) (08/07/2019), Elevated serum creatinine, GERD (gastroesophageal reflux disease), Headache, History of blood transfusion (05/02/2016), HTN (hypertension) (08/07/2019), Hypertension, and Neuropathy. Surgical: Sherry Hobbs  has a past surgical history that includes Dilation and curettage of uterus (05/03/2016); Dilatation & currettage/hysteroscopy with novasure ablation (N/A, 12/03/2017); Lumbar  laminectomy/decompression microdiscectomy (Left, 06/24/2018); Back surgery; Laparoscopic assisted vaginal hysterectomy (N/A, 07/07/2019); Laparoscopic bilateral salpingectomy (Bilateral, 07/07/2019); and  Abdominal hysterectomy. Family: family history includes Bladder Cancer in her maternal grandfather; Brain cancer in her maternal uncle; Breast cancer in her mother; CAD in her brother and father; COPD in her mother; Cancer in her sister; Celiac disease in her brother; Diabetes in her brother, brother, maternal grandmother, mother, and sister; Emphysema in her mother; Heart disease in her brother; Lung cancer (age of onset: 63) in her mother; Nevi in her sister; Osteoporosis in her mother; Renal cancer in her maternal uncle; Stroke in her maternal grandmother.  Laboratory Chemistry Profile   Renal Lab Results  Component Value Date   BUN 14 11/21/2019   CREATININE 1.04 (H) 11/21/2019   GFR 55.67 (L) 11/13/2019   GFRAA >60 11/21/2019   GFRNONAA >60 11/21/2019     Hepatic Lab Results  Component Value Date   AST 30 11/21/2019   ALT 38 11/21/2019   ALBUMIN 3.5 11/21/2019   ALKPHOS 90 11/21/2019   HCVAB <0.1 03/30/2016   LIPASE 34 11/21/2019     Electrolytes Lab Results  Component Value Date   NA 136 11/21/2019   K 4.3 11/21/2019   CL 101 11/21/2019   CALCIUM 9.0 11/21/2019     Bone No results found for: VD25OH, VD125OH2TOT, WL8937DS2, AJ6811XB2, 25OHVITD1, 25OHVITD2, 25OHVITD3, TESTOFREE, TESTOSTERONE   Inflammation (CRP: Acute Phase) (ESR: Chronic Phase) No results found for: CRP, ESRSEDRATE, LATICACIDVEN     Note: Above Lab results reviewed.  Recent Imaging Review  US PELVIC COMPLETE W TRANSVAGINAL AND TORSION R/O CLINICAL DATA:  Abnormal CT scan, bilateral adnexal cysts  EXAM: TRANSABDOMINAL AND TRANSVAGINAL ULTRASOUND OF PELVIS  DOPPLER ULTRASOUND OF OVARIES  TECHNIQUE: Both transabdominal and transvaginal ultrasound examinations of the pelvis were performed.  Transabdominal technique was performed for global imaging of the pelvis including uterus, ovaries, adnexal regions, and pelvic cul-de-sac.  It was necessary to proceed with endovaginal exam following the transabdominal exam to visualize the ovaries bilaterally. Color and duplex Doppler ultrasound was utilized to evaluate blood flow to the ovaries.  COMPARISON:  05/22/2019  FINDINGS: Uterus  The uterus is absent.  Endometrium  Not applicable.  Right ovary  Measurements: 6.1 x 3.7 x 4.6 cm = volume: 55 mL. The cystic structures identified on prior CT examination represent 2 simple unilocular cysts within the right ovary measuring 3.6 x 2.8 x 3.4 cm and 2.2 x 2.2 x 2.1 cm. There is no associated septation, debris, mural nodule, or papillary process identified. These were previously seen, but demonstrate interval increase in size since prior examination.  Left ovary  Measurements: 5.2 x 2.9 x 3.7 cm = volume: 29 mL. The cystic structure noted on prior CT examinations represents a simple unilocular cyst measuring 4.8 x 2.6 x 3.4 cm. There is no associated septation, debris, mural nodule, or papillary process. This appears enlarged since prior examination.  Pulsed Doppler evaluation of both ovaries demonstrates normal low-resistance arterial and venous waveforms.  Other findings  No abnormal free fluid.  IMPRESSION: Cystic structures noted within the adnexa on prior examination represent simple unilocular cysts, possibly representing follicular cysts. Given their size, further follow-up is not required in the premenopausal patient.  Electronically Signed   By: Fidela Salisbury MD   On: 11/21/2019 12:26 CT ABDOMEN PELVIS W CONTRAST CLINICAL DATA:  Right lower quadrant abdominal pain, nausea, vomiting  EXAM: CT ABDOMEN AND PELVIS WITH CONTRAST  TECHNIQUE: Multidetector CT imaging of the abdomen and pelvis was performed using the standard protocol following bolus  administration of intravenous contrast.  CONTRAST:  180m OMNIPAQUE IOHEXOL 300 MG/ML  SOLN  COMPARISON:  None.  FINDINGS: Lower chest: The visualized lung bases are clear bilaterally. The visualized heart and pericardium are unremarkable.  Hepatobiliary: Severe hepatic steatosis. Mild fatty sparing within the gallbladder fossa. Gallbladder is unremarkable.  Pancreas: Unremarkable  Spleen: Borderline splenomegaly. No intra splenic lesions are identified. The splenic vein is patent.  Adrenals/Urinary Tract: Unremarkable  Stomach/Bowel: Unremarkable. Specifically, the appendix is normal. No free intraperitoneal fluid.  Vascular/Lymphatic: No pathologic adenopathy within the abdomen and pelvis.  Abdominal vasculature is unremarkable save for a a circumaortic left renal vein.  Reproductive: Bilateral adnexal cystic lesions are identified, not optimally characterized on this examination. On the right, this lesion measures at least 5.1 x 5.5 by 3.7 cm and there is suggestion of internal septation. On the left, this measures 3.2 x 3.7 by 5.0 cm. The uterus is absent.  Other: None significant  Musculoskeletal: Mild degenerative changes are seen within the lumbar spine. No lytic or blastic bone lesions are seen.  IMPRESSION: Bilateral adnexal cystic lesions, not well characterized on this examination, possibly complex on the right. Dedicated transvaginal sonography is recommended for more definitive characterization.  These results will be called to the ordering clinician or representative by the Radiologist Assistant, and communication documented in the PACS or [employer]  Electronically Signed   By: AFidela SalisburyMD   On: 11/21/2019 09:25 UKoreaAbdomen Limited RUQ CLINICAL DATA:  Upper abdominal pain  EXAM: ULTRASOUND ABDOMEN LIMITED RIGHT UPPER QUADRANT  COMPARISON:  03/14/2008  FINDINGS: Gallbladder:  No gallstones or wall thickening visualized. No  reported tenderness over the gallbladder.  Common bile duct:  Diameter: 3.4 mm  Liver:  Markedly echogenic liver with heterogeneous echotexture limits assessment. Within the superficial portions of the liver on submitted images no visible lesion. Portal vein at the porta hepatis not well assessed. Peripheral branches appear grossly patent.  Other: No visible ascites  IMPRESSION: 1. Marked hepatic steatosis and heterogeneous hepatic echotexture limits the assessment. Findings are concerning for liver disease in the setting of marked steatosis. Laboratory and clinical correlation may be helpful. 2. No ultrasound signs of acute cholecystitis or main duct dilation within limitations of the exam.  Electronically Signed   By: GZetta BillsM.D.   On: 11/21/2019 08:43 Note: Reviewed        Physical Exam  General appearance: Well nourished, well developed, and well hydrated. In no apparent acute distress Mental status: Alert, oriented x 3 (person, place, & time)       Respiratory: No evidence of acute respiratory distress Eyes: PERLA Vitals: BP (!) 148/78   Pulse 81   Temp (!) 97.2 F (36.2 C) (Temporal)   Resp 16   Ht '5\' 2"'  (1.575 m)   Wt 190 lb (86.2 kg)   LMP 11/26/2017   SpO2 99%   BMI 34.75 kg/m  BMI: Estimated body mass index is 34.75 kg/m as calculated from the following:   Height as of this encounter: '5\' 2"'  (1.575 m).   Weight as of this encounter: 190 lb (86.2 kg). Ideal: Ideal body weight: 50.1 kg (110 lb 7.2 oz) Adjusted ideal body weight: 64.5 kg (142 lb 4.3 oz)  Lumbar Spine Area Exam  Skin & Axial Inspection: Well healed scar from previous spine surgery detected Alignment: Symmetrical Functional ROM: Pain restricted ROM       Stability: No instability detected Muscle Tone/Strength: Functionally intact. No obvious neuro-muscular anomalies detected. Sensory (Neurological): Dermatomal pain pattern left Palpation: No  palpable anomalies       Provocative  Tests: Hyperextension/rotation test: deferred today       Lumbar quadrant test (Kemp's test): deferred today       Lateral bending test: (+) ipsilateral radicular pain, on the left. Positive for left-sided foraminal stenosis. Patrick's Maneuver: deferred today                   FABER* test: deferred today                   S-I anterior distraction/compression test: deferred today         S-I lateral compression test: deferred today         S-I Thigh-thrust test: deferred today         S-I Gaenslen's test: deferred today         *(Flexion, ABduction and External Rotation)  Gait & Posture Assessment  Ambulation: Unassisted Gait: Relatively normal for age and body habitus Posture: WNL   Lower Extremity Exam    Side: Right lower extremity  Side: Left lower extremity  Stability: No instability observed          Stability: No instability observed          Skin & Extremity Inspection: Skin color, temperature, and hair growth are WNL. No peripheral edema or cyanosis. No masses, redness, swelling, asymmetry, or associated skin lesions. No contractures.  Skin & Extremity Inspection: Skin color, temperature, and hair growth are WNL. No peripheral edema or cyanosis. No masses, redness, swelling, asymmetry, or associated skin lesions. No contractures.  Functional ROM: Unrestricted ROM                  Functional ROM: Pain restricted ROM for hip and knee joints          Muscle Tone/Strength: Functionally intact. No obvious neuro-muscular anomalies detected.  Muscle Tone/Strength: Functionally intact. No obvious neuro-muscular anomalies detected.  Sensory (Neurological): Unimpaired        Sensory (Neurological): Dermatomal pain pattern        DTR: Patellar: 1+: trace Achilles: 1+: trace Plantar: deferred today  DTR: Patellar: 1+: trace Achilles: 1+: trace Plantar: deferred today  Palpation: No palpable anomalies  Palpation: No palpable anomalies     Assessment   Status Diagnosis   Having a Flare-up Having a Flare-up Persistent 1. Chronic radicular lumbar pain   2. Lumbar radiculopathy   3. History of lumbar surgery   4. Chronic pain syndrome      Updated Problems: Problem  History of lumbar surgery (L4-L5 laminectomy for cyst removal Dr Sherry Hobbs 06/2018)  Chronic radicular lumbar pain (left)    history of L4-L5 laminectomy for synovial cyst and now has associated left L4-L5 radiculopathy.  She is status post L5-S1 lumbar epidural steroid injection on 03/05/2019 which was effective for her symptoms.    Lumbar radiculopathy (left)  Chronic Pain Syndrome  Left Hip Pain  Low Back Pain    Plan of Care  Problem-specific:  Chronic radicular lumbar pain (left) -Repeat L5/S1 (LEFT) ESI  -Consider SCS trial  Orders:  Orders Placed This Encounter  Procedures  . Lumbar Epidural Injection    Standing Status:   Future    Standing Expiration Date:   01/18/2020    Scheduling Instructions:     Procedure: Interlaminar Lumbar Epidural Steroid injection (LESI)            Laterality: Midline     Sedation: without     Timeframe: ASAA  Order Specific Question:   Where will this procedure be performed?    Answer:   [facility]   Follow-up plan:   Return in about 1 week (around 12/25/2019) for L5/S1 ESI #2 (L4/5  lami), without sedation.     History of L4-5 laminectomy with Dr. Lacinda Hobbs due to synovial cyst now having left L4-L5 radicular pain status post L5-S1 ESI on the left on 03/05/2019, helped significantly, repeat PRN      Recent Visits No visits were found meeting these conditions. Showing recent visits within past 90 days and meeting all other requirements Today's Visits Date Type Provider Dept  12/18/19 Office Visit Sherry Santa, MD [facility]  Showing today's visits and meeting all other requirements Future Appointments No visits were found meeting these conditions. Showing future appointments within next 90 days and meeting all other  requirements  I discussed the assessment and treatment plan with the patient. The patient was provided an opportunity to ask questions and all were answered. The patient agreed with the plan and demonstrated an understanding of the instructions.  Patient advised to call back or seek an in-person evaluation if the symptoms or condition worsens.  Duration of encounter: 30 minutes.  Note by: Sherry Santa, MD Date: 12/18/2019; Time: 8:23 AM

## 2019-12-23 ENCOUNTER — Telehealth (HOSPITAL_COMMUNITY): Payer: Self-pay | Admitting: *Deleted

## 2019-12-23 NOTE — Telephone Encounter (Signed)
Reaching out to patient to offer assistance regarding upcoming cardiac imaging study; pt verbalizes understanding of appt date/time, parking situation and where to check in, pre-test NPO status and medications ordered, and verified current allergies; name and call back number provided for further questions should they arise  Dior Dominik Tai RN Navigator Cardiac Imaging [facility] [phone] office [phone] cell 

## 2019-12-24 ENCOUNTER — Other Ambulatory Visit: Payer: Self-pay

## 2019-12-24 DIAGNOSIS — M79605 Pain in left leg: Secondary | ICD-10-CM

## 2019-12-25 ENCOUNTER — Other Ambulatory Visit: Payer: Self-pay

## 2019-12-25 ENCOUNTER — Ambulatory Visit
Admission: RE | Admit: 2019-12-25 | Discharge: 2019-12-25 | Disposition: A | Discharge status: Home / Self Care | Payer: Managed Care, Other (non HMO) | Source: Ambulatory Visit | Attending: Cardiology | Admitting: Cardiology

## 2019-12-25 DIAGNOSIS — R072 Precordial pain: Secondary | ICD-10-CM | POA: Diagnosis not present

## 2019-12-25 LAB — POCT I-STAT CREATININE: Creatinine, Ser: 1.2 mg/dL — ABNORMAL HIGH (ref 0.44–1.00)

## 2019-12-25 MED ORDER — METOPROLOL TARTRATE 5 MG/5ML IV SOLN
10.0000 mg | Freq: Once | INTRAVENOUS | Status: AC
  Administered 2019-12-25: 10 mg via INTRAVENOUS

## 2019-12-25 MED ORDER — METOPROLOL TARTRATE 5 MG/5ML IV SOLN
5.0000 mg | Freq: Once | INTRAVENOUS | Status: AC
  Administered 2019-12-25: 5 mg via INTRAVENOUS

## 2019-12-25 MED ORDER — NITROGLYCERIN 0.4 MG SL SUBL
0.8000 mg | SUBLINGUAL_TABLET | Freq: Once | SUBLINGUAL | Status: AC
  Administered 2019-12-25: 0.8 mg via SUBLINGUAL

## 2019-12-25 MED ORDER — IOHEXOL 350 MG/ML SOLN
90.0000 mL | Freq: Once | INTRAVENOUS | Status: AC | PRN
  Administered 2019-12-25: 90 mL via INTRAVENOUS

## 2019-12-25 NOTE — Progress Notes (Signed)
Patient tolerated procedure well. Ambulate w/o difficulty. Sitting in chair drinking. No needs. All questions answered. ABC intact. Discharge from procedure area w/o issues.

## 2019-12-28 ENCOUNTER — Other Ambulatory Visit: Payer: Self-pay | Admitting: Obstetrics and Gynecology

## 2020-01-02 ENCOUNTER — Ambulatory Visit (INDEPENDENT_AMBULATORY_CARE_PROVIDER_SITE_OTHER): Payer: Managed Care, Other (non HMO)

## 2020-01-02 ENCOUNTER — Other Ambulatory Visit: Payer: Self-pay

## 2020-01-02 DIAGNOSIS — R0600 Dyspnea, unspecified: Secondary | ICD-10-CM | POA: Diagnosis not present

## 2020-01-02 DIAGNOSIS — R0609 Other forms of dyspnea: Secondary | ICD-10-CM

## 2020-01-02 DIAGNOSIS — R079 Chest pain, unspecified: Secondary | ICD-10-CM | POA: Diagnosis not present

## 2020-01-02 LAB — ECHOCARDIOGRAM COMPLETE
AR max vel: 2.44 cm2
AV Area VTI: 2.22 cm2
AV Area mean vel: 2.17 cm2
AV Mean grad: 5 mmHg
AV Peak grad: 9.4 mmHg
Ao pk vel: 1.53 m/s
Area-P 1/2: 4.8 cm2
Calc EF: 59 %
S' Lateral: 2.5 cm
Single Plane A2C EF: 57.4 %
Single Plane A4C EF: 61 %

## 2020-01-09 ENCOUNTER — Encounter: Payer: Self-pay | Admitting: Vascular Surgery

## 2020-01-09 ENCOUNTER — Ambulatory Visit (INDEPENDENT_AMBULATORY_CARE_PROVIDER_SITE_OTHER): Payer: Managed Care, Other (non HMO) | Admitting: Vascular Surgery

## 2020-01-09 ENCOUNTER — Other Ambulatory Visit: Payer: Self-pay

## 2020-01-09 ENCOUNTER — Ambulatory Visit (HOSPITAL_COMMUNITY)
Admission: RE | Admit: 2020-01-09 | Discharge: 2020-01-09 | Disposition: A | Discharge status: Home / Self Care | Payer: Managed Care, Other (non HMO) | Source: Ambulatory Visit | Attending: Vascular Surgery | Admitting: Vascular Surgery

## 2020-01-09 VITALS — BP 131/85 | HR 73 | Temp 97.9°F | Resp 20 | Ht 62.0 in | Wt 190.0 lb

## 2020-01-09 DIAGNOSIS — M79605 Pain in left leg: Secondary | ICD-10-CM

## 2020-01-09 DIAGNOSIS — M79604 Pain in right leg: Secondary | ICD-10-CM

## 2020-01-09 NOTE — Progress Notes (Signed)
Patient ID: Sherry Hobbs, female   DOB: Aug 08, 1970, 49 y.o.   MRN: [id number]  Reason for Consult: New Patient (Initial Visit)   Referred by Marval Regal, NP  Subjective:     HPI:  Sherry Hobbs is a 49 y.o. female history of diabetes and also has history of back surgery.  She complains of leg pain typically its in the middle of the night she has cramping.  She does not have any cramping with walking.  She does not have any frank rest pain.  She denies any tissue loss or ulceration.  She does have a father and brother who passed away at young ages in their 22s from coronary artery disease and her mother also has peripheral vascular disease having had lung cancer stenting of her left lower extremity as well as bypass in her left lower extremity.  All of these family members were smokers.  Patient is a lifelong non-smoker.  Risk factors for vascular disease include diabetes.  She is not on statin or antiplatelet therapy.  Recently had anemia underwent hysterectomy is currently on vitamin B injections.  Past Medical History:  Diagnosis Date  . Anemia    problems when having abnmornal uterine bleeding.  . Arthritis    lower back  . Depression   . Diabetes mellitus without complication ([place])   . DM (diabetes mellitus) ([place]) 08/07/2019  . Elevated serum creatinine   . GERD (gastroesophageal reflux disease)    OCC  NO MEDS  . Headache    occasional migraines  . History of blood transfusion 05/02/2016   due to abnormal uterine bleeding  . HTN (hypertension) 08/07/2019  . Hypertension   . Neuropathy    feet   Family History  Problem Relation Age of Onset  . Diabetes Mother   . COPD Mother   . Emphysema Mother   . Osteoporosis Mother   . Lung cancer Mother 29  . Breast cancer Mother   . CAD Father   . Diabetes Sister   . Diabetes Brother   . CAD Brother   . Celiac disease Brother   . Renal cancer Maternal Uncle        1 uncle  . Brain cancer Maternal Uncle         different uncle  . Diabetes Maternal Grandmother   . Stroke Maternal Grandmother   . Bladder Cancer Maternal Grandfather   . Diabetes Brother   . Heart disease Brother   . Cancer Sister   . Nevi Sister    Past Surgical History:  Procedure Laterality Date  . ABDOMINAL HYSTERECTOMY    . BACK SURGERY    . DILATION AND CURETTAGE OF UTERUS  05/03/2016   Procedure: DILATATION AND CURETTAGE;  Surgeon: [name], MD;  Location: [facility] OR[facility];  Service: Gynecology;;  . Murrell Redden & CURRETTAGE/HYSTROSCOPY WITH NOVASURE ABLATION N/A 12/03/2017   Procedure: DILATATION & CURETTAGE/HYSTEROSCOPY WITH MINERVA ABLATION;  Surgeon: [name], MD;  Location: [facility];  Service: Gynecology;  Laterality: N/A;  MINERVA REP CONTACTED [facility] VUONG [phone])  . LAPAROSCOPIC ASSISTED VAGINAL HYSTERECTOMY N/A 07/07/2019   Procedure: LAPAROSCOPIC ASSISTED VAGINAL HYSTERECTOMY;  Surgeon: [name], MD;  Location: [facility] ORS;  Service: Gynecology;  Laterality: N/A;  possible TVH  . LAPAROSCOPIC BILATERAL SALPINGECTOMY Bilateral 07/07/2019   Procedure: LAPAROSCOPIC BILATERAL SALPINGECTOMY;  Surgeon: [name], MD;  Location: [facility];  Service: Gynecology;  Laterality: Bilateral;  . LUMBAR LAMINECTOMY/DECOMPRESSION MICRODISCECTOMY Left 06/24/2018   Procedure: LUMBAR HEMILAMINECTOMY, FORAMINOTOMY, CYST REMOVAL -  1 LEVEL LEFT L4-5;  Surgeon: [name], MD;  Location: [facility] ORS;  Service: Neurosurgery;  Laterality: Left;    Short Social History:  Social History   Tobacco Use  . Smoking status: Never Smoker  . Smokeless tobacco: Never Used  Substance Use Topics  . Alcohol use: Yes    Comment: occassional    Allergies  Allergen Reactions  . Codeine Nausea And Vomiting    Current Outpatient Medications  Medication Sig Dispense Refill  . cholecalciferol (VITAMIN D3) 25 MCG (1000 UNIT) tablet Take 1,000 Units by mouth daily.    . cyanocobalamin (,VITAMIN B-12,) 1000 MCG/ML injection 1000 mcg (1 mg)  injection once per week for four weeks, followed by 1000 mcg injection once per month. (Patient taking differently: Inject 1,000 mcg into the muscle every 30 (thirty) days. ) 1 mL 15  . docusate sodium (COLACE) 100 MG capsule Take 100 mg by mouth 2 (two) times daily as needed for mild constipation.    . ferrous sulfate 325 (65 FE) MG tablet Take 325 mg by mouth 2 (two) times daily.    . hydrocortisone 2.5 % cream Apply twice daily to affected area with rash, for up to 1 to 2 weeks only 30 g 3  . ibuprofen (ADVIL) 800 MG tablet Take 1 tablet (800 mg total) by mouth every 8 (eight) hours as needed. 40 tablet 0  . ketoconazole (NIZORAL) 2 % cream Apply twice daily to affected area with rash, for up to 1 to 2 weeks only 60 g 11  . lisinopril (ZESTRIL) 10 MG tablet Take 1 tablet (10 mg total) by mouth every morning. 90 tablet 1  . metFORMIN (GLUCOPHAGE) 500 MG tablet Take 2 tablets (1,000 mg total) by mouth 2 (two) times daily with a meal. 180 tablet 1  . methocarbamol (ROBAXIN) 750 MG tablet Take 1 tablet (750 mg total) by mouth every 8 (eight) hours as needed for muscle spasms. 90 tablet 1  . metoprolol tartrate (LOPRESSOR) 100 MG tablet Take 1 tablet (100 mg) by mouth 2 hours prior to Cardiac CT 1 tablet 0  . pantoprazole (PROTONIX) 20 MG tablet Take 1 tablet (20 mg total) by mouth daily. 30 tablet 1  . pregabalin (LYRICA) 50 MG capsule Take 50 mg by mouth 3 (three) times daily.    Marland Kitchen triamcinolone cream (KENALOG) 0.1 % Apply to affected areas where itchy rash twice daily until rash is gone,  stop when gone. Avoid Face, groin, and underarms 453 g 2  . sucralfate (CARAFATE) 1 g tablet Take 1 tablet (1 g total) by mouth 4 (four) times daily for 15 days. 60 tablet 0   No current facility-administered medications for this visit.    Review of Systems  Constitutional:  Constitutional negative. HENT: HENT negative.  Respiratory: Respiratory negative.  Cardiovascular: Cardiovascular negative.  GI:  Gastrointestinal negative.  Musculoskeletal: Positive for leg pain.       Leg cramping Neurological: Neurological negative. Psychiatric: Psychiatric negative.        Objective:  Objective   Vitals:   01/09/20 0930  BP: 131/85  Pulse: 73  Resp: 20  Temp: 97.9 F (36.6 C)  SpO2: 96%  Weight: 190 lb (86.2 kg)  Height: 5\' 2"  (1.575 m)   Body mass index is 34.75 kg/m.  Physical Exam Constitutional:      Appearance: Normal appearance.  HENT:     Head: Normocephalic.     Nose:     Comments: Wearing a mask Eyes:  Extraocular Movements: Extraocular movements intact.     Pupils: Pupils are equal, round, and reactive to light.  Neck:     Vascular: No carotid bruit.  Cardiovascular:     Rate and Rhythm: Normal rate and regular rhythm.     Pulses: Normal pulses.  Pulmonary:     Effort: Pulmonary effort is normal.     Breath sounds: Normal breath sounds.  Abdominal:     Palpations: Abdomen is soft.  Musculoskeletal:        General: Normal range of motion.     Comments: Trace ankle pitting edema  Skin:    General: Skin is warm and dry.     Capillary Refill: Capillary refill takes less than 2 seconds.  Neurological:     General: No focal deficit present.     Mental Status: She is alert.  Psychiatric:        Mood and Affect: Mood normal.        Behavior: Behavior normal.        Thought Content: Thought content normal.        Judgment: Judgment normal.     Data: I have independently interpreted her ABIs to be greater than 1 and triphasic bilaterally.  Toe pressure on the right   147 and left is 158  Assessment/Plan:     49 year old female presents for evaluation of bilateral lower extremity cramping pain.  This does not appear to be vascular in nature.  She has been undergoing vitamin B injections which may be related.  She is also had back surgery in the past which did not relieve her issues.  Either way she does not need vascular invention we discussed her risk  factors including diabetes and strong family history.  She can follow-up on an as-needed basis.     Waynetta Sandy MD [facility]

## 2020-01-12 ENCOUNTER — Encounter: Payer: Self-pay | Admitting: Cardiology

## 2020-01-12 ENCOUNTER — Other Ambulatory Visit: Payer: Self-pay

## 2020-01-12 ENCOUNTER — Ambulatory Visit (INDEPENDENT_AMBULATORY_CARE_PROVIDER_SITE_OTHER): Payer: Managed Care, Other (non HMO) | Admitting: Cardiology

## 2020-01-12 VITALS — BP 136/78 | HR 85 | Ht 62.0 in | Wt 191.8 lb

## 2020-01-12 DIAGNOSIS — I10 Essential (primary) hypertension: Secondary | ICD-10-CM | POA: Diagnosis not present

## 2020-01-12 DIAGNOSIS — R911 Solitary pulmonary nodule: Secondary | ICD-10-CM | POA: Diagnosis not present

## 2020-01-12 DIAGNOSIS — R079 Chest pain, unspecified: Secondary | ICD-10-CM

## 2020-01-12 NOTE — Progress Notes (Signed)
Cardiology Office Note:    Date:  01/12/2020   ID:  Sherry Hobbs, DOB [birth year], MRN [id number]  PCP:  Marval Regal, NP  [facility] Cardiologist:  No primary care provider on file.  [facility] Electrophysiologist:  None   Referring MD: Marval Regal, NP   Chief Complaint  Patient presents with  . Follow-up    Follow up for testing results. Medications verbally reviewed with patient.     History of Present Illness:    Sherry Hobbs is a 49 y.o. female with a hx of hypertension, diabetes who presents for follow-up.  She was last seen due to chest pain consistent with angina and dyspnea on exertion.  Due to risk factors including family history of 2 brothers having MIs in their 16s, echocardiogram and coronary CTA was ordered to evaluate cardiac function and presence of CAD.  Patient states her symptoms of chest discomfort has improved since last visit.  She denies smoking but states being exposed to secondhand smoke because her mom smokes and has a diagnosis of lung cancer.   Past Medical History:  Diagnosis Date  . Anemia    problems when having abnmornal uterine bleeding.  . Arthritis    lower back  . Depression   . Diabetes mellitus without complication ([place])   . DM (diabetes mellitus) ([place]) 08/07/2019  . Elevated serum creatinine   . GERD (gastroesophageal reflux disease)    OCC  NO MEDS  . Headache    occasional migraines  . History of blood transfusion 05/02/2016   due to abnormal uterine bleeding  . HTN (hypertension) 08/07/2019  . Hypertension   . Neuropathy    feet    Past Surgical History:  Procedure Laterality Date  . ABDOMINAL HYSTERECTOMY    . BACK SURGERY    . DILATION AND CURETTAGE OF UTERUS  05/03/2016   Procedure: DILATATION AND CURETTAGE;  Surgeon: [name], MD;  Location: [facility];  Service: Gynecology;;  . Murrell Redden & CURRETTAGE/HYSTROSCOPY WITH NOVASURE ABLATION N/A 12/03/2017   Procedure: DILATATION &  CURETTAGE/HYSTEROSCOPY WITH MINERVA ABLATION;  Surgeon: [name], MD;  Location: [facility];  Service: Gynecology;  Laterality: N/A;  MINERVA REP CONTACTED [facility] VUONG [phone])  . LAPAROSCOPIC ASSISTED VAGINAL HYSTERECTOMY N/A 07/07/2019   Procedure: LAPAROSCOPIC ASSISTED VAGINAL HYSTERECTOMY;  Surgeon: [name], MD;  Location: [facility];  Service: Gynecology;  Laterality: N/A;  possible TVH  . LAPAROSCOPIC BILATERAL SALPINGECTOMY Bilateral 07/07/2019   Procedure: LAPAROSCOPIC BILATERAL SALPINGECTOMY;  Surgeon: [name], MD;  Location: [facility];  Service: Gynecology;  Laterality: Bilateral;  . LUMBAR LAMINECTOMY/DECOMPRESSION MICRODISCECTOMY Left 06/24/2018   Procedure: LUMBAR HEMILAMINECTOMY, FORAMINOTOMY, CYST REMOVAL - 1 LEVEL LEFT L4-5;  Surgeon: [name], MD;  Location: [facility] OR[facility];  Service: Neurosurgery;  Laterality: Left;    Current Medications: Current Meds  Medication Sig  . cholecalciferol (VITAMIN D3) 25 MCG (1000 UNIT) tablet Take 1,000 Units by mouth daily.  . cyanocobalamin (,VITAMIN B-12,) 1000 MCG/ML injection 1000 mcg (1 mg) injection once per week for four weeks, followed by 1000 mcg injection once per month. (Patient taking differently: Inject 1,000 mcg into the muscle every 30 (thirty) days. )  . docusate sodium (COLACE) 100 MG capsule Take 100 mg by mouth 2 (two) times daily as needed for mild constipation.  . ferrous sulfate 325 (65 FE) MG tablet Take 325 mg by mouth 2 (two) times daily.  . hydrocortisone 2.5 % cream Apply twice daily to affected area with rash, for up  to 1 to 2 weeks only  . ibuprofen (ADVIL) 800 MG tablet Take 1 tablet (800 mg total) by mouth every 8 (eight) hours as needed.  Marland Kitchen ketoconazole (NIZORAL) 2 % cream Apply twice daily to affected area with rash, for up to 1 to 2 weeks only  . lisinopril (ZESTRIL) 10 MG tablet Take 1 tablet (10 mg total) by mouth every morning.  . metFORMIN (GLUCOPHAGE) 500 MG tablet Take 2 tablets (1,000 mg total)  by mouth 2 (two) times daily with a meal.  . methocarbamol (ROBAXIN) 750 MG tablet Take 1 tablet (750 mg total) by mouth every 8 (eight) hours as needed for muscle spasms.  . metoprolol tartrate (LOPRESSOR) 100 MG tablet Take 1 tablet (100 mg) by mouth 2 hours prior to Cardiac CT  . pantoprazole (PROTONIX) 20 MG tablet Take 1 tablet (20 mg total) by mouth daily.  . pregabalin (LYRICA) 50 MG capsule Take 50 mg by mouth 3 (three) times daily.  Marland Kitchen triamcinolone cream (KENALOG) 0.1 % Apply to affected areas where itchy rash twice daily until rash is gone,  stop when gone. Avoid Face, groin, and underarms     Allergies:   Codeine   Social History   Socioeconomic History  . Marital status: Married    Spouse name: Sherry Hobbs  . Number of children: Not on file  . Years of education: Not on file  . Highest education level: Not on file  Occupational History  . Occupation: Solicitor  Tobacco Use  . Smoking status: Never Smoker  . Smokeless tobacco: Never Used  Vaping Use  . Vaping Use: Never used  Substance and Sexual Activity  . Alcohol use: Yes    Comment: occassional  . Drug use: No  . Sexual activity: Not Currently    Birth control/protection: None  Other Topics Concern  . Not on file  Social History Narrative  . Not on file   Social Determinants of Health   Financial Resource Strain:   . Difficulty of Paying Living Expenses: Not on file  Food Insecurity:   . Worried About Charity fundraiser in the Last Year: Not on file  . Ran Out of Food in the Last Year: Not on file  Transportation Needs:   . Lack of Transportation (Medical): Not on file  . Lack of Transportation (Non-Medical): Not on file  Physical Activity:   . Days of Exercise per Week: Not on file  . Minutes of Exercise per Session: Not on file  Stress:   . Feeling of Stress : Not on file  Social Connections:   . Frequency of Communication with Friends and Family: Not on file  . Frequency of Social  Gatherings with Friends and Family: Not on file  . Attends Religious Services: Not on file  . Active Member of Clubs or Organizations: Not on file  . Attends Archivist Meetings: Not on file  . Marital Status: Not on file     Family History: The patient's family history includes Bladder Cancer in her maternal grandfather; Brain cancer in her maternal uncle; Breast cancer in her mother; CAD in her brother and father; COPD in her mother; Cancer in her sister; Celiac disease in her brother; Diabetes in her brother, brother, maternal grandmother, mother, and sister; Emphysema in her mother; Heart disease in her brother; Lung cancer (age of onset: 86) in her mother; Nevi in her sister; Osteoporosis in her mother; Renal cancer in her maternal uncle; Stroke in her maternal  grandmother.  ROS:   Please see the history of present illness.     All other systems reviewed and are negative.  EKGs/Labs/Other Studies Reviewed:    The following studies were reviewed today:   EKG:  EKG is  ordered today.  The ekg ordered today demonstrates normal sinus rhythm, normal ECG.  Recent Labs: 08/07/2019: TSH 2.48 11/21/2019: ALT 38; BUN 14; Hemoglobin 12.3; Platelets 220; Potassium 4.3; Sodium 136 12/25/2019: Creatinine, Ser 1.20  Recent Lipid Panel    Component Value Date/Time   CHOL 161 11/13/2019 0905   TRIG 279.0 (H) 11/13/2019 0905   HDL 31.30 (L) 11/13/2019 0905   CHOLHDL 5 11/13/2019 0905   VLDL 55.8 (H) 11/13/2019 0905   LDLDIRECT 85.0 11/13/2019 0905    Physical Exam:    VS:  BP 136/78 (BP Location: Left Arm, Patient Position: Sitting, Cuff Size: Normal)   Pulse 85   Ht 5\' 2"  (1.575 m)   Wt 191 lb 12.8 oz (87 kg)   LMP 11/26/2017   SpO2 98%   BMI 35.08 kg/m     Wt Readings from Last 3 Encounters:  01/12/20 191 lb 12.8 oz (87 kg)  01/09/20 190 lb (86.2 kg)  12/18/19 190 lb (86.2 kg)     GEN:  Well nourished, well developed in no acute distress HEENT: Normal NECK: No JVD;  No carotid bruits LYMPHATICS: No lymphadenopathy CARDIAC: RRR, no murmurs, rubs, gallops RESPIRATORY:  Clear to auscultation without rales, wheezing or rhonchi  ABDOMEN: Soft, non-tender, non-distended MUSCULOSKELETAL:  No edema; No deformity  SKIN: Warm and dry NEUROLOGIC:  Alert and oriented x 3 PSYCHIATRIC:  Normal affect   ASSESSMENT:    1. Chest pain of uncertain etiology   2. Essential hypertension   3. Pulmonary nodule    PLAN:    In order of problems listed above:  1. Patient with history of chest pain, has risk factors of hypertension, diabetes, family history of CAD.  Echocardiogram showed normal systolic and diastolic function, EF 60 to 65%.  Mild MR. coronary CTA showed a calcium score of 0, no evidence of CAD.  Patient made aware of findings and reassured. 2. patient with history of hypertension, blood pressure controlled.  Continue lisinopril as prescribed. 3. A 3 mm pulmonary nodule noted on CT scan, unchanged from prior.  Patient plans to follow-up with primary care provider regarding this.  Follow-up as needed.   Medication Adjustments/Labs and Tests Ordered: Current medicines are reviewed at length with the patient today.  Concerns regarding medicines are outlined above.  Orders Placed This Encounter  Procedures  . EKG 12-Lead   No orders of the defined types were placed in this encounter.   Patient Instructions  Medication Instructions:  Your physician recommends that you continue on your current medications as directed. Please refer to the Current Medication list given to you today.  *If you need a refill on your cardiac medications before your next appointment, please call your pharmacy*   Lab Work: None ordered If you have labs (blood work) drawn today and your tests are completely normal, you will receive your results only by: Marland Kitchen MyChart Message (if you have MyChart) OR . A paper copy in the mail If you have any lab test that is abnormal or we  need to change your treatment, we will call you to review the results.   Testing/Procedures: None ordered   Follow-Up: At [facility], you and your health needs are our priority.  As part of our  continuing mission to provide you with exceptional heart care, we have created designated Provider Care Teams.  These Care Teams include your primary Cardiologist (physician) and Advanced Practice Providers (APPs -  Physician Assistants and Nurse Practitioners) who all work together to provide you with the care you need, when you need it.  We recommend signing up for the patient portal called "MyChart".  Sign up information is provided on this After Visit Summary.  MyChart is used to connect with patients for Virtual Visits (Telemedicine).  Patients are able to view lab/test results, encounter notes, upcoming appointments, etc.  Non-urgent messages can be sent to your provider as well.   To learn more about what you can do with MyChart, go to [email or web address].    Your next appointment:   Follow up as needed   The format for your next appointment:   In Person  Provider:   Kate Sable, MD   Other Instructions      Signed, Kate Sable, MD  01/12/2020 5:30 PM    [place]

## 2020-01-12 NOTE — Patient Instructions (Signed)

## 2020-01-14 ENCOUNTER — Ambulatory Visit (INDEPENDENT_AMBULATORY_CARE_PROVIDER_SITE_OTHER): Payer: Managed Care, Other (non HMO) | Admitting: Nurse Practitioner

## 2020-01-14 ENCOUNTER — Encounter: Payer: Self-pay | Admitting: Nurse Practitioner

## 2020-01-14 ENCOUNTER — Other Ambulatory Visit: Payer: Self-pay

## 2020-01-14 VITALS — BP 132/80 | HR 92 | Temp 97.8°F | Ht 62.0 in | Wt 192.0 lb

## 2020-01-14 DIAGNOSIS — R911 Solitary pulmonary nodule: Secondary | ICD-10-CM | POA: Diagnosis not present

## 2020-01-14 DIAGNOSIS — M79605 Pain in left leg: Secondary | ICD-10-CM

## 2020-01-14 DIAGNOSIS — Z809 Family history of malignant neoplasm, unspecified: Secondary | ICD-10-CM

## 2020-01-14 DIAGNOSIS — K219 Gastro-esophageal reflux disease without esophagitis: Secondary | ICD-10-CM

## 2020-01-14 DIAGNOSIS — K760 Fatty (change of) liver, not elsewhere classified: Secondary | ICD-10-CM

## 2020-01-14 DIAGNOSIS — IMO0001 Reserved for inherently not codable concepts without codable children: Secondary | ICD-10-CM

## 2020-01-14 DIAGNOSIS — M79604 Pain in right leg: Secondary | ICD-10-CM

## 2020-01-14 DIAGNOSIS — E785 Hyperlipidemia, unspecified: Secondary | ICD-10-CM

## 2020-01-14 MED ORDER — ROSUVASTATIN CALCIUM 10 MG PO TABS
10.0000 mg | ORAL_TABLET | Freq: Every day | ORAL | 3 refills | Status: DC
Start: 2020-01-14 — End: 2021-02-16

## 2020-01-14 NOTE — Patient Instructions (Addendum)
We have added Crestor 10 mg for high cholesterol and to help protect you from MI and stroke in the future.  Labs in 2 mos and office visit to follow.   For your concern about pulmonary  Nodule- Referral to Lung specialist.  Reassurance provided.  For your family Hx of cancer-to  genetic counselor for Lynch syndrome discussion.  You may cut back on your Carafate to as needed.  Continue with B12 monthly injections.  Continue with vitamin D supplementation.  Continue with all weight loss and exercise goals.  Follow-up office visit in 3 months.     Fatty Liver Disease  Fatty liver disease occurs when too much fat has built up in your liver cells. Fatty liver disease is also called hepatic steatosis or steatohepatitis. The liver removes harmful substances from your bloodstream and produces fluids that your body needs. It also helps your body use and store energy from the food you eat. In many cases, fatty liver disease does not cause symptoms or problems. It is often diagnosed when tests are being done for other reasons. However, over time, fatty liver can cause inflammation that may lead to more serious liver problems, such as scarring of the liver (cirrhosis) and liver failure. Fatty liver is associated with insulin resistance, increased body fat, high blood pressure (hypertension), and high cholesterol. These are features of metabolic syndrome and increase your risk for stroke, diabetes, and heart disease. What are the causes? This condition may be caused by:  Drinking too much alcohol.  Poor nutrition.  Obesity.  Cushing's syndrome.  Diabetes.  High cholesterol.  Certain drugs.  Poisons.  Some viral infections.  Pregnancy. What increases the risk? You are more likely to develop this condition if you:  Abuse alcohol.  Are overweight.  Have diabetes.  Have hepatitis.  Have a high triglyceride level.  Are pregnant. What are the signs or symptoms? Fatty liver  disease often does not cause symptoms. If symptoms do develop, they can include:  Fatigue.  Weakness.  Weight loss.  Confusion.  Abdominal pain.  Nausea and vomiting.  Yellowing of your skin and the white parts of your eyes (jaundice).  Itchy skin. How is this diagnosed? This condition may be diagnosed by:  A physical exam and medical history.  Blood tests.  Imaging tests, such as an ultrasound, CT scan, or MRI.  A liver biopsy. A small sample of liver tissue is removed using a needle. The sample is then looked at under a microscope. How is this treated? Fatty liver disease is often caused by other health conditions. Treatment for fatty liver may involve medicines and lifestyle changes to manage conditions such as:  Alcoholism.  High cholesterol.  Diabetes.  Being overweight or obese. Follow these instructions at home:   Do not drink alcohol. If you have trouble quitting, ask your health care provider how to safely quit with the help of medicine or a supervised program. This is important to keep your condition from getting worse.  Eat a healthy diet as told by your health care provider. Ask your health care provider about working with a diet and nutrition specialist (dietitian) to develop an eating plan.  Exercise regularly. This can help you lose weight and control your cholesterol and diabetes. Talk to your health care provider about an exercise plan and which activities are best for you.  Take over-the-counter and prescription medicines only as told by your health care provider.  Keep all follow-up visits as told by your health  care provider. This is important. Contact a health care provider if: You have trouble controlling your:  Blood sugar. This is especially important if you have diabetes.  Cholesterol.  Drinking of alcohol. Get help right away if:  You have abdominal pain.  You have jaundice.  You have nausea and vomiting.  You vomit blood or  material that looks like coffee grounds.  You have stools that are black, tar-like, or bloody. Summary  Fatty liver disease develops when too much fat builds up in the cells of your liver.  Fatty liver disease often causes no symptoms or problems. However, over time, fatty liver can cause inflammation that may lead to more serious liver problems, such as scarring of the liver (cirrhosis).  You are more likely to develop this condition if you abuse alcohol, are pregnant, are overweight, have diabetes, have hepatitis, or have high triglyceride levels.  Contact your health care provider if you have trouble controlling your weight, blood sugar, cholesterol, or drinking of alcohol. This information is not intended to replace advice given to you by your health care provider. Make sure you discuss any questions you have with your health care provider. Document Revised: 04/13/2017 Document Reviewed: 02/07/2017 Elsevier Patient Education  2020 [place].  Preventing High Cholesterol Cholesterol is a white, waxy substance similar to fat that the human body needs to help build cells. The liver makes all the cholesterol that a person's body needs. Having high cholesterol (hypercholesterolemia) increases a person's risk for heart disease and stroke. Extra (excess) cholesterol comes from the food the person eats. High cholesterol can often be prevented with diet and lifestyle changes. If you already have high cholesterol, you can control it with diet and lifestyle changes and with medicine. How can high cholesterol affect me? If you have high cholesterol, deposits (plaques) may build up on the walls of your arteries. The arteries are the blood vessels that carry blood away from your heart. Plaques make the arteries narrower and stiffer. This can limit or block blood flow and cause blood clots to form. Blood clots:  Are tiny balls of cells that form in your blood.  Can move to the heart or brain,  causing a heart attack or stroke. Plaques in arteries greatly increase your risk for heart attack and stroke.Making diet and lifestyle changes can reduce your risk for these conditions that may threaten your life. What can increase my risk? This condition is more likely to develop in people who:  Eat foods that are high in saturated fat or cholesterol. Saturated fat is mostly found in: ? Foods that contain animal fat, such as red meat and some dairy products. ? Certain fatty foods made from plants, such as tropical oils.  Are overweight.  Are not getting enough exercise.  Have a family history of high cholesterol. What actions can I take to prevent this? Nutrition   Eat less saturated fat.  Avoid trans fats (partially hydrogenated oils). These are often found in margarine and in some baked goods, fried foods, and snacks bought in packages.  Avoid precooked or cured meat, such as sausages or meat loaves.  Avoid foods and drinks that have added sugars.  Eat more fruits, vegetables, and whole grains.  Choose healthy sources of protein, such as fish, poultry, lean cuts of red meat, beans, peas, lentils, and nuts.  Choose healthy sources of fat, such as: ? Nuts. ? Vegetable oils, especially olive oil. ? Fish that have healthy fats (omega-3 fatty acids), such as  mackerel or salmon. The items listed above may not be a complete list of recommended foods and beverages. Contact a dietitian for more information. Lifestyle  Lose weight if you are overweight. Losing 5-10 lb (2.3-4.5 kg) can help prevent or control high cholesterol. It can also lower your risk for diabetes and high blood pressure. Ask your health care provider to help you with a diet and exercise plan to lose weight safely.  Do not use any products that contain nicotine or tobacco, such as cigarettes, e-cigarettes, and chewing tobacco. If you need help quitting, ask your health care provider.  Limit your alcohol  intake. ? Do not drink alcohol if:  Your health care provider tells you not to drink.  You are pregnant, may be pregnant, or are planning to become pregnant. ? If you drink alcohol:  Limit how much you use to:  0-1 drink a day for women.  0-2 drinks a day for men.  Be aware of how much alcohol is in your drink. In the [place] one drink equals one 12 oz bottle of beer (355 mL), one 5 oz glass of wine (148 mL), or one 1 oz glass of hard liquor (44 mL). Activity   Get enough exercise. Each week, do at least 150 minutes of exercise that takes a medium level of effort (moderate-intensity exercise). ? This is exercise that:  Makes your heart beat faster and makes you breathe harder than usual.  Allows you to still be able to talk. ? You could exercise in short sessions several times a day or longer sessions a few times a week. For example, on 5 days each week, you could walk fast or ride your bike 3 times a day for 10 minutes each time.  Do exercises as told by your health care provider. Medicines  In addition to diet and lifestyle changes, your health care provider may recommend medicines to help lower cholesterol. This may be a medicine to lower the amount of cholesterol your liver makes. You may need medicine if: ? Diet and lifestyle changes do not lower your cholesterol enough. ? You have high cholesterol and other risk factors for heart disease or stroke.  Take over-the-counter and prescription medicines only as told by your health care provider. General information  Manage your risk factors for high cholesterol. Talk with your health care provider about all your risk factors and how to lower your risk.  Manage other conditions that you have, such as diabetes or high blood pressure (hypertension).  Have blood tests to check your cholesterol levels at regular points in time as told by your health care provider.  Keep all follow-up visits as told by your health care provider.  This is important. Where to find more information  American Heart Association: www.heart.org  National Heart, Lung, and Blood Institute: https://wilson-[place].com/ Summary  High cholesterol increases your risk for heart disease and stroke. By keeping your cholesterol level low, you can reduce your risk for these conditions.  High cholesterol can often be prevented with diet and lifestyle changes.  Work with your health care provider to manage your risk factors, and have your blood tested regularly. This information is not intended to replace advice given to you by your health care provider. Make sure you discuss any questions you have with your health care provider. Document Revised: 08/23/2018 Document Reviewed: 01/08/2016 Elsevier Patient Education  [place].  Pulmonary Nodule A pulmonary nodule is a small, round growth of tissue in the lung. It  is sometimes referred to as a "shadow" or "spot on the lung." Nodules range in size from less than 1/5 of an inch (4 mm) to a little bigger than an inch (30 mm). Pulmonary nodules can be either noncancerous (benign) or cancerous (malignant). Most are noncancerous. Smaller nodules in people who do not smoke and do not have any other risk factors for lung cancer are more likely to be noncancerous. Larger, irregular nodules in people who smoke or who have a strong family history of lung cancer are more likely to be cancerous. What are the causes? This condition may be caused by:  A bacterial, fungal, or viral infection, such as tuberculosis. The infection is usually an old and inactive one.  A noncancerous mass of tissue.  Inflammation from conditions such as rheumatoid arthritis.  Abnormal blood vessels in the lungs.  Cancerous tissue, such as lung cancer or a cancer in another part of the body that has spread to the lung. What are the signs or symptoms? This condition usually does not cause symptoms. If symptoms appear, they are usually  related to the underlying cause. For example, if the condition is caused by an infection, you may have a cough or fever. How is this diagnosed? This condition is usually diagnosed with an X-ray or CT scan. To help determine whether a pulmonary nodule is benign or malignant, your health care provider will:  Take your medical history.  Perform a physical exam.  Order tests, including: ? Blood tests. ? A skin test called a tuberculin test. This test is done to check if you have been exposed to the germ that causes tuberculosis. ? Chest X-rays. ? A CT scan. This test shows smaller pulmonary nodules more clearly and with more detail than an X-ray. ? A positron emission tomography (PET) scan. This test is done to check if the nodule is cancerous. During the test, a safe amount of a radioactive substance is injected into the bloodstream. Then a picture is taken. ? Biopsy. In this test, a tiny piece of the pulmonary nodule is removed and then examined under a microscope. How is this treated? Treatment for this condition depends on whether the pulmonary nodule is malignant or benign as well as your risk of getting cancer.  Noncancerous nodules usually do not need to be treated, but they may need to be monitored with CT scans. If a CT scan shows that the pulmonary nodule got bigger, more tests may be done.  Some nodules need to be removed. If this is the case, you may have a procedure called a thoractomy. During the procedure, your health care provider will make an incision in your chest and remove the part of the lung where the nodule is located. Follow these instructions at home:   Take over-the-counter and prescription medicines only as told by your health care provider.  Do not use any products that contain nicotine or tobacco, such as cigarettes and e-cigarettes. If you need help quitting, ask your health care provider.  Keep all follow-up visits as told by your health care provider. This is  important. Contact a health care provider if:  You have trouble breathing when you are active.  You feel sick or unusually tired.  You do not feel like eating.  You lose weight without trying.  You develop chills or night sweats. Get help right away if:  You cannot catch your breath.  You begin wheezing.  You cannot stop coughing.  You cough up blood.  You become dizzy or feel like you are going to faint.  You have sudden chest pain.  You have a fever or persistent symptoms for more than 2-3 days.  You have a fever and your symptoms suddenly get worse. Summary  A pulmonary nodule is a small, round growth of tissue in the lung. Most pulmonary nodules are noncancerous.  This condition is usually diagnosed with an X-ray or CT scan.  Common causes of pulmonary nodules include infection, inflammation, and noncancerous growths.  Though less common, if a nodule is found to be cancerous, you will need specific diagnostic tests and treatment options as directed by your medical provider.  Treatment for this condition depends on whether the pulmonary nodule is benign or malignant as well as your risk of getting cancer. This information is not intended to replace advice given to you by your health care provider. Make sure you discuss any questions you have with your health care provider. Document Revised: 05/25/2017 Document Reviewed: 05/30/2016 Elsevier Patient Education  [place].

## 2020-01-14 NOTE — Progress Notes (Signed)
Established Patient Office Visit  Subjective:  Patient ID: Sherry Hobbs, female    DOB: 1971/02/10  Age: 49 y.o. MRN: [id number]  CC:  Chief Complaint  Patient presents with  . Follow-up    discuss CT scan results    HPI Sherry Hobbs is a 49 year old patient with history of hypertension, GERD, diabetes mellitus, anemia with Sherry Hobbs, right upper quadrant pain, DOE, chest pain, chronic back pain with radiculopathy, hepatic steatosis, hyperlipidemia, pelvic ultrasound showed possible septated ovarian cysts.    Since Sherry Hobbs was last seen via video visit on 11/28/2019, she has had consultation with cardiology for DOE and family history of CAD with her father passing age 23 from massive MI.  She has 2 brothers who have had MIs in their 69s.  She is a non-smoker.   Her mother has lung cancer.   She is concerned about the small nodule that was found in er lung.  She has been unaware of a lung nodule prior to this study.  She has no further concerns about DOE- needs more exercise.No CP.  12/25/2019:  She went underwent a coronary CTA : IMPRESSION: 1. No active cardiopulmonary abnormalities. 2. Hepatic steatosis. 3. 3 mm perifissural nodule noted in right middle lobe. Not changed from previous exam. No follow-up needed if patient is low-risk. Non-contrast chest CT can be considered in 12 months if patient is high-risk. This recommendation follows the consensus statement: Guidelines for Management of Incidental Pulmonary Nodules Detected on CT Images: From the Fleischner Society 2017; Radiology 2017; 284:228-243.  Essential hypertension: Adequate to fair control on lisinopril 10 mg daily.  BP Readings from Last 3 Encounters:  01/14/20 132/80  01/12/20 136/78  01/09/20 131/85    HLD/BMI35/Obesity/Hepatic steatosis: Currently not on a statin.  Working on diet and exercise. Lab Results  Component Value Date   CHOL 161 11/13/2019   HDL 31.30 (L) 11/13/2019   LDLDIRECT 85.0  11/13/2019   TRIG 279.0 (H) 11/13/2019   CHOLHDL 5 11/13/2019   Wt Readings from Last 3 Encounters:  01/14/20 192 lb (87.1 kg)  01/12/20 191 lb 12.8 oz (87 kg)  01/09/20 190 lb (86.2 kg)   Lab Results  Component Value Date   ALT 38 11/21/2019   AST 30 11/21/2019   ALKPHOS 90 11/21/2019   BILITOT 0.7 11/21/2019    Diabetes mellitus type 2: A1c 6.9 on Metformin 1000 mg BID Lab Results  Component Value Date   HGBA1C 6.9 (H) 11/13/2019   GERD: Currently well controlled on Protonix and Carafate every day.  Her right upper quadrant pain was thought to be due to eating a lot of cherries at one time and gas pain.    Previous GI evaluation showed esophagitis gastritis and duodenitis.  Chronic back pain with radiculopathy: Since she was last seen she has been followed by the pain clinic, and is on Lyrica 50 mg 3 times daily.  Robaxin, , less Advil, She has been having some right leg pain recently, not on the left.  She has had epidurals in the past.  She still working out with the pain physicians.  No new complaints at this time.  Ovarian cysts: These were found on CT of the abdomen which prompted pelvic ultrasound in July.  The cysts are described as simple unilocular cyst, possibly representing follicular cyst.  Given their size, further follow-up is not required in the premenopausal patient. She is followed by Dr. Marcelline Mates GYN and most recently saw her in April for  follow-up after  laparoscopic assisted vaginal hysterectomy for abnormal uterine bleeding.  Patient had iron deficiency anemia.  She had received IV iron therapy.  Hemoglobin is normal 12.3.   B12 and Vit D def, Anemia: On supplements:  B12 injections : 482, iron in March 18% sat- will need rechecked on ferrous 325 mg BID, Vit D 1000 IU daily- needs rechecked.     Past Medical History:  Diagnosis Date  . Anemia    problems when having abnmornal uterine bleeding.  . Arthritis    lower back  . Depression   . Diabetes mellitus  without complication ([place])   . DM (diabetes mellitus) ([place]) 08/07/2019  . Dysuria 12/01/2019  . Elevated serum creatinine   . GERD (gastroesophageal reflux disease)    OCC  NO MEDS  . Headache    occasional migraines  . History of blood transfusion 05/02/2016   due to abnormal uterine bleeding  . HTN (hypertension) 08/07/2019  . Hypertension   . Neuropathy    feet    Past Surgical History:  Procedure Laterality Date  . ABDOMINAL HYSTERECTOMY    . BACK SURGERY    . DILATION AND CURETTAGE OF UTERUS  05/03/2016   Procedure: DILATATION AND CURETTAGE;  Surgeon: [name], MD;  Location: [facility];  Service: Gynecology;;  . Murrell Redden & CURRETTAGE/HYSTROSCOPY WITH NOVASURE ABLATION N/A 12/03/2017   Procedure: DILATATION & CURETTAGE/HYSTEROSCOPY WITH MINERVA ABLATION;  Surgeon: [name], MD;  Location: [facility];  Service: Gynecology;  Laterality: N/A;  MINERVA REP CONTACTED [facility] [phone])  . LAPAROSCOPIC ASSISTED VAGINAL HYSTERECTOMY N/A 07/07/2019   Procedure: LAPAROSCOPIC ASSISTED VAGINAL HYSTERECTOMY;  Surgeon: [name], MD;  Location: [facility] OR[facility];  Service: Gynecology;  Laterality: N/A;  possible TVH  . LAPAROSCOPIC BILATERAL SALPINGECTOMY Bilateral 07/07/2019   Procedure: LAPAROSCOPIC BILATERAL SALPINGECTOMY;  Surgeon: [name], MD;  Location: [facility];  Service: Gynecology;  Laterality: Bilateral;  . LUMBAR LAMINECTOMY/DECOMPRESSION MICRODISCECTOMY Left 06/24/2018   Procedure: LUMBAR HEMILAMINECTOMY, FORAMINOTOMY, CYST REMOVAL - 1 LEVEL LEFT L4-5;  Surgeon: [name], MD;  Location: [facility];  Service: Neurosurgery;  Laterality: Left;    Family History  Problem Relation Age of Onset  . Diabetes Mother   . COPD Mother   . Emphysema Mother   . Osteoporosis Mother   . Lung cancer Mother 53  . Breast cancer Mother   . CAD Father   . Diabetes Sister   . Diabetes Brother   . CAD Brother   . Celiac disease Brother   . Renal cancer Maternal Uncle        1  uncle  . Brain cancer Maternal Uncle        different uncle  . Diabetes Maternal Grandmother   . Stroke Maternal Grandmother   . Bladder Cancer Maternal Grandfather   . Diabetes Brother   . Heart disease Brother   . Cancer Sister   . Nevi Sister     Social History   Socioeconomic History  . Marital status: Married    Spouse name: Gwyndolyn Saxon  . Number of children: Not on file  . Years of education: Not on file  . Highest education level: Not on file  Occupational History  . Occupation: Solicitor  Tobacco Use  . Smoking status: Never Smoker  . Smokeless tobacco: Never Used  Vaping Use  . Vaping Use: Never used  Substance and Sexual Activity  . Alcohol use: Yes    Comment: occassional  . Drug use: No  . Sexual  activity: Not Currently    Birth control/protection: None  Other Topics Concern  . Not on file  Social History Narrative  . Not on file   Social Determinants of Health   Financial Resource Strain:   . Difficulty of Paying Living Expenses: Not on file  Food Insecurity:   . Worried About Charity fundraiser in the Last Year: Not on file  . Ran Out of Food in the Last Year: Not on file  Transportation Needs:   . Lack of Transportation (Medical): Not on file  . Lack of Transportation (Non-Medical): Not on file  Physical Activity:   . Days of Exercise per Week: Not on file  . Minutes of Exercise per Session: Not on file  Stress:   . Feeling of Stress : Not on file  Social Connections:   . Frequency of Communication with Friends and Family: Not on file  . Frequency of Social Gatherings with Friends and Family: Not on file  . Attends Religious Services: Not on file  . Active Member of Clubs or Organizations: Not on file  . Attends Archivist Meetings: Not on file  . Marital Status: Not on file  Intimate Partner Violence:   . Fear of Current or Ex-Partner: Not on file  . Emotionally Abused: Not on file  . Physically Abused: Not on file   . Sexually Abused: Not on file    Outpatient Medications Prior to Visit  Medication Sig Dispense Refill  . cholecalciferol (VITAMIN D3) 25 MCG (1000 UNIT) tablet Take 1,000 Units by mouth daily.    . cyanocobalamin (,VITAMIN Hobbs-12,) 1000 MCG/ML injection 1000 mcg (1 mg) injection once per week for four weeks, followed by 1000 mcg injection once per month. (Patient taking differently: Inject 1,000 mcg into the muscle every 30 (thirty) days. ) 1 mL 15  . docusate sodium (COLACE) 100 MG capsule Take 100 mg by mouth 2 (two) times daily as needed for mild constipation.    . ferrous sulfate 325 (65 FE) MG tablet Take 325 mg by mouth 2 (two) times daily.    . hydrocortisone 2.5 % cream Apply twice daily to affected area with rash, for up to 1 to 2 weeks only 30 g 3  . ibuprofen (ADVIL) 800 MG tablet Take 1 tablet (800 mg total) by mouth every 8 (eight) hours as needed. 40 tablet 0  . ketoconazole (NIZORAL) 2 % cream Apply twice daily to affected area with rash, for up to 1 to 2 weeks only 60 g 11  . lisinopril (ZESTRIL) 10 MG tablet Take 1 tablet (10 mg total) by mouth every morning. 90 tablet 1  . metFORMIN (GLUCOPHAGE) 500 MG tablet Take 2 tablets (1,000 mg total) by mouth 2 (two) times daily with a meal. 180 tablet 1  . methocarbamol (ROBAXIN) 750 MG tablet Take 1 tablet (750 mg total) by mouth every 8 (eight) hours as needed for muscle spasms. 90 tablet 1  . pantoprazole (PROTONIX) 20 MG tablet Take 1 tablet (20 mg total) by mouth daily. 30 tablet 1  . pregabalin (LYRICA) 50 MG capsule Take 50 mg by mouth 3 (three) times daily.    Marland Kitchen triamcinolone cream (KENALOG) 0.1 % Apply to affected areas where itchy rash twice daily until rash is gone,  stop when gone. Avoid Face, groin, and underarms 453 g 2  . metoprolol tartrate (LOPRESSOR) 100 MG tablet Take 1 tablet (100 mg) by mouth 2 hours prior to Cardiac CT 1 tablet 0  .  sucralfate (CARAFATE) 1 g tablet Take 1 tablet (1 g total) by mouth 4 (four) times  daily for 15 days. 60 tablet 0   No facility-administered medications prior to visit.    Allergies  Allergen Reactions  . Codeine Nausea And Vomiting   Review of Systems  Constitutional: Negative for chills and fever.  HENT: Negative for congestion.   Eyes: Negative.   Respiratory: Negative for cough, chest tightness and shortness of breath.   Cardiovascular: Negative for chest pain, palpitations and leg swelling.  Gastrointestinal: Negative for abdominal pain, constipation and diarrhea.  Genitourinary: Negative.   Musculoskeletal: Positive for back pain.  Skin: Negative.   Allergic/Immunologic: Negative.   Neurological: Negative.   Hematological: Negative.   Psychiatric/Behavioral: Negative.       Objective:    Physical Exam Vitals reviewed.  Eyes:     Conjunctiva/sclera: Conjunctivae normal.     Pupils: Pupils are equal, round, and reactive to light.  Cardiovascular:     Rate and Rhythm: Normal rate and regular rhythm.     Pulses: Normal pulses.     Heart sounds: Normal heart sounds.  Pulmonary:     Effort: Pulmonary effort is normal.     Breath sounds: Normal breath sounds.  Abdominal:     Palpations: Abdomen is soft.     Tenderness: There is no abdominal tenderness.  Musculoskeletal:        General: Normal range of motion.     Cervical back: Normal range of motion and neck supple.  Skin:    General: Skin is warm and dry.  Neurological:     General: No focal deficit present.     Mental Status: She is alert and oriented to person, place, and time.  Psychiatric:        Mood and Affect: Mood normal.        Behavior: Behavior normal.     BP 132/80 (BP Location: Left Arm, Patient Position: Sitting, Cuff Size: Normal)   Pulse 92   Temp 97.8 F (36.6 C) (Oral)   Ht 5\' 2"  (1.575 m)   Wt 192 lb (87.1 kg)   LMP 11/26/2017   SpO2 99%   BMI 35.12 kg/m  Wt Readings from Last 3 Encounters:  01/14/20 192 lb (87.1 kg)  01/12/20 191 lb 12.8 oz (87 kg)  01/09/20  190 lb (86.2 kg)     Health Maintenance Due  Topic Date Due  . PNEUMOCOCCAL POLYSACCHARIDE VACCINE AGE 76-64 HIGH RISK  Never done  . FOOT EXAM  Never done  . OPHTHALMOLOGY EXAM  Never done  . HIV Screening  Never done  . PAP SMEAR-Modifier  Never done    There are no preventive care reminders to display for this patient.  Lab Results  Component Value Date   TSH 2.48 08/07/2019   Lab Results  Component Value Date   WBC 8.8 11/21/2019   HGB 12.3 11/21/2019   HCT 35.2 (L) 11/21/2019   MCV 82.6 11/21/2019   PLT 220 11/21/2019   Lab Results  Component Value Date   NA 136 11/21/2019   K 4.3 11/21/2019   CO2 26 11/21/2019   GLUCOSE 181 (H) 11/21/2019   BUN 14 11/21/2019   CREATININE 1.20 (H) 12/25/2019   BILITOT 0.7 11/21/2019   ALKPHOS 90 11/21/2019   AST 30 11/21/2019   ALT 38 11/21/2019   PROT 7.0 11/21/2019   ALBUMIN 3.5 11/21/2019   CALCIUM 9.0 11/21/2019   ANIONGAP 9 11/21/2019   GFR 55.67 (  L) 11/13/2019   Lab Results  Component Value Date   CHOL 161 11/13/2019   Lab Results  Component Value Date   HDL 31.30 (L) 11/13/2019   No results found for: [facility] Lab Results  Component Value Date   TRIG 279.0 (H) 11/13/2019   Lab Results  Component Value Date   CHOLHDL 5 11/13/2019   Lab Results  Component Value Date   HGBA1C 6.9 (H) 11/13/2019      Assessment & Plan:   Problem List Items Addressed This Visit      Digestive   GERD (gastroesophageal reflux disease)    Doing much better.  She had right upper quadrant pain after eating a lot of cherries.  She has been taking Carafate and Protonix.  No further upper GI complaints.  She may cut back on the Carafate and and use as needed.      Fatty liver    Begin Crestor, weight loss recommended nonalcohol drinker.  We will follow up ultrasound.       Relevant Orders   Comprehensive metabolic panel   Lipid panel     Other   Bilateral leg pain    Having right leg pain, taking Lyrica 50 mg 3 times  a day, followed by the pain clinic.  Lyrica seems to help, but it wears off at the end of the day.  Patient to continue to follow-up with pain clinic.      Lung nodule < 6cm on CT - Primary    For your concern about pulmonary  Nodule- Referral to Lung specialist.  Reassurance provided.      Relevant Orders   Ambulatory referral to Pulmonology   Hyperlipidemia    Continue with all weight loss and exercise goals. We have added Crestor 10 mg for high cholesterol and to help protect you from MI and stroke in the future.  Labs in 2 mos and office visit to follow.  Lab Results  Component Value Date   CHOL 161 11/13/2019   HDL 31.30 (L) 11/13/2019   LDLDIRECT 85.0 11/13/2019   TRIG 279.0 (H) 11/13/2019   CHOLHDL 5 11/13/2019        Relevant Medications   rosuvastatin (CRESTOR) 10 MG tablet   Other Relevant Orders   Comprehensive metabolic panel   Lipid panel   FH: cancer    Positive family history for sister with uterine malignancy in her 79s, 1 maternal uncle with brain cancer, another maternal uncle with renal cancer.  Mother with lung cancer and breast cancer.  For your family Hx of cancer-to  genetic counselor for Lynch syndrome discussion.      Relevant Orders   Ambulatory referral to Genetics      Meds ordered this encounter  Medications  . rosuvastatin (CRESTOR) 10 MG tablet    Sig: Take 1 tablet (10 mg total) by mouth daily.    Dispense:  90 tablet    Refill:  3    Order Specific Question:   Supervising Provider    Answer:   Einar Pheasant [[id number]]    Follow-up: Return in about 2 months (around 03/15/2020).  This visit occurred during the SARS-CoV-2 public health emergency.  Safety protocols were in place, including screening questions prior to the visit, additional usage of staff PPE, and extensive cleaning of exam room while observing appropriate contact time as indicated for disinfecting solutions.   Denice Paradise, NP

## 2020-01-15 ENCOUNTER — Encounter: Payer: Self-pay | Admitting: Nurse Practitioner

## 2020-01-15 DIAGNOSIS — IMO0001 Reserved for inherently not codable concepts without codable children: Secondary | ICD-10-CM | POA: Insufficient documentation

## 2020-01-15 DIAGNOSIS — R911 Solitary pulmonary nodule: Secondary | ICD-10-CM | POA: Insufficient documentation

## 2020-01-15 DIAGNOSIS — Z809 Family history of malignant neoplasm, unspecified: Secondary | ICD-10-CM | POA: Insufficient documentation

## 2020-01-15 DIAGNOSIS — K760 Fatty (change of) liver, not elsewhere classified: Secondary | ICD-10-CM | POA: Insufficient documentation

## 2020-01-15 DIAGNOSIS — E785 Hyperlipidemia, unspecified: Secondary | ICD-10-CM | POA: Insufficient documentation

## 2020-01-15 NOTE — Assessment & Plan Note (Signed)
Positive family history for sister with uterine malignancy in her 47s, 1 maternal uncle with brain cancer, another maternal uncle with renal cancer.  Mother with lung cancer and breast cancer.  For your family Hx of cancer-to  genetic counselor for Lynch syndrome discussion.

## 2020-01-15 NOTE — Assessment & Plan Note (Addendum)
Continue with all weight loss and exercise goals. We have added Crestor 10 mg for high cholesterol and to help protect you from MI and stroke in the future.  Labs in 2 mos and office visit to follow.  Lab Results  Component Value Date   CHOL 161 11/13/2019   HDL 31.30 (L) 11/13/2019   LDLDIRECT 85.0 11/13/2019   TRIG 279.0 (H) 11/13/2019   CHOLHDL 5 11/13/2019

## 2020-01-15 NOTE — Assessment & Plan Note (Addendum)
Begin Crestor, weight loss recommended nonalcohol drinker.  We will follow up ultrasound.

## 2020-01-15 NOTE — Assessment & Plan Note (Signed)
Doing much better.  She had right upper quadrant pain after eating a lot of cherries.  She has been taking Carafate and Protonix.  No further upper GI complaints.  She may cut back on the Carafate and and use as needed.

## 2020-01-15 NOTE — Assessment & Plan Note (Signed)
For your concern about pulmonary  Nodule- Referral to Lung specialist.  Reassurance provided.

## 2020-01-15 NOTE — Assessment & Plan Note (Signed)
Having right leg pain, taking Lyrica 50 mg 3 times a day, followed by the pain clinic.  Lyrica seems to help, but it wears off at the end of the day.  Patient to continue to follow-up with pain clinic.

## 2020-01-18 ENCOUNTER — Other Ambulatory Visit: Payer: Self-pay | Admitting: Student in an Organized Health Care Education/Training Program

## 2020-01-22 ENCOUNTER — Inpatient Hospital Stay: Payer: Managed Care, Other (non HMO) | Attending: Oncology | Admitting: Licensed Clinical Social Worker

## 2020-01-22 ENCOUNTER — Inpatient Hospital Stay: Payer: Managed Care, Other (non HMO)

## 2020-01-26 ENCOUNTER — Encounter: Payer: Self-pay | Admitting: Student in an Organized Health Care Education/Training Program

## 2020-01-30 ENCOUNTER — Telehealth: Payer: Self-pay | Admitting: Nurse Practitioner

## 2020-01-30 NOTE — Telephone Encounter (Signed)
Rejection Reason - Patient was No Show - patient no showed for genetics appt" Gillespie said 28 minutes ago

## 2020-02-16 ENCOUNTER — Ambulatory Visit (HOSPITAL_BASED_OUTPATIENT_CLINIC_OR_DEPARTMENT_OTHER): Payer: Managed Care, Other (non HMO) | Admitting: Student in an Organized Health Care Education/Training Program

## 2020-02-16 ENCOUNTER — Other Ambulatory Visit: Payer: Self-pay

## 2020-02-16 ENCOUNTER — Other Ambulatory Visit: Payer: Self-pay | Admitting: Student in an Organized Health Care Education/Training Program

## 2020-02-16 ENCOUNTER — Encounter: Payer: Self-pay | Admitting: Student in an Organized Health Care Education/Training Program

## 2020-02-16 ENCOUNTER — Ambulatory Visit
Admission: RE | Admit: 2020-02-16 | Discharge: 2020-02-16 | Disposition: A | Discharge status: Home / Self Care | Payer: Managed Care, Other (non HMO) | Source: Ambulatory Visit | Attending: Student in an Organized Health Care Education/Training Program | Admitting: Student in an Organized Health Care Education/Training Program

## 2020-02-16 VITALS — BP 164/101 | HR 75 | Temp 97.3°F | Resp 18 | Ht 62.0 in | Wt 193.0 lb

## 2020-02-16 DIAGNOSIS — Z9889 Other specified postprocedural states: Secondary | ICD-10-CM | POA: Diagnosis present

## 2020-02-16 DIAGNOSIS — G894 Chronic pain syndrome: Secondary | ICD-10-CM

## 2020-02-16 DIAGNOSIS — G8929 Other chronic pain: Secondary | ICD-10-CM | POA: Insufficient documentation

## 2020-02-16 DIAGNOSIS — M5416 Radiculopathy, lumbar region: Secondary | ICD-10-CM | POA: Insufficient documentation

## 2020-02-16 DIAGNOSIS — R52 Pain, unspecified: Secondary | ICD-10-CM

## 2020-02-16 MED ORDER — LIDOCAINE HCL 2 % IJ SOLN
20.0000 mL | Freq: Once | INTRAMUSCULAR | Status: AC
  Administered 2020-02-16: 400 mg

## 2020-02-16 MED ORDER — LIDOCAINE HCL (PF) 2 % IJ SOLN
INTRAMUSCULAR | Status: AC
  Filled 2020-02-16: qty 5

## 2020-02-16 MED ORDER — LIDOCAINE HCL 2 % IJ SOLN
INTRAMUSCULAR | Status: AC
  Filled 2020-02-16: qty 10

## 2020-02-16 MED ORDER — IOHEXOL 180 MG/ML  SOLN
INTRAMUSCULAR | Status: AC
  Filled 2020-02-16: qty 20

## 2020-02-16 MED ORDER — DEXAMETHASONE SODIUM PHOSPHATE 10 MG/ML IJ SOLN
INTRAMUSCULAR | Status: AC
  Filled 2020-02-16: qty 1

## 2020-02-16 MED ORDER — SODIUM CHLORIDE (PF) 0.9 % IJ SOLN
INTRAMUSCULAR | Status: AC
  Filled 2020-02-16: qty 10

## 2020-02-16 MED ORDER — ROPIVACAINE HCL 2 MG/ML IJ SOLN
INTRAMUSCULAR | Status: AC
  Filled 2020-02-16: qty 10

## 2020-02-16 MED ORDER — ROPIVACAINE HCL 2 MG/ML IJ SOLN
3.0000 mL | Freq: Once | INTRAMUSCULAR | Status: AC
  Administered 2020-02-16: 3 mL via EPIDURAL

## 2020-02-16 MED ORDER — SODIUM CHLORIDE 0.9% FLUSH
4.0000 mL | Freq: Once | INTRAVENOUS | Status: AC
  Administered 2020-02-16: 4 mL

## 2020-02-16 MED ORDER — TRIAMCINOLONE ACETONIDE 40 MG/ML IJ SUSP
40.0000 mg | Freq: Once | INTRAMUSCULAR | Status: AC
  Administered 2020-02-16: 40 mg

## 2020-02-16 MED ORDER — IOHEXOL 180 MG/ML  SOLN
10.0000 mL | Freq: Once | INTRAMUSCULAR | Status: AC
  Administered 2020-02-16: 10 mL via EPIDURAL

## 2020-02-16 NOTE — Progress Notes (Signed)
Safety precautions to be maintained throughout the outpatient stay will include: orient to surroundings, keep bed in low position, maintain call bell within reach at all times, provide assistance with transfer out of bed and ambulation.  

## 2020-02-16 NOTE — Progress Notes (Signed)
Patient's Name: Sherry Hobbs  MRN: [id number]  Referring Provider: Marval Regal, NP  DOB: 11-May-1971  PCP: Marval Regal, NP  DOS: 02/16/2020  Note by: Gillis Santa, MD  Service setting: Ambulatory outpatient  Specialty: Interventional Pain Management  Patient type: Established  Location: [facility] (AMB) Pain Management Facility  Visit type: Interventional Procedure   Primary Reason for Visit: Interventional Pain Management Treatment. CC: Hip Pain (left)  Procedure:          Anesthesia, Analgesia, Anxiolysis:  Type: Therapeutic Inter-Laminar Epidural Steroid Injection  #2  Region: Lumbar Level: L5-S1 Level. Laterality: Left-Sided         Type: Local Anesthesia  Local Anesthetic: Lidocaine 1-2%  Position: Prone with head of the table was raised to facilitate breathing.   Indications: 1. Chronic radicular lumbar pain   2. Lumbar radiculopathy   3. History of lumbar surgery   4. Chronic pain syndrome    History of L4-L5 laminectomy for synovial cyst now having left leg pain related to left L4-L5 radiculopathy.  Avoid L4-L5 due to laminectomy.  Pain Score: Pre-procedure: 4 /10 Post-procedure: 0-No pain/10    Pre-op Assessment:  Sherry Hobbs is a 49 y.o. (year old), female patient, seen today for interventional treatment. She  has a past surgical history that includes Dilation and curettage of uterus (05/03/2016); Dilatation & currettage/hysteroscopy with novasure ablation (N/A, 12/03/2017); Lumbar laminectomy/decompression microdiscectomy (Left, 06/24/2018); Back surgery; Laparoscopic assisted vaginal hysterectomy (N/A, 07/07/2019); Laparoscopic bilateral salpingectomy (Bilateral, 07/07/2019); and Abdominal hysterectomy. Sherry Hobbs has a current medication list which includes the following prescription(s): cholecalciferol, cyanocobalamin, docusate sodium, hydrocortisone, ibuprofen, ketoconazole, lisinopril, metformin, methocarbamol, pantoprazole, pregabalin, rosuvastatin, triamcinolone  cream, ferrous sulfate, and sucralfate. Her primarily concern today is the Hip Pain (left)  Initial Vital Signs:  Pulse/HCG Rate: 78  Temp: (!) 97.3 F (36.3 C) Resp: 15 BP: (!) 166/89 SpO2: 98 %  BMI: Estimated body mass index is 35.3 kg/m as calculated from the following:   Height as of this encounter: 5\' 2"  (1.575 m).   Weight as of this encounter: 193 lb (87.5 kg).  Risk Assessment: Allergies: Reviewed. She is allergic to codeine.  Allergy Precautions: None required Coagulopathies: Reviewed. None identified.  Blood-thinner therapy: None at this time Active Infection(s): Reviewed. None identified. Sherry Hobbs is afebrile  Site Confirmation: Sherry Hobbs was asked to confirm the procedure and laterality before marking the site Procedure checklist: Completed Consent: Before the procedure and under the influence of no sedative(s), amnesic(s), or anxiolytics, the patient was informed of the treatment options, risks and possible complications. To fulfill our ethical and legal obligations, as recommended by the American Medical Association's Code of Ethics, I have informed the patient of my clinical impression; the nature and purpose of the treatment or procedure; the risks, benefits, and possible complications of the intervention; the alternatives, including doing nothing; the risk(s) and benefit(s) of the alternative treatment(s) or procedure(s); and the risk(s) and benefit(s) of doing nothing. The patient was provided information about the general risks and possible complications associated with the procedure. These may include, but are not limited to: failure to achieve desired goals, infection, bleeding, organ or nerve damage, allergic reactions, paralysis, and death. In addition, the patient was informed of those risks and complications associated to Spine-related procedures, such as failure to decrease pain; infection (i.e.: Meningitis, epidural or intraspinal abscess); bleeding (i.e.:  epidural hematoma, subarachnoid hemorrhage, or any other type of intraspinal or peri-dural bleeding); organ or nerve damage (i.e.: Any type of peripheral  nerve, nerve root, or spinal cord injury) with subsequent damage to sensory, motor, and/or autonomic systems, resulting in permanent pain, numbness, and/or weakness of one or several areas of the body; allergic reactions; (i.e.: anaphylactic reaction); and/or death. Furthermore, the patient was informed of those risks and complications associated with the medications. These include, but are not limited to: allergic reactions (i.e.: anaphylactic or anaphylactoid reaction(s)); adrenal axis suppression; blood sugar elevation that in diabetics may result in ketoacidosis or comma; water retention that in patients with history of congestive heart failure may result in shortness of breath, pulmonary edema, and decompensation with resultant heart failure; weight gain; swelling or edema; medication-induced neural toxicity; particulate matter embolism and blood vessel occlusion with resultant organ, and/or nervous system infarction; and/or aseptic necrosis of one or more joints. Finally, the patient was informed that Medicine is not an exact science; therefore, there is also the possibility of unforeseen or unpredictable risks and/or possible complications that may result in a catastrophic outcome. The patient indicated having understood very clearly. We have given the patient no guarantees and we have made no promises. Enough time was given to the patient to ask questions, all of which were answered to the patient's satisfaction. Sherry Hobbs has indicated that she wanted to continue with the procedure. Attestation: I, the ordering provider, attest that I have discussed with the patient the benefits, risks, side-effects, alternatives, likelihood of achieving goals, and potential problems during recovery for the procedure that I have provided informed consent. Date  Time:  02/16/2020  8:37 AM  Pre-Procedure Preparation:  Monitoring: As per clinic protocol. Respiration, ETCO2, SpO2, BP, heart rate and rhythm monitor placed and checked for adequate function Safety Precautions: Patient was assessed for positional comfort and pressure points before starting the procedure. Time-out: I initiated and conducted the "Time-out" before starting the procedure, as per protocol. The patient was asked to participate by confirming the accuracy of the "Time Out" information. Verification of the correct person, site, and procedure were performed and confirmed by me, the nursing staff, and the patient. "Time-out" conducted as per Joint Commission's Universal Protocol (UP.01.01.01). Time: 0929  Description of Procedure:          Target Area: The interlaminar space, initially targeting the lower laminar border of the superior vertebral body. Approach: Paramedial approach. Area Prepped: Entire Posterior Lumbar Region Prepping solution: DuraPrep (Iodine Povacrylex [0.7% available iodine] and Isopropyl Alcohol, 74% w/w) Safety Precautions: Aspiration looking for blood return was conducted prior to all injections. At no point did we inject any substances, as a needle was being advanced. No attempts were made at seeking any paresthesias. Safe injection practices and needle disposal techniques used. Medications properly checked for expiration dates. SDV (single dose vial) medications used. Description of the Procedure: Protocol guidelines were followed. The procedure needle was introduced through the skin, ipsilateral to the reported pain, and advanced to the target area. Bone was contacted and the needle walked caudad, until the lamina was cleared. The epidural space was identified using "loss-of-resistance technique" with 2-3 ml of PF-NaCl (0.9% NSS), in a 5cc LOR glass syringe.  Vitals:   02/16/20 0853 02/16/20 0925 02/16/20 0930 02/16/20 0940  BP: (!) 166/89 (!) 159/101 (!) 171/105 (!)  164/101  Pulse: 78 70 73 75  Resp:  15 20 18   Temp: (!) 97.3 F (36.3 C)     SpO2: 98% 98% 97% 98%  Weight: 193 lb (87.5 kg)     Height: 5\' 2"  (1.575 m)  Start Time: 0929 hrs. End Time: 0934 hrs.  Materials:  Needle(s) Type: Epidural needle Gauge: 22G Length: 3.5-in Medication(s): Please see orders for medications and dosing details. 8 cc solution made of 5 cc of preservative-free saline, 2 cc of 0.2% ropivacaine, 1 cc of Decadron 10 mg/cc.  Imaging Guidance (Spinal):          Type of Imaging Technique: Fluoroscopy Guidance (Spinal) Indication(s): Assistance in needle guidance and placement for procedures requiring needle placement in or near specific anatomical locations not easily accessible without such assistance. Exposure Time: Please see nurses notes. Contrast: Before injecting any contrast, we confirmed that the patient did not have an allergy to iodine, shellfish, or radiological contrast. Once satisfactory needle placement was completed at the desired level, radiological contrast was injected. Contrast injected under live fluoroscopy. No contrast complications. See chart for type and volume of contrast used. Fluoroscopic Guidance: I was personally present during the use of fluoroscopy. "Tunnel Vision Technique" used to obtain the best possible view of the target area. Parallax error corrected before commencing the procedure. "Direction-depth-direction" technique used to introduce the needle under continuous pulsed fluoroscopy. Once target was reached, antero-posterior, oblique, and lateral fluoroscopic projection used confirm needle placement in all planes. Images permanently stored in EMR. Interpretation: I personally interpreted the imaging intraoperatively. Adequate needle placement confirmed in multiple planes. Appropriate spread of contrast into desired area was observed. No evidence of afferent or efferent intravascular uptake. No intrathecal or subarachnoid spread  observed. Permanent images saved into the patient's record.  Antibiotic Prophylaxis:   Anti-infectives (From admission, onward)   None     Indication(s): None identified  Post-operative Assessment:  Post-procedure Vital Signs:  Pulse/HCG Rate: 75  Temp: (!) 97.3 F (36.3 C) Resp: 18 BP: (!) 164/101 SpO2: 98 %  EBL: None  Complications: No immediate post-treatment complications observed by team, or reported by patient.  Note: The patient tolerated the entire procedure well. A repeat set of vitals were taken after the procedure and the patient was kept under observation following institutional policy, for this type of procedure. Post-procedural neurological assessment was performed, showing return to baseline, prior to discharge. The patient was provided with post-procedure discharge instructions, including a section on how to identify potential problems. Should any problems arise concerning this procedure, the patient was given instructions to immediately contact us, at any time, without hesitation. In any case, we plan to contact the patient by telephone for a follow-up status report regarding this interventional procedure.  Comments:  No additional relevant information.  Plan of Care  Orders:  No orders of the defined types were placed in this encounter.  Medications ordered for procedure: Meds ordered this encounter  Medications  . lidocaine (XYLOCAINE) 2 % (with pres) injection 400 mg  . ropivacaine (PF) 2 mg/mL (0.2%) (NAROPIN) injection 3 mL  . sodium chloride flush (NS) 0.9 % injection 4 mL  . triamcinolone acetonide (KENALOG-40) injection 40 mg  . iohexol (OMNIPAQUE) 180 MG/ML injection 10 mL    Must be Myelogram-compatible. If not available, you may substitute with a water-soluble, non-ionic, hypoallergenic, myelogram-compatible radiological contrast medium.   Medications administered: We administered lidocaine, ropivacaine (PF) 2 mg/mL (0.2%), sodium chloride flush,  triamcinolone acetonide, and iohexol.  See the medical record for exact dosing, route, and time of administration.  Follow-up plan:   Return in about 4 weeks (around 03/15/2020) for Post Procedure Evaluation, virtual.      History of L4-5 laminectomy with Dr. Lacinda Axon due to synovial cyst now  having left L4-L5 radicular pain status post L5-S1 ESI on the left on 03/05/2019, 02/16/20   Recent Visits Date Type Provider Dept  12/18/19 Office Visit Gillis Santa, MD [facility]  Showing recent visits within past 90 days and meeting all other requirements Today's Visits Date Type Provider Dept  02/16/20 Procedure visit Gillis Santa, MD [facility]  Showing today's visits and meeting all other requirements Future Appointments Date Type Provider Dept  03/15/20 Appointment Gillis Santa, MD [facility]  Showing future appointments within next 90 days and meeting all other requirements  Disposition: Discharge home  Discharge Date & Time: 02/16/2020; 0940 hrs.   Primary Care Physician: Marval Regal, NP Location: [facility] Note by: Gillis Santa, MD Date: 02/16/2020; Time: 10:38 AM  Disclaimer:  Medicine is not an exact science. The only guarantee in medicine is that nothing is guaranteed. It is important to note that the decision to proceed with this intervention was based on the information collected from the patient. The Data and conclusions were drawn from the patient's questionnaire, the interview, and the physical examination. Because the information was provided in large part by the patient, it cannot be guaranteed that it has not been purposely or unconsciously manipulated. Every effort has been made to obtain as much relevant data as possible for this evaluation. It is important to note that the conclusions that lead to this procedure are derived in large part from the available data. Always take into account that the treatment will  also be dependent on availability of resources and existing treatment guidelines, considered by other Pain Management Practitioners as being common knowledge and practice, at the time of the intervention. For Medico-Legal purposes, it is also important to point out that variation in procedural techniques and pharmacological choices are the acceptable norm. The indications, contraindications, technique, and results of the above procedure should only be interpreted and judged by a Board-Certified Interventional Pain Specialist with extensive familiarity and expertise in the same exact procedure and technique.

## 2020-02-17 ENCOUNTER — Telehealth: Payer: Self-pay

## 2020-02-17 NOTE — Telephone Encounter (Signed)
Post procedure phone call.  Patient states she is doing well.  

## 2020-02-23 ENCOUNTER — Other Ambulatory Visit: Payer: Self-pay | Admitting: Nurse Practitioner

## 2020-03-11 ENCOUNTER — Encounter: Payer: Self-pay | Admitting: Student in an Organized Health Care Education/Training Program

## 2020-03-15 ENCOUNTER — Other Ambulatory Visit: Payer: Self-pay

## 2020-03-15 ENCOUNTER — Encounter: Payer: Self-pay | Admitting: Student in an Organized Health Care Education/Training Program

## 2020-03-15 ENCOUNTER — Other Ambulatory Visit (INDEPENDENT_AMBULATORY_CARE_PROVIDER_SITE_OTHER): Payer: Managed Care, Other (non HMO)

## 2020-03-15 ENCOUNTER — Ambulatory Visit
Payer: Managed Care, Other (non HMO) | Attending: Student in an Organized Health Care Education/Training Program | Admitting: Student in an Organized Health Care Education/Training Program

## 2020-03-15 DIAGNOSIS — Z9889 Other specified postprocedural states: Secondary | ICD-10-CM

## 2020-03-15 DIAGNOSIS — K760 Fatty (change of) liver, not elsewhere classified: Secondary | ICD-10-CM | POA: Diagnosis not present

## 2020-03-15 DIAGNOSIS — G894 Chronic pain syndrome: Secondary | ICD-10-CM | POA: Diagnosis not present

## 2020-03-15 DIAGNOSIS — M47816 Spondylosis without myelopathy or radiculopathy, lumbar region: Secondary | ICD-10-CM | POA: Diagnosis not present

## 2020-03-15 DIAGNOSIS — E785 Hyperlipidemia, unspecified: Secondary | ICD-10-CM | POA: Diagnosis not present

## 2020-03-15 DIAGNOSIS — M5416 Radiculopathy, lumbar region: Secondary | ICD-10-CM | POA: Diagnosis not present

## 2020-03-15 DIAGNOSIS — G8929 Other chronic pain: Secondary | ICD-10-CM

## 2020-03-15 LAB — COMPREHENSIVE METABOLIC PANEL
ALT: 27 U/L (ref 0–35)
AST: 22 U/L (ref 0–37)
Albumin: 3.9 g/dL (ref 3.5–5.2)
Alkaline Phosphatase: 94 U/L (ref 39–117)
BUN: 10 mg/dL (ref 6–23)
CO2: 27 mEq/L (ref 19–32)
Calcium: 9.4 mg/dL (ref 8.4–10.5)
Chloride: 103 mEq/L (ref 96–112)
Creatinine, Ser: 1.02 mg/dL (ref 0.40–1.20)
GFR: 65.79 mL/min (ref >60.00)
Glucose, Bld: 142 mg/dL — ABNORMAL HIGH (ref 70–99)
Potassium: 4.2 mEq/L (ref 3.5–5.1)
Sodium: 140 mEq/L (ref 135–145)
Total Bilirubin: 0.6 mg/dL (ref 0.2–1.2)
Total Protein: 6.6 g/dL (ref 6.0–8.3)

## 2020-03-15 LAB — LIPID PANEL
Cholesterol: 89 mg/dL (ref 0–200)
HDL: 23.1 mg/dL — ABNORMAL LOW (ref >39.00)
NonHDL: 66.07
Total CHOL/HDL Ratio: 4
Triglycerides: 208 mg/dL — ABNORMAL HIGH (ref 0.0–149.0)
VLDL: 41.6 mg/dL — ABNORMAL HIGH (ref 0.0–40.0)

## 2020-03-15 LAB — LDL CHOLESTEROL, DIRECT: Direct LDL: 37 mg/dL

## 2020-03-15 NOTE — Progress Notes (Signed)
Patient: Sherry Hobbs  Service Category: E/M  Provider: Gillis Santa, MD  DOB: 06-26-1970  DOS: 03/15/2020  Location: Office  MRN: [id number]  Setting: Ambulatory outpatient  Referring Provider: Marval Regal, NP  Type: Established Patient  Specialty: Interventional Pain Management  PCP: Marval Regal, NP  Location: Home  Delivery: TeleHealth     Virtual Encounter - Pain Management PROVIDER NOTE: Information contained herein reflects review and annotations entered in association with encounter. Interpretation of such information and data should be left to medically-trained personnel. Information provided to patient can be located elsewhere in the medical record under "Patient Instructions". Document created using STT-dictation technology, any transcriptional errors that may result from process are unintentional.    Contact & Pharmacy Preferred: [phone] Home: [phone] (home) Mobile: [phone] (mobile) E-mail: [email or web address]  [id number] [id number]: [phone]: [address]Phone: [phone]: [phone]  Pre-screening  Sherry Hobbs offered "in-person" vs "virtual" encounter. She indicated preferring virtual for this encounter.   Reason COVID-19*  Social distancing based on CDC and AMA recommendations.   I contacted Sherry Moccasinon 03/15/2020 via video conference.      I clearly identified myself as BGillis Santa MD. I verified that I was speaking with the correct person using two identifiers (Name: Sherry Hobbs and date of birth: [birth year].  Consent I sought verbal advanced consent from Sherry Moccasinfor virtual visit interactions. I informed Sherry Hobbs of possible security and privacy concerns, risks, and limitations associated with providing  "not-in-person" medical evaluation and management services. I also informed Sherry Hobbs of the availability of "in-person" appointments. Finally, I informed her that there would be a charge for the virtual visit and that she could be  personally, fully or partially, financially responsible for it. Sherry Hobbs understanding and agreed to proceed.   Historic Elements   Ms. CPAOLINA KARWOWSKIis a 49y.o. year old, female patient evaluated today after our last contact on 02/16/2020. Ms. BBumgardner has a past medical history of Anemia, Arthritis, Depression, Diabetes mellitus without complication ([place], DM (diabetes mellitus) ([place] (08/07/2019), Dysuria (12/01/2019), Elevated serum creatinine, GERD (gastroesophageal reflux disease), Headache, History of blood transfusion (05/02/2016), HTN (hypertension) (08/07/2019), Hypertension, and Neuropathy. She also  has a past surgical history that includes Dilation and curettage of uterus (05/03/2016); Dilatation & currettage/hysteroscopy with novasure ablation (N/A, 12/03/2017); Lumbar laminectomy/decompression microdiscectomy (Left, 06/24/2018); Back surgery; Laparoscopic assisted vaginal hysterectomy (N/A, 07/07/2019); Laparoscopic bilateral salpingectomy (Bilateral, 07/07/2019); and Abdominal hysterectomy. Sherry Hobbs a current medication list which includes the following prescription(s): cholecalciferol, cyanocobalamin, docusate sodium, ferrous sulfate, hydrocortisone, ibuprofen, ketoconazole, lisinopril, metformin, methocarbamol, pantoprazole, pregabalin, rosuvastatin, sucralfate, and triamcinolone cream. She  reports that she has never smoked. She has never used smokeless tobacco. She reports current alcohol use. She reports that she does not use drugs. Ms. BDolbowis allergic to codeine.   HPI  Today, she is being contacted for a post-procedure assessment.   Post-Procedure Evaluation  Procedure (02/16/2020):  Type: Therapeutic Inter-Laminar Epidural Steroid  Injection  #2  Region: Lumbar Level: L5-S1 Level. Laterality: Left-Sided   Sedation: Please see nurses note.  Effectiveness during initial hour after procedure(Ultra-Short Term Relief): 80 %   Local anesthetic used: Long-acting (4-6 hours) Effectiveness: Defined as any analgesic benefit obtained secondary to the administration of  local anesthetics. This carries significant diagnostic value as to the etiological location, or anatomical origin, of the pain. Duration of benefit is expected to coincide with the duration of the local anesthetic used.  Effectiveness during initial 4-6 hours after procedure(Short-Term Relief): 80 %   Long-term benefit: Defined as any relief past the pharmacologic duration of the local anesthetics.  Effectiveness past the initial 6 hours after procedure(Long-Term Relief): 95 % (only issue as of now is right across lower back/upper buttocks.  when she is lifting or carrying something or standing for a long time there is still pressure.)   Current benefits: Defined as benefit that persist at this time.   Analgesia:  >75% relief Function: Sherry Hobbs reports improvement in function ROM: Sherry Hobbs reports improvement in ROM   Laboratory Chemistry Profile   Renal Lab Results  Component Value Date   BUN 10 03/15/2020   CREATININE 1.02 03/15/2020   GFR 65.79 03/15/2020   GFRAA >60 11/21/2019   GFRNONAA >60 11/21/2019     Hepatic Lab Results  Component Value Date   AST 22 03/15/2020   ALT 27 03/15/2020   ALBUMIN 3.9 03/15/2020   ALKPHOS 94 03/15/2020   HCVAB <0.1 03/30/2016   LIPASE 34 11/21/2019     Electrolytes Lab Results  Component Value Date   NA 140 03/15/2020   K 4.2 03/15/2020   CL 103 03/15/2020   CALCIUM 9.4 03/15/2020     Bone No results found for: VD25OH, VD125OH2TOT, LN7972QA0, UO1561BP7, 25OHVITD1, 25OHVITD2, 25OHVITD3, TESTOFREE, TESTOSTERONE   Inflammation (CRP: Acute Phase) (ESR: Chronic Phase) No results found for: CRP,  ESRSEDRATE, LATICACIDVEN     Note: Above Lab results reviewed.   Assessment  The primary encounter diagnosis was Chronic radicular lumbar pain. Diagnoses of Lumbar radiculopathy, History of lumbar surgery, Lumbar facet arthropathy, and Chronic pain syndrome were also pertinent to this visit.  Plan of Care  Ms. EDITA WEYENBERG has a current medication list which includes the following long-term medication(s): lisinopril, metformin, pantoprazole, rosuvastatin, and sucralfate.  Sherry Hobbs states that she is doing well in regards to her left leg pain after her left L5-S1 epidural steroid injection performed on 02/16/2020.  She finds it easier to walk and bear weight.  She is having more pain localized in her low back.  She states that it is in her low back and upper buttock region around her waistline.  It is worse with lumbar extension and patient does find pain relief with forward flexion.  I reviewed the patient's lumbar MRI which does show lumbar facet hypertrophy and arthropathic changes along her facet joints at L3-L4, L4-L5 and L5-S1.  Discussed diagnostic lumbar facet medial branch nerve blocks with patient for her axial low back pain related to lumbar facet arthropathy is confirmed on physical exam and MRI findings.  Risks and benefits reviewed and patient would like to proceed.   Orders:  Orders Placed This Encounter  Procedures  . LUMBAR FACET(MEDIAL BRANCH NERVE BLOCK) MBNB    Standing Status:   Future    Standing Expiration Date:   04/14/2020    Scheduling Instructions:     Procedure: Lumbar facet block (AKA.: Lumbosacral medial branch nerve block)     Side: Bilateral     Level: L4-5, & L5-S1 Facets (L3, L4, L5, Medial Branch Nerves)     Sedation: with     Timeframe: ASAA    Order Specific Question:   Where will this procedure be performed?    Answer:  [facility]   Follow-up plan:   Return in about 1 week (around 03/22/2020) for B/L L3, 4, 5 Fcts , with sedation.      History of L4-5 laminectomy with Dr. Lacinda Axon due to synovial cyst now having left L4-L5 radicular pain status post L5-S1 ESI on the left on 03/05/2019, 02/16/20    Recent Visits Date Type Provider Dept  02/16/20 Procedure visit Gillis Santa, MD [facility]  12/18/19 Office Visit Gillis Santa, MD [facility]  Showing recent visits within past 90 days and meeting all other requirements Today's Visits Date Type Provider Dept  03/15/20 Telemedicine Gillis Santa, MD [facility]  Showing today's visits and meeting all other requirements Future Appointments No visits were found meeting these conditions. Showing future appointments within next 90 days and meeting all other requirements  I discussed the assessment and treatment plan with the patient. The patient was provided an opportunity to ask questions and all were answered. The patient agreed with the plan and demonstrated an understanding of the instructions.  Patient advised to call back or seek an in-person evaluation if the symptoms or condition worsens.  Duration of encounter: 20 minutes.  Note by: Gillis Santa, MD Date: 03/15/2020; Time: 1:37 PM

## 2020-03-17 ENCOUNTER — Telehealth: Payer: Self-pay | Admitting: Nurse Practitioner

## 2020-03-17 NOTE — Telephone Encounter (Signed)
Spoke with patient about lab results.

## 2020-03-17 NOTE — Telephone Encounter (Signed)
Patient was returning call for results 

## 2020-03-25 ENCOUNTER — Ambulatory Visit (INDEPENDENT_AMBULATORY_CARE_PROVIDER_SITE_OTHER): Payer: Managed Care, Other (non HMO) | Admitting: Pulmonary Disease

## 2020-03-25 ENCOUNTER — Other Ambulatory Visit: Payer: Self-pay

## 2020-03-25 ENCOUNTER — Encounter: Payer: Self-pay | Admitting: Pulmonary Disease

## 2020-03-25 VITALS — BP 120/72 | HR 72 | Ht 62.0 in | Wt 190.2 lb

## 2020-03-25 DIAGNOSIS — R0600 Dyspnea, unspecified: Secondary | ICD-10-CM | POA: Diagnosis not present

## 2020-03-25 DIAGNOSIS — R911 Solitary pulmonary nodule: Secondary | ICD-10-CM

## 2020-03-25 DIAGNOSIS — IMO0001 Reserved for inherently not codable concepts without codable children: Secondary | ICD-10-CM

## 2020-03-25 DIAGNOSIS — R0609 Other forms of dyspnea: Secondary | ICD-10-CM

## 2020-03-25 NOTE — Patient Instructions (Signed)
We will order a CT for follow-up in August of this coming year.  We will see you in follow-up after that.  Please do let us know if you develop any issues with worsening of your breathing etc.

## 2020-03-29 ENCOUNTER — Ambulatory Visit: Payer: Managed Care, Other (non HMO) | Admitting: Nurse Practitioner

## 2020-03-29 DIAGNOSIS — Z0289 Encounter for other administrative examinations: Secondary | ICD-10-CM

## 2020-03-31 ENCOUNTER — Ambulatory Visit
Payer: Managed Care, Other (non HMO) | Attending: Student in an Organized Health Care Education/Training Program | Admitting: Student in an Organized Health Care Education/Training Program

## 2020-05-17 LAB — HM DIABETES EYE EXAM

## 2020-05-26 ENCOUNTER — Encounter: Payer: Self-pay | Admitting: Student in an Organized Health Care Education/Training Program

## 2020-06-02 ENCOUNTER — Encounter: Payer: Self-pay | Admitting: Student in an Organized Health Care Education/Training Program

## 2020-06-02 ENCOUNTER — Ambulatory Visit (HOSPITAL_BASED_OUTPATIENT_CLINIC_OR_DEPARTMENT_OTHER): Payer: Managed Care, Other (non HMO) | Admitting: Student in an Organized Health Care Education/Training Program

## 2020-06-02 ENCOUNTER — Other Ambulatory Visit: Payer: Self-pay

## 2020-06-02 ENCOUNTER — Ambulatory Visit
Admission: RE | Admit: 2020-06-02 | Discharge: 2020-06-02 | Disposition: A | Discharge status: Home / Self Care | Payer: Managed Care, Other (non HMO) | Source: Ambulatory Visit | Attending: Student in an Organized Health Care Education/Training Program | Admitting: Student in an Organized Health Care Education/Training Program

## 2020-06-02 VITALS — BP 151/76 | HR 76 | Temp 97.8°F | Resp 17 | Ht 62.0 in | Wt 193.0 lb

## 2020-06-02 DIAGNOSIS — M47816 Spondylosis without myelopathy or radiculopathy, lumbar region: Secondary | ICD-10-CM

## 2020-06-02 DIAGNOSIS — Z9889 Other specified postprocedural states: Secondary | ICD-10-CM | POA: Diagnosis present

## 2020-06-02 DIAGNOSIS — G894 Chronic pain syndrome: Secondary | ICD-10-CM | POA: Diagnosis not present

## 2020-06-02 MED ORDER — FENTANYL CITRATE (PF) 100 MCG/2ML IJ SOLN
INTRAMUSCULAR | Status: AC
  Filled 2020-06-02: qty 2

## 2020-06-02 MED ORDER — DEXAMETHASONE SODIUM PHOSPHATE 10 MG/ML IJ SOLN
INTRAMUSCULAR | Status: AC
  Filled 2020-06-02: qty 2

## 2020-06-02 MED ORDER — DEXAMETHASONE SODIUM PHOSPHATE 10 MG/ML IJ SOLN
10.0000 mg | Freq: Once | INTRAMUSCULAR | Status: AC
  Administered 2020-06-02: 10 mg

## 2020-06-02 MED ORDER — DEXAMETHASONE SODIUM PHOSPHATE 10 MG/ML IJ SOLN
20.0000 mg | Freq: Once | INTRAMUSCULAR | Status: AC
  Administered 2020-06-02: 10 mg

## 2020-06-02 MED ORDER — ROPIVACAINE HCL 2 MG/ML IJ SOLN
INTRAMUSCULAR | Status: AC
  Filled 2020-06-02: qty 20

## 2020-06-02 MED ORDER — ROPIVACAINE HCL 2 MG/ML IJ SOLN
9.0000 mL | Freq: Once | INTRAMUSCULAR | Status: AC
  Administered 2020-06-02: 10 mL via PERINEURAL

## 2020-06-02 MED ORDER — LIDOCAINE HCL 2 % IJ SOLN
20.0000 mL | Freq: Once | INTRAMUSCULAR | Status: AC
  Administered 2020-06-02: 100 mg
  Filled 2020-06-02: qty 20

## 2020-06-02 MED ORDER — LIDOCAINE HCL (PF) 2 % IJ SOLN
INTRAMUSCULAR | Status: AC
  Filled 2020-06-02: qty 5

## 2020-06-02 MED ORDER — FENTANYL CITRATE (PF) 100 MCG/2ML IJ SOLN
25.0000 ug | INTRAMUSCULAR | Status: DC | PRN
  Administered 2020-06-02: 50 ug via INTRAVENOUS

## 2020-06-02 NOTE — Progress Notes (Signed)
Safety precautions to be maintained throughout the outpatient stay will include: orient to surroundings, keep bed in low position, maintain call bell within reach at all times, provide assistance with transfer out of bed and ambulation.  

## 2020-06-02 NOTE — Patient Instructions (Signed)

## 2020-06-02 NOTE — Progress Notes (Signed)
PROVIDER NOTE: Information contained herein reflects review and annotations entered in association with encounter. Interpretation of such information and data should be left to medically-trained personnel. Information provided to patient can be located elsewhere in the medical record under "Patient Instructions". Document created using STT-dictation technology, any transcriptional errors that may result from process are unintentional.    Patient: Sherry Hobbs  Service Category: Procedure  Provider: Gillis Santa, MD  DOB: Mar 07, 1971  DOS: 06/02/2020  Location: [facility]  MRN: [id number]  Setting: Ambulatory - outpatient  Referring Provider: Marval Regal, NP  Type: Established Patient  Specialty: Interventional Pain Management  PCP: Patient, No Pcp Per   Primary Reason for Visit: Interventional Pain Management Treatment. CC: Back Pain (low)  Procedure:          Anesthesia, Analgesia, Anxiolysis:  Type: Lumbar Facet, Medial Branch Block(s) #1  Primary Purpose: Diagnostic Region: Posterolateral Lumbosacral Spine Level: L3, L4, L5,  Medial Branch Level(s). Injecting these levels blocks the L3-4, L4-5,  lumbar facet joints. Laterality: Bilateral  Type: Moderate (Conscious) Sedation combined with Local Anesthesia Indication(s): Analgesia and Anxiety Route: Intravenous (IV) IV Access: Secured Sedation: Meaningful verbal contact was maintained at all times during the procedure  Local Anesthetic: Lidocaine 1-2%  Position: Prone   Indications: 1. Lumbar facet arthropathy   2. History of lumbar surgery   3. Chronic pain syndrome    Pain Score: Pre-procedure: 2 /10 Post-procedure: 0-No pain (standing)/10   Pre-op H&P Assessment:  Sherry Hobbs is a 50 y.o. (year old), female patient, seen today for interventional treatment. She  has a past surgical history that includes Dilation and curettage of uterus (05/03/2016); Dilatation & currettage/hysteroscopy with novasure  ablation (N/A, 12/03/2017); Lumbar laminectomy/decompression microdiscectomy (Left, 06/24/2018); Back surgery; Laparoscopic assisted vaginal hysterectomy (N/A, 07/07/2019); Laparoscopic bilateral salpingectomy (Bilateral, 07/07/2019); and Abdominal hysterectomy. Sherry Hobbs has a current medication list which includes the following prescription(s): cholecalciferol, cyanocobalamin, docusate sodium, ferrous sulfate, hydrocortisone, ibuprofen, ketoconazole, lisinopril, metformin, methocarbamol, pantoprazole, pregabalin, rosuvastatin, triamcinolone, and sucralfate, and the following Facility-Administered Medications: fentanyl. Her primarily concern today is the Back Pain (low)  Initial Vital Signs:  Pulse/HCG Rate: 80ECG Heart Rate: 77 Temp: (!) 97.4 F (36.3 C) Resp: 16 BP: (!) 155/81 SpO2: 100 %  BMI: Estimated body mass index is 35.3 kg/m as calculated from the following:   Height as of this encounter: 5\' 2"  (1.575 m).   Weight as of this encounter: 193 lb (87.5 kg).  Risk Assessment: Allergies: Reviewed. She is allergic to codeine.  Allergy Precautions: None required Coagulopathies: Reviewed. None identified.  Blood-thinner therapy: None at this time Active Infection(s): Reviewed. None identified. Sherry Hobbs is afebrile  Site Confirmation: Sherry Hobbs was asked to confirm the procedure and laterality before marking the site Procedure checklist: Completed Consent: Before the procedure and under the influence of no sedative(s), amnesic(s), or anxiolytics, the patient was informed of the treatment options, risks and possible complications. To fulfill our ethical and legal obligations, as recommended by the American Medical Association's Code of Ethics, I have informed the patient of my clinical impression; the nature and purpose of the treatment or procedure; the risks, benefits, and possible complications of the intervention; the alternatives, including doing nothing; the risk(s) and benefit(s) of  the alternative treatment(s) or procedure(s); and the risk(s) and benefit(s) of doing nothing. The patient was provided information about the general risks and possible complications associated with the procedure. These may include, but are not limited to: failure to achieve desired goals,  infection, bleeding, organ or nerve damage, allergic reactions, paralysis, and death. In addition, the patient was informed of those risks and complications associated to Spine-related procedures, such as failure to decrease pain; infection (i.e.: Meningitis, epidural or intraspinal abscess); bleeding (i.e.: epidural hematoma, subarachnoid hemorrhage, or any other type of intraspinal or peri-dural bleeding); organ or nerve damage (i.e.: Any type of peripheral nerve, nerve root, or spinal cord injury) with subsequent damage to sensory, motor, and/or autonomic systems, resulting in permanent pain, numbness, and/or weakness of one or several areas of the body; allergic reactions; (i.e.: anaphylactic reaction); and/or death. Furthermore, the patient was informed of those risks and complications associated with the medications. These include, but are not limited to: allergic reactions (i.e.: anaphylactic or anaphylactoid reaction(s)); adrenal axis suppression; blood sugar elevation that in diabetics may result in ketoacidosis or comma; water retention that in patients with history of congestive heart failure may result in shortness of breath, pulmonary edema, and decompensation with resultant heart failure; weight gain; swelling or edema; medication-induced neural toxicity; particulate matter embolism and blood vessel occlusion with resultant organ, and/or nervous system infarction; and/or aseptic necrosis of one or more joints. Finally, the patient was informed that Medicine is not an exact science; therefore, there is also the possibility of unforeseen or unpredictable risks and/or possible complications that may result in a  catastrophic outcome. The patient indicated having understood very clearly. We have given the patient no guarantees and we have made no promises. Enough time was given to the patient to ask questions, all of which were answered to the patient's satisfaction. Ms. Kincannon has indicated that she wanted to continue with the procedure. Attestation: I, the ordering provider, attest that I have discussed with the patient the benefits, risks, side-effects, alternatives, likelihood of achieving goals, and potential problems during recovery for the procedure that I have provided informed consent. Date  Time: 06/02/2020 10:57 AM  Pre-Procedure Preparation:  Monitoring: As per clinic protocol. Respiration, ETCO2, SpO2, BP, heart rate and rhythm monitor placed and checked for adequate function Safety Precautions: Patient was assessed for positional comfort and pressure points before starting the procedure. Time-out: I initiated and conducted the "Time-out" before starting the procedure, as per protocol. The patient was asked to participate by confirming the accuracy of the "Time Out" information. Verification of the correct person, site, and procedure were performed and confirmed by me, the nursing staff, and the patient. "Time-out" conducted as per Joint Commission's Universal Protocol (UP.01.01.01). Time: 1124  Description of Procedure:          Laterality: Bilateral. The procedure was performed in identical fashion on both sides. Levels:  L3, L4, L5,  Medial Branch Level(s) Area Prepped: Posterior Lumbosacral Region DuraPrep (Iodine Povacrylex [0.7% available iodine] and Isopropyl Alcohol, 74% w/w) Safety Precautions: Aspiration looking for blood return was conducted prior to all injections. At no point did we inject any substances, as a needle was being advanced. Before injecting, the patient was told to immediately notify me if she was experiencing any new onset of "ringing in the ears, or metallic taste in  the mouth". No attempts were made at seeking any paresthesias. Safe injection practices and needle disposal techniques used. Medications properly checked for expiration dates. SDV (single dose vial) medications used. After the completion of the procedure, all disposable equipment used was discarded in the proper designated medical waste containers. Local Anesthesia: Protocol guidelines were followed. The patient was positioned over the fluoroscopy table. The area was prepped in the usual manner.  The time-out was completed. The target area was identified using fluoroscopy. A 12-in long, straight, sterile hemostat was used with fluoroscopic guidance to locate the targets for each level blocked. Once located, the skin was marked with an approved surgical skin marker. Once all sites were marked, the skin (epidermis, dermis, and hypodermis), as well as deeper tissues (fat, connective tissue and muscle) were infiltrated with a small amount of a short-acting local anesthetic, loaded on a 10cc syringe with a 25G, 1.5-in  Needle. An appropriate amount of time was allowed for local anesthetics to take effect before proceeding to the next step. Local Anesthetic: Lidocaine 2.0% The unused portion of the local anesthetic was discarded in the proper designated containers. Technical explanation of process:   L3 Medial Branch Nerve Block (MBB): The target area for the L3 medial branch is at the junction of the postero-lateral aspect of the superior articular process and the superior, posterior, and medial edge of the transverse process of L4. Under fluoroscopic guidance, a Quincke needle was inserted until contact was made with os over the superior postero-lateral aspect of the pedicular shadow (target area). After negative aspiration for blood, 1.5 mL of the nerve block solution was injected without difficulty or complication. The needle was removed intact. L4 Medial Branch Nerve Block (MBB): The target area for the L4  medial branch is at the junction of the postero-lateral aspect of the superior articular process and the superior, posterior, and medial edge of the transverse process of L5. Under fluoroscopic guidance, a Quincke needle was inserted until contact was made with os over the superior postero-lateral aspect of the pedicular shadow (target area). After negative aspiration for blood, 1.5 mL of the nerve block solution was injected without difficulty or complication. The needle was removed intact. L5 Medial Branch Nerve Block (MBB): The target area for the L5 medial branch is at the junction of the postero-lateral aspect of the superior articular process and the superior, posterior, and medial edge of the sacral ala. Under fluoroscopic guidance, a Quincke needle was inserted until contact was made with os over the superior postero-lateral aspect of the pedicular shadow (target area). After negative aspiration for blood,1.5 mL of the nerve block solution was injected without difficulty or complication. The needle was removed intact.  Nerve block solution: 10 cc solution made of 8 cc of 0.2% ropivacaine, 2 cc of Decadron 10 mg/cc.  1.5 cc injected at each level above bilaterally.  The unused portion of the solution was discarded in the proper designated containers. Procedural Needles: 22-gauge, 3.5-inch, Quincke needles used for all levels.  Once the entire procedure was completed, the treated area was cleaned, making sure to leave some of the prepping solution back to take advantage of its long term bactericidal properties.   Illustration of the posterior view of the lumbar spine and the posterior neural structures. Laminae of L2 through S1 are labeled. DPRL5, dorsal primary ramus of L5; DPRS1, dorsal primary ramus of S1; DPR3, dorsal primary ramus of L3; FJ, facet (zygapophyseal) joint L3-L4; I, inferior articular process of L4; LB1, lateral branch of dorsal primary ramus of L1; IAB, inferior articular branches  from L3 medial branch (supplies L4-L5 facet joint); IBP, intermediate branch plexus; MB3, medial branch of dorsal primary ramus of L3; NR3, third lumbar nerve root; S, superior articular process of L5; SAB, superior articular branches from L4 (supplies L4-5 facet joint also); TP3, transverse process of L3.  Vitals:   06/02/20 1139 06/02/20 1149 06/02/20 1159 06/02/20 1207  BP: (!) 145/95 (!) 142/99 (!) 142/78 (!) 151/76  Pulse:  81 79 76  Resp: 17 16 16 17   Temp:  97.8 F (36.6 C)    TempSrc:  Tympanic    SpO2: 98% 99% 99% 98%  Weight:      Height:         Start Time: 1124 hrs. End Time: 1138 hrs.  Imaging Guidance (Spinal):          Type of Imaging Technique: Fluoroscopy Guidance (Spinal) Indication(s): Assistance in needle guidance and placement for procedures requiring needle placement in or near specific anatomical locations not easily accessible without such assistance. Exposure Time: Please see nurses notes. Contrast: None used. Fluoroscopic Guidance: I was personally present during the use of fluoroscopy. "Tunnel Vision Technique" used to obtain the best possible view of the target area. Parallax error corrected before commencing the procedure. "Direction-depth-direction" technique used to introduce the needle under continuous pulsed fluoroscopy. Once target was reached, antero-posterior, oblique, and lateral fluoroscopic projection used confirm needle placement in all planes. Images permanently stored in EMR. Interpretation: No contrast injected. I personally interpreted the imaging intraoperatively. Adequate needle placement confirmed in multiple planes. Permanent images saved into the patient's record.   Post-operative Assessment:  Post-procedure Vital Signs:  Pulse/HCG Rate: 7676 Temp: 97.8 F (36.6 C) Resp: 17 BP: (!) 151/76 SpO2: 98 %  EBL: None  Complications: No immediate post-treatment complications observed by team, or reported by patient.  Note: The patient  tolerated the entire procedure well. A repeat set of vitals were taken after the procedure and the patient was kept under observation following institutional policy, for this type of procedure. Post-procedural neurological assessment was performed, showing return to baseline, prior to discharge. The patient was provided with post-procedure discharge instructions, including a section on how to identify potential problems. Should any problems arise concerning this procedure, the patient was given instructions to immediately contact us, at any time, without hesitation. In any case, we plan to contact the patient by telephone for a follow-up status report regarding this interventional procedure.  Comments:  No additional relevant information.  Plan of Care  Orders:  Orders Placed This Encounter  Procedures  . [facility] C-ARM 1-60 MIN NO REPORT    Intraoperative interpretation by procedural physician at [place].    Standing Status:   Standing    Number of Occurrences:   1    Order Specific Question:   Reason for exam:    Answer:   Assistance in needle guidance and placement for procedures requiring needle placement in or near specific anatomical locations not easily accessible without such assistance.    Medications ordered for procedure: Meds ordered this encounter  Medications  . lidocaine (XYLOCAINE) 2 % (with pres) injection 400 mg  . fentaNYL (SUBLIMAZE) injection 25-50 mcg    Make sure Narcan is available in the pyxis when using this medication. In the event of respiratory depression (RR< 8/min): Titrate NARCAN (naloxone) in increments of 0.1 to 0.2 mg IV at 2-3 minute intervals, until desired degree of reversal.  . ropivacaine (PF) 2 mg/mL (0.2%) (NAROPIN) injection 9 mL  . ropivacaine (PF) 2 mg/mL (0.2%) (NAROPIN) injection 9 mL  . dexamethasone (DECADRON) injection 10 mg  . dexamethasone (DECADRON) injection 20 mg   Medications administered: We administered  lidocaine, fentaNYL, ropivacaine (PF) 2 mg/mL (0.2%), ropivacaine (PF) 2 mg/mL (0.2%), dexamethasone, and dexamethasone.  See the medical record for exact dosing, route, and time of administration.  Follow-up  plan:   Return in about 4 weeks (around 06/30/2020) for Post Procedure Evaluation, virtual.      History of L4-5 laminectomy with Dr. Lacinda Axon due to synovial cyst now having left L4-L5 radicular pain status post L5-S1 ESI on the left on 03/05/2019, 02/16/20.  Status post bilateral L3, L4, L5 lumbar facet medial branch nerve block 06/02/2020.     Recent Visits Date Type Provider Dept  03/15/20 Telemedicine Gillis Santa, MD [facility]  Showing recent visits within past 90 days and meeting all other requirements Today's Visits Date Type Provider Dept  06/02/20 Procedure visit Gillis Santa, MD [facility]  Showing today's visits and meeting all other requirements Future Appointments Date Type Provider Dept  06/30/20 Appointment Gillis Santa, MD [facility]  Showing future appointments within next 90 days and meeting all other requirements  Disposition: Discharge home  Discharge (Date  Time): 06/02/2020; 1208 hrs.   Primary Care Physician: Patient, No Pcp Per Location: [facility] Note by: Gillis Santa, MD Date: 06/02/2020; Time: 12:26 PM  Disclaimer:  Medicine is not an exact science. The only guarantee in medicine is that nothing is guaranteed. It is important to note that the decision to proceed with this intervention was based on the information collected from the patient. The Data and conclusions were drawn from the patient's questionnaire, the interview, and the physical examination. Because the information was provided in large part by the patient, it cannot be guaranteed that it has not been purposely or unconsciously manipulated. Every effort has been made to obtain as much relevant data as possible for this evaluation. It  is important to note that the conclusions that lead to this procedure are derived in large part from the available data. Always take into account that the treatment will also be dependent on availability of resources and existing treatment guidelines, considered by other Pain Management Practitioners as being common knowledge and practice, at the time of the intervention. For Medico-Legal purposes, it is also important to point out that variation in procedural techniques and pharmacological choices are the acceptable norm. The indications, contraindications, technique, and results of the above procedure should only be interpreted and judged by a Board-Certified Interventional Pain Specialist with extensive familiarity and expertise in the same exact procedure and technique.

## 2020-06-03 ENCOUNTER — Telehealth: Payer: Self-pay

## 2020-06-03 NOTE — Telephone Encounter (Signed)
Post procedure follow up.  Patient states she is doing good except her blood sugar was 6oo.  Instructed patient to notify her PCP for additional coverage.  [facility] [place] notified.

## 2020-06-04 ENCOUNTER — Other Ambulatory Visit: Payer: Self-pay

## 2020-06-04 ENCOUNTER — Telehealth: Payer: Self-pay

## 2020-06-04 MED ORDER — METFORMIN HCL 500 MG PO TABS: 1000.0000 mg | ORAL_TABLET | Freq: Two times a day (BID) | ORAL | 2 refills | Status: DC

## 2020-06-04 NOTE — Telephone Encounter (Signed)
Ok to refill metformin? Patient has been made aware to contact [facility] to establish with a new PCP.

## 2020-06-04 NOTE — Telephone Encounter (Signed)
Called and spoke to [facility]. She states that she has not established with a new PCP. I gave her the number at [facility] and instructed her to contact them and be established so she can get a refill on her medication.

## 2020-06-14 ENCOUNTER — Ambulatory Visit: Payer: Self-pay | Admitting: *Deleted

## 2020-06-14 NOTE — Telephone Encounter (Signed)
Reviewed, patient not established on office visit yet for new patient. Needs to be seen in ED or Urgent care or establish sooner with another PCP if able.

## 2020-06-14 NOTE — Telephone Encounter (Signed)
  C/o blood glucose this am at 0930 for 369. paitent ate and took prescribed metformin 1000 mg. Rechecked blood glucose for 266 at 1108. Normal range in 140's. C/o feeling a "lag" and denies confusion. Patient reports she is at work and has had frequent urination at night and denies fever, weakness, rapid pulse or breathing. Denies blurred vision or irritability. New patient set up for M. Flinchum, FNP in 1 month. Encouraged patient to increase water intake and attempt a brisk walk and recheck blood glucose today. Encouraged patient to call back if blood glucose remains elevated above her normal  Levels. Care advise given. Patient verbalized understanding of care advise and to call back or go to [facility] or ED if symptoms worsen.   Reason for Disposition . [1] Blood glucose 240 - 300 mg/dL (13.3 - 16.7 mmol/L) AND [2] does not  use insulin (e.g., not insulin-dependent; most people with type 2 diabetes)  Answer Assessment - Initial Assessment Questions 1. BLOOD GLUCOSE: "What is your blood glucose level?"      266 at 1108 2. ONSET: "When did you check the blood glucose?"     1108 3. USUAL RANGE: "What is your glucose level usually?" (e.g., usual fasting morning value, usual evening value)     140's 4. KETONES: "Do you check for ketones (urine or blood test strips)?" If yes, ask: "What does the test show now?"      na 5. TYPE 1 or 2:  "Do you know what type of diabetes you have?"  (e.g., Type 1, Type 2, Gestational; doesn't know)      Does not know  6. INSULIN: "Do you take insulin?" "What type of insulin(s) do you use? What is the mode of delivery? (syringe, pen; injection or pump)?"      na 7. DIABETES PILLS: "Do you take any pills for your diabetes?" If yes, ask: "Have you missed taking any pills recently?"     Metformin 1000 mg 2 times a day  8. OTHER SYMPTOMS: "Do you have any symptoms?" (e.g., fever, frequent urination, difficulty breathing, dizziness, weakness, vomiting)     Frequent urination.  "feels like in a fog" 9. PREGNANCY: "Is there any chance you are pregnant?" "When was your last menstrual period?"     na  Protocols used: DIABETES - HIGH BLOOD SUGAR-A-AH

## 2020-06-14 NOTE — Telephone Encounter (Signed)
Please review, triage nurse note below. KW

## 2020-06-15 NOTE — Telephone Encounter (Signed)
Patient was advised. KW 

## 2020-06-29 ENCOUNTER — Encounter: Payer: Self-pay | Admitting: Student in an Organized Health Care Education/Training Program

## 2020-06-30 ENCOUNTER — Encounter: Payer: Self-pay | Admitting: Student in an Organized Health Care Education/Training Program

## 2020-06-30 ENCOUNTER — Ambulatory Visit
Payer: Managed Care, Other (non HMO) | Attending: Student in an Organized Health Care Education/Training Program | Admitting: Student in an Organized Health Care Education/Training Program

## 2020-06-30 ENCOUNTER — Other Ambulatory Visit: Payer: Self-pay

## 2020-06-30 DIAGNOSIS — G894 Chronic pain syndrome: Secondary | ICD-10-CM | POA: Diagnosis not present

## 2020-06-30 DIAGNOSIS — M47816 Spondylosis without myelopathy or radiculopathy, lumbar region: Secondary | ICD-10-CM | POA: Diagnosis not present

## 2020-06-30 DIAGNOSIS — Z9889 Other specified postprocedural states: Secondary | ICD-10-CM

## 2020-06-30 NOTE — Progress Notes (Signed)
Patient: Sherry Hobbs  Service Category: E/M  Provider: Gillis Santa, MD  DOB: 1970/07/26  DOS: 06/30/2020  Location: Office  MRN: [id number]  Setting: Ambulatory outpatient  Referring Provider: No ref. provider found  Type: Established Patient  Specialty: Interventional Pain Management  PCP: Patient, No Pcp Per  Location: Home  Delivery: TeleHealth     Virtual Encounter - Pain Management PROVIDER NOTE: Information contained herein reflects review and annotations entered in association with encounter. Interpretation of such information and data should be left to medically-trained personnel. Information provided to patient can be located elsewhere in the medical record under "Patient Instructions". Document created using STT-dictation technology, any transcriptional errors that may result from process are unintentional.    Contact & Pharmacy Preferred: [phone] Home: [phone] (home) Mobile: [phone] (mobile) E-mail: [email or web address]  [id number] [place]: [phone]: [address]: [phone]: [phone]  Pre-screening  Sherry Hobbs offered "in-person" vs "virtual" encounter. She indicated preferring virtual for this encounter.   Reason COVID-19*  Social distancing based on CDC and AMA recommendations.   I contacted Sherry Moccasinon 06/30/2020 via video conference.      I clearly identified myself as BGillis Santa MD. I verified that I was speaking with the correct person using two identifiers (Name: Sherry Hobbs and date of birth: [birth year].  Consent I sought verbal advanced consent from Sherry Moccasinfor virtual visit interactions. I informed Sherry Hobbs of possible security and privacy concerns, risks, and limitations associated with providing  "not-in-person" medical evaluation and management services. I also informed Sherry Hobbs of the availability of "in-person" appointments. Finally, I informed her that there would be a charge for the virtual visit and that she could be  personally, fully or partially, financially responsible for it. Ms. BJeanmarieexpressed understanding and agreed to proceed.   Historic Elements   Ms. CASHLINN HEMRICKis a 50y.o. year old, female patient evaluated today after our last contact on 06/02/2020. Sherry Hobbs has a past medical history of Anemia, Arthritis, Depression, Diabetes mellitus without complication ([place], DM (diabetes mellitus) ([place] (08/07/2019), Dysuria (12/01/2019), Elevated serum creatinine, GERD (gastroesophageal reflux disease), Headache, History of blood transfusion (05/02/2016), HTN (hypertension) (08/07/2019), Hypertension, and Neuropathy. She also  has a past surgical history that includes Dilation and curettage of uterus (05/03/2016); Dilatation & currettage/hysteroscopy with novasure ablation (N/A, 12/03/2017); Lumbar laminectomy/decompression microdiscectomy (Left, 06/24/2018); Back surgery; Laparoscopic assisted vaginal hysterectomy (N/A, 07/07/2019); Laparoscopic bilateral salpingectomy (Bilateral, 07/07/2019); and Abdominal hysterectomy. Ms. BOregonhas a current medication list which includes the following prescription(s): cholecalciferol, cyanocobalamin, docusate sodium, ferrous sulfate, hydrocortisone, ibuprofen, ketoconazole, lisinopril, metformin, methocarbamol, pantoprazole, pregabalin, rosuvastatin, triamcinolone, and sucralfate. She  reports that she has never smoked. She has never used smokeless tobacco. She reports current alcohol use. She reports that she does not use drugs. Ms. BSpethis allergic to codeine.   HPI  Today, she is being contacted for a post-procedure assessment.  Post-Procedure Evaluation  Procedure (06/02/2020):  Type: Lumbar Facet, Medial Branch Block(s) #1  Primary  Purpose: Diagnostic Region: Posterolateral Lumbosacral Spine Level: L3, L4, L5,  Medial Branch Level(s). Injecting these levels blocks the L3-4, L4-5,  lumbar facet joints. Laterality: Bilateral  Sedation: Please see nurses note.  Effectiveness during initial hour after procedure(Ultra-Short Term Relief): 100 %  Local anesthetic  used: Long-acting (4-6 hours) Effectiveness: Defined as any analgesic benefit obtained secondary to the administration of local anesthetics. This carries significant diagnostic value as to the etiological location, or anatomical origin, of the pain. Duration of benefit is expected to coincide with the duration of the local anesthetic used.  Effectiveness during initial 4-6 hours after procedure(Short-Term Relief): 50 %  Long-term benefit: Defined as any relief past the pharmacologic duration of the local anesthetics.  Effectiveness past the initial 6 hours after procedure(Long-Term Relief): 100 %   Current benefits: Defined as benefit that persist at this time.   Analgesia:  90-100% better Function: Sherry Hobbs reports improvement in function ROM: Sherry Hobbs reports improvement in ROM  Laboratory Chemistry Profile   Renal Lab Results  Component Value Date   BUN 10 03/15/2020   CREATININE 1.02 03/15/2020   GFR 65.79 03/15/2020   GFRAA >60 11/21/2019   GFRNONAA >60 11/21/2019     Hepatic Lab Results  Component Value Date   AST 22 03/15/2020   ALT 27 03/15/2020   ALBUMIN 3.9 03/15/2020   ALKPHOS 94 03/15/2020   HCVAB <0.1 03/30/2016   LIPASE 34 11/21/2019     Electrolytes Lab Results  Component Value Date   NA 140 03/15/2020   K 4.2 03/15/2020   CL 103 03/15/2020   CALCIUM 9.4 03/15/2020     Bone No results found for: VD25OH, VD125OH2TOT, ZO1096EA5, WU9811BJ4, 25OHVITD1, 25OHVITD2, 25OHVITD3, TESTOFREE, TESTOSTERONE   Inflammation (CRP: Acute Phase) (ESR: Chronic Phase) No results found for: CRP, ESRSEDRATE, LATICACIDVEN     Note: Above Lab  results reviewed.  Assessment  The primary encounter diagnosis was Lumbar facet arthropathy. Diagnoses of History of lumbar surgery and Chronic pain syndrome were also pertinent to this visit.  Plan of Care   Significant pain relief and improvement in functional status after previous lumbar facet medial branch nerve blocks bilaterally at L3, L4, L5 on 06/02/2020.  Patient is still experiencing pain relief.  Instructed her to follow-up as needed for repeat lumbar facet medial branch nerve blocks if she has return of low back pain to the same level as before.  Patient endorsed understanding.  Orders:  Orders Placed This Encounter  Procedures  . LUMBAR FACET(MEDIAL BRANCH NERVE BLOCK) MBNB    Scheduling timeframe: (PRN procedure) Sherry Hobbs will call when needed. Clinical indication: Axial low back pain. 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  Sedation: Usually done with sedation. (May be done without sedation if so desired by patient.) Requirements: NPO x 8 hrs.; Driver; Stop blood thinners. Interval: No sooner than two weeks for diagnostic or therapeutic. No sooner than every other month for palliative.    Standing Status:   Standing    Number of Occurrences:   5    Standing Expiration Date:   06/30/2021    Scheduling Instructions:     Procedure: Lumbar facet block (AKA.: Lumbosacral medial branch nerve block)     Level: L3-4, L4-5, Facets (L3, L4, L5,  Medial Branch Nerves)     Laterality: Bilateral    Order Specific Question:   Where will this procedure be performed?    Answer:   [facility]   Follow-up plan:   Return if symptoms worsen or fail to improve.     History of L4-5 laminectomy with Dr. Lacinda Axon due to synovial cyst now having left L4-L5 radicular pain status post L5-S1 ESI on the left on 03/05/2019, 02/16/20.  Status post bilateral L3, L4, L5 lumbar facet medial branch nerve block 06/02/2020.  Recent Visits Date Type Provider Dept  06/02/20 Procedure visit Gillis Santa,  MD [facility]  Showing recent visits within past 90 days and meeting all other requirements Today's Visits Date Type Provider Dept  06/30/20 Telemedicine Gillis Santa, MD [facility]  Showing today's visits and meeting all other requirements Future Appointments No visits were found meeting these conditions. Showing future appointments within next 90 days and meeting all other requirements  I discussed the assessment and treatment plan with the patient. The patient was provided an opportunity to ask questions and all were answered. The patient agreed with the plan and demonstrated an understanding of the instructions.  Patient advised to call back or seek an in-person evaluation if the symptoms or condition worsens.  Duration of encounter: 18mnutes.  Note by: BGillis Santa MD Date: 06/30/2020; Time: 4:56 PM

## 2020-08-12 ENCOUNTER — Other Ambulatory Visit: Payer: Self-pay

## 2020-08-12 ENCOUNTER — Ambulatory Visit (INDEPENDENT_AMBULATORY_CARE_PROVIDER_SITE_OTHER): Payer: Managed Care, Other (non HMO) | Admitting: Adult Health

## 2020-08-12 ENCOUNTER — Encounter: Payer: Self-pay | Admitting: Adult Health

## 2020-08-12 VITALS — BP 137/65 | HR 71 | Temp 98.2°F | Resp 16 | Ht 62.0 in | Wt 184.8 lb

## 2020-08-12 DIAGNOSIS — E559 Vitamin D deficiency, unspecified: Secondary | ICD-10-CM

## 2020-08-12 DIAGNOSIS — E538 Deficiency of other specified B group vitamins: Secondary | ICD-10-CM

## 2020-08-12 DIAGNOSIS — E785 Hyperlipidemia, unspecified: Secondary | ICD-10-CM | POA: Diagnosis not present

## 2020-08-12 DIAGNOSIS — E1159 Type 2 diabetes mellitus with other circulatory complications: Secondary | ICD-10-CM

## 2020-08-12 DIAGNOSIS — I152 Hypertension secondary to endocrine disorders: Secondary | ICD-10-CM

## 2020-08-12 DIAGNOSIS — E119 Type 2 diabetes mellitus without complications: Secondary | ICD-10-CM

## 2020-08-12 DIAGNOSIS — Z1389 Encounter for screening for other disorder: Secondary | ICD-10-CM | POA: Diagnosis not present

## 2020-08-12 DIAGNOSIS — Z1211 Encounter for screening for malignant neoplasm of colon: Secondary | ICD-10-CM

## 2020-08-12 DIAGNOSIS — Z1231 Encounter for screening mammogram for malignant neoplasm of breast: Secondary | ICD-10-CM | POA: Diagnosis not present

## 2020-08-12 LAB — POCT URINALYSIS DIPSTICK
Bilirubin, UA: NEGATIVE
Blood, UA: NEGATIVE
Glucose, UA: NEGATIVE
Ketones, UA: NEGATIVE
Leukocytes, UA: NEGATIVE
Nitrite, UA: NEGATIVE
Protein, UA: NEGATIVE
Spec Grav, UA: 1.01 (ref 1.010–1.025)
Urobilinogen, UA: 0.2 E.U./dL
pH, UA: 5 (ref 5.0–8.0)

## 2020-08-12 MED ORDER — LISINOPRIL 10 MG PO TABS: 10.0000 mg | ORAL_TABLET | ORAL | 1 refills | Status: DC

## 2020-08-12 NOTE — Progress Notes (Signed)
New patient visit   Patient: Sherry Hobbs   DOB: 23-Sep-1970   50 y.o. Female  MRN: [id number] Visit Date: 08/12/2020  Today's healthcare provider: Marcille Buffy, FNP   Chief Complaint  Patient presents with  . New Patient (Initial Visit)   Subjective    Sherry Hobbs is a 50 y.o. female who presents today as a new patient to establish care.  HPI  Patient presents in office to establish care she states that she feels well today and has no concerns to address. Patient reports that she follows a general diet, she is staying active by walking 3x a week and reports that sleep habits are poor due to difficulty falling asleep at night.   Patient  denies any fever, body aches,chills, rash, chest pain, shortness of breath, nausea, vomiting, or diarrhea.  Denies dizziness, lightheadedness, pre syncopal or syncopal episodes.    Past Medical History:  Diagnosis Date  . Anemia    problems when having abnmornal uterine bleeding.  . Arthritis    lower back  . Depression   . Diabetes mellitus without complication ([place])   . DM (diabetes mellitus) ([place]) 08/07/2019  . Dysuria 12/01/2019  . Elevated serum creatinine   . GERD (gastroesophageal reflux disease)    OCC  NO MEDS  . Headache    occasional migraines  . History of blood transfusion 05/02/2016   due to abnormal uterine bleeding  . HTN (hypertension) 08/07/2019  . Hypertension   . Neuropathy    feet   Past Surgical History:  Procedure Laterality Date  . ABDOMINAL HYSTERECTOMY    . BACK SURGERY    . DILATION AND CURETTAGE OF UTERUS  05/03/2016   Procedure: DILATATION AND CURETTAGE;  Surgeon: [name], MD;  Location: [facility] ORS;  Service: Gynecology;;  . Murrell Redden & CURRETTAGE/HYSTROSCOPY WITH NOVASURE ABLATION N/A 12/03/2017   Procedure: DILATATION & CURETTAGE/HYSTEROSCOPY WITH MINERVA ABLATION;  Surgeon: [name], MD;  Location: [facility] OR[facility];  Service: Gynecology;  Laterality: N/A;  MINERVA REP CONTACTED  [facility] [phone])  . LAPAROSCOPIC ASSISTED VAGINAL HYSTERECTOMY N/A 07/07/2019   Procedure: LAPAROSCOPIC ASSISTED VAGINAL HYSTERECTOMY;  Surgeon: [name], MD;  Location: [facility] ORS;  Service: Gynecology;  Laterality: N/A;  possible TVH  . LAPAROSCOPIC BILATERAL SALPINGECTOMY Bilateral 07/07/2019   Procedure: LAPAROSCOPIC BILATERAL SALPINGECTOMY;  Surgeon: [name], MD;  Location: [facility];  Service: Gynecology;  Laterality: Bilateral;  . LUMBAR LAMINECTOMY/DECOMPRESSION MICRODISCECTOMY Left 06/24/2018   Procedure: LUMBAR HEMILAMINECTOMY, FORAMINOTOMY, CYST REMOVAL - 1 LEVEL LEFT L4-5;  Surgeon: [name], MD;  Location: [facility] OR[facility];  Service: Neurosurgery;  Laterality: Left;   Family Status  Relation Name Status  . Mother  Alive  . Father  Deceased at age 59       died from massive heart attack  . Sister  (Not Specified)  . Brother  (Not Specified)  . Mat Uncle  Deceased  . MGM  Deceased  . MGF  Deceased  . Brother  Deceased  . Sister Kendell Bane, age 32y   Family History  Problem Relation Age of Onset  . Diabetes Mother   . COPD Mother   . Emphysema Mother   . Osteoporosis Mother   . Lung cancer Mother 32  . Breast cancer Mother   . CAD Father   . Diabetes Sister   . Diabetes Brother   . CAD Brother   . Celiac disease Brother   . Renal cancer Maternal Uncle  1 uncle  . Brain cancer Maternal Uncle        different uncle  . Diabetes Maternal Grandmother   . Stroke Maternal Grandmother   . Bladder Cancer Maternal Grandfather   . Diabetes Brother   . Heart disease Brother   . Cancer Sister   . Nevi Sister    Social History   Socioeconomic History  . Marital status: Married    Spouse name: Gwyndolyn Saxon  . Number of children: Not on file  . Years of education: Not on file  . Highest education level: Not on file  Occupational History  . Occupation: Solicitor  Tobacco Use  . Smoking status: Never Smoker  . Smokeless tobacco: Never  Used  Vaping Use  . Vaping Use: Never used  Substance and Sexual Activity  . Alcohol use: Yes    Comment: occassional  . Drug use: No  . Sexual activity: Not Currently    Birth control/protection: None  Other Topics Concern  . Not on file  Social History Narrative  . Not on file   Social Determinants of Health   Financial Resource Strain: Not on file  Food Insecurity: Not on file  Transportation Needs: Not on file  Physical Activity: Not on file  Stress: Not on file  Social Connections: Not on file   Outpatient Medications Prior to Visit  Medication Sig  . Ascorbic Acid (VITAMIN C PO) Take by mouth.  . cholecalciferol (VITAMIN D3) 25 MCG (1000 UNIT) tablet Take 1,000 Units by mouth daily.  . cyanocobalamin (,VITAMIN B-12,) 1000 MCG/ML injection 1000 mcg (1 mg) injection once per week for four weeks, followed by 1000 mcg injection once per month. (Patient taking differently: Inject 1,000 mcg into the muscle every 30 (thirty) days.)  . docusate sodium (COLACE) 100 MG capsule Take 100 mg by mouth 2 (two) times daily as needed for mild constipation.  . gabapentin (NEURONTIN) 100 MG capsule Take 200 mg by mouth 2 (two) times daily.  Marland Kitchen MAGNESIUM OXIDE PO Take by mouth.  . metFORMIN (GLUCOPHAGE) 500 MG tablet Take 2 tablets (1,000 mg total) by mouth 2 (two) times daily with a meal.  . methocarbamol (ROBAXIN) 750 MG tablet Take 1 tablet (750 mg total) by mouth every 8 (eight) hours as needed for muscle spasms.  . pregabalin (LYRICA) 75 MG capsule Take by mouth.  . Pyridoxine HCl (VITAMIN B6 PO) Take by mouth.  . rosuvastatin (CRESTOR) 10 MG tablet Take 1 tablet (10 mg total) by mouth daily.  . [DISCONTINUED] lisinopril (ZESTRIL) 10 MG tablet Take 1 tablet (10 mg total) by mouth every morning.  . [DISCONTINUED] ferrous sulfate 325 (65 FE) MG tablet Take 325 mg by mouth 2 (two) times daily.   . [DISCONTINUED] hydrocortisone 2.5 % cream Apply twice daily to affected area with rash, for up  to 1 to 2 weeks only  . [DISCONTINUED] ibuprofen (ADVIL) 800 MG tablet Take 1 tablet (800 mg total) by mouth every 8 (eight) hours as needed.  . [DISCONTINUED] ketoconazole (NIZORAL) 2 % cream Apply twice daily to affected area with rash, for up to 1 to 2 weeks only  . [DISCONTINUED] pantoprazole (PROTONIX) 20 MG tablet Take 1 tablet (20 mg total) by mouth daily.  . [DISCONTINUED] pregabalin (LYRICA) 50 MG capsule Take 50 mg by mouth 3 (three) times daily.  . [DISCONTINUED] sucralfate (CARAFATE) 1 g tablet Take 1 tablet (1 g total) by mouth 4 (four) times daily for 15 days. (Patient not taking: Reported on  06/02/2020)  . [DISCONTINUED] triamcinolone cream (KENALOG) 0.1 % Apply to affected areas where itchy rash twice daily until rash is gone,  stop when gone. Avoid Face, groin, and underarms   No facility-administered medications prior to visit.   Allergies  Allergen Reactions  . Codeine Nausea And Vomiting    Immunization History  Administered Date(s) Administered  . Hepatitis B 01/13/1992, 02/10/1992, 07/12/1992  . Influenza-Unspecified 03/04/2020  . PFIZER(Purple Top)SARS-COV-2 Vaccination 05/20/2019, 06/10/2019  . Td 02/10/1992, 10/28/2010  . Tdap 08/07/2019    Health Maintenance  Topic Date Due  . PNEUMOCOCCAL POLYSACCHARIDE VACCINE AGE 40-64 HIGH RISK  Never done  . FOOT EXAM  Never done  . PAP SMEAR-Modifier  Never done  . COLONOSCOPY (Pts 45-68yr Insurance coverage will need to be confirmed)  Never done  . COVID-19 Vaccine (3 - Pfizer risk 4-dose series) 07/08/2019  . INFLUENZA VACCINE  12/13/2020  . HEMOGLOBIN A1C  02/11/2021  . OPHTHALMOLOGY EXAM  05/17/2021  . TETANUS/TDAP  08/06/2029  . Hepatitis C Screening  Completed  . HIV Screening  Completed  . HPV VACCINES  Aged Out    Patient Care Team: Keilon Ressel, MKelby Aline FNP as PCP - General (Family Medicine)  Review of Systems  HENT: Positive for dental problem.   Cardiovascular: Positive for leg swelling.   Musculoskeletal: Positive for back pain and myalgias.  Neurological: Positive for numbness.  All other systems reviewed and are negative.     Objective    BP 137/65   Pulse 71   Temp 98.2 F (36.8 C) (Oral)   Resp 16   Ht '5\' 2"'  (1.575 m)   Wt 184 lb 12.8 oz (83.8 kg)   LMP 11/26/2017   SpO2 98%   BMI 33.80 kg/m  Physical Exam Vitals reviewed.  Constitutional:      General: She is not in acute distress.    Appearance: She is well-developed. She is not diaphoretic.     Interventions: She is not intubated. HENT:     Head: Normocephalic and atraumatic.     Right Ear: External ear normal.     Left Ear: External ear normal.     Nose: Nose normal.     Mouth/Throat:     Pharynx: No oropharyngeal exudate.  Eyes:     General: Lids are normal. No scleral icterus.       Right eye: No discharge.        Left eye: No discharge.     Conjunctiva/sclera: Conjunctivae normal.     Right eye: Right conjunctiva is not injected. No exudate or hemorrhage.    Left eye: Left conjunctiva is not injected. No exudate or hemorrhage.    Pupils: Pupils are equal, round, and reactive to light.  Neck:     Thyroid: No thyroid mass or thyromegaly.     Vascular: Normal carotid pulses. No carotid bruit, hepatojugular reflux or JVD.     Trachea: Trachea and phonation normal. No tracheal tenderness or tracheal deviation.     Meningeal: Brudzinski's sign and Kernig's sign absent.  Cardiovascular:     Rate and Rhythm: Normal rate and regular rhythm.     Pulses: Normal pulses.          Radial pulses are 2+ on the right side and 2+ on the left side.       Dorsalis pedis pulses are 2+ on the right side and 2+ on the left side.       Posterior tibial pulses are 2+ on the  right side and 2+ on the left side.     Heart sounds: Normal heart sounds, S1 normal and S2 normal. Heart sounds not distant. No murmur heard. No friction rub. No gallop.   Pulmonary:     Effort: Pulmonary effort is normal. No tachypnea,  bradypnea, accessory muscle usage or respiratory distress. She is not intubated.     Breath sounds: Normal breath sounds. No stridor. No wheezing or rales.  Chest:     Chest wall: No tenderness.  Breasts:     Right: No supraclavicular adenopathy.     Left: No supraclavicular adenopathy.    Abdominal:     General: Bowel sounds are normal. There is no distension or abdominal bruit.     Palpations: Abdomen is soft. There is no shifting dullness, fluid wave, hepatomegaly, splenomegaly, mass or pulsatile mass.     Tenderness: There is no abdominal tenderness. There is no guarding or rebound.     Hernia: No hernia is present.  Musculoskeletal:        General: No tenderness or deformity. Normal range of motion.     Cervical back: Full passive range of motion without pain, normal range of motion and neck supple. No edema, erythema or rigidity. No spinous process tenderness or muscular tenderness. Normal range of motion.  Lymphadenopathy:     Head:     Right side of head: No submental, submandibular, tonsillar, preauricular, posterior auricular or occipital adenopathy.     Left side of head: No submental, submandibular, tonsillar, preauricular, posterior auricular or occipital adenopathy.     Cervical: No cervical adenopathy.     Right cervical: No superficial, deep or posterior cervical adenopathy.    Left cervical: No superficial, deep or posterior cervical adenopathy.     Upper Body:     Right upper body: No supraclavicular or pectoral adenopathy.     Left upper body: No supraclavicular or pectoral adenopathy.  Skin:    General: Skin is warm and dry.     Coloration: Skin is not pale.     Findings: No abrasion, bruising, burn, ecchymosis, erythema, lesion, petechiae or rash.     Nails: There is no clubbing.  Neurological:     Mental Status: She is alert and oriented to person, place, and time.     GCS: GCS eye subscore is 4. GCS verbal subscore is 5. GCS motor subscore is 6.     Cranial  Nerves: No cranial nerve deficit.     Sensory: No sensory deficit.     Motor: No tremor, atrophy, abnormal muscle tone or seizure activity.     Coordination: Coordination normal.     Gait: Gait normal.     Deep Tendon Reflexes: Reflexes are normal and symmetric. Reflexes normal. Babinski sign absent on the right side. Babinski sign absent on the left side.     Reflex Scores:      Tricep reflexes are 2+ on the right side and 2+ on the left side.      Bicep reflexes are 2+ on the right side and 2+ on the left side.      Brachioradialis reflexes are 2+ on the right side and 2+ on the left side.      Patellar reflexes are 2+ on the right side and 2+ on the left side.      Achilles reflexes are 2+ on the right side and 2+ on the left side. Psychiatric:        Speech: Speech normal.  Behavior: Behavior normal.        Thought Content: Thought content normal.        Judgment: Judgment normal.    Depression Screen PHQ 2/9 Scores 08/12/2020 06/02/2020 08/07/2019 03/05/2019  PHQ - 2 Score 0 0 0 0  PHQ- 9 Score 4 - - -   Results for orders placed or performed in visit on 08/12/20  CBC with Differential/Platelet  Result Value Ref Range   WBC 9.3 3.4 - 10.8 x10E3/uL   RBC 4.37 3.77 - 5.28 x10E6/uL   Hemoglobin 12.5 11.1 - 15.9 g/dL   Hematocrit 36.2 34.0 - 46.6 %   MCV 83 79 - 97 fL   MCH 28.6 26.6 - 33.0 pg   MCHC 34.5 31.5 - 35.7 g/dL   RDW 13.4 11.7 - 15.4 %   Platelets 221 150 - 450 x10E3/uL   Neutrophils 72 Not Estab. %   Lymphs 18 Not Estab. %   Monocytes 7 Not Estab. %   Eos 2 Not Estab. %   Basos 1 Not Estab. %   Neutrophils Absolute 6.7 1.4 - 7.0 x10E3/uL   Lymphocytes Absolute 1.7 0.7 - 3.1 x10E3/uL   Monocytes Absolute 0.7 0.1 - 0.9 x10E3/uL   EOS (ABSOLUTE) 0.2 0.0 - 0.4 x10E3/uL   Basophils Absolute 0.1 0.0 - 0.2 x10E3/uL   Immature Granulocytes 0 Not Estab. %   Immature Grans (Abs) 0.0 0.0 - 0.1 x10E3/uL  Comprehensive metabolic panel  Result Value Ref Range    Glucose 126 (H) 65 - 99 mg/dL   BUN 12 6 - 24 mg/dL   Creatinine, Ser 1.06 (H) 0.57 - 1.00 mg/dL   eGFR 65 >59 mL/min/1.73   BUN/Creatinine Ratio 11 9 - 23   Sodium 140 134 - 144 mmol/L   Potassium 4.4 3.5 - 5.2 mmol/L   Chloride 103 96 - 106 mmol/L   CO2 21 20 - 29 mmol/L   Calcium 9.5 8.7 - 10.2 mg/dL   Total Protein 6.8 6.0 - 8.5 g/dL   Albumin 4.3 3.8 - 4.8 g/dL   Globulin, Total 2.5 1.5 - 4.5 g/dL   Albumin/Globulin Ratio 1.7 1.2 - 2.2   Bilirubin Total 0.7 0.0 - 1.2 mg/dL   Alkaline Phosphatase 107 44 - 121 IU/L   AST 63 (H) 0 - 40 IU/L   ALT 59 (H) 0 - 32 IU/L  Lipid panel  Result Value Ref Range   Cholesterol, Total 103 100 - 199 mg/dL   Triglycerides 177 (H) 0 - 149 mg/dL   HDL 27 (L) >39 mg/dL   VLDL Cholesterol Cal 30 5 - 40 mg/dL   LDL Chol Calc (NIH) 46 0 - 99 mg/dL   Chol/HDL Ratio 3.8 0.0 - 4.4 ratio  TSH  Result Value Ref Range   TSH 3.070 0.450 - 4.500 uIU/mL  HgB A1c  Result Value Ref Range   Hgb A1c MFr Bld 8.1 (H) 4.8 - 5.6 %   Est. average glucose Bld gHb Est-mCnc 186 mg/dL  VITAMIN D 25 Hydroxy (Vit-D Deficiency, Fractures)  Result Value Ref Range   Vit D, 25-Hydroxy 36.9 30.0 - 100.0 ng/mL  Vitamin B12  Result Value Ref Range   Vitamin B-12 534 232 - 1,245 pg/mL  POCT urinalysis dipstick  Result Value Ref Range   Color, UA yellow    Clarity, UA clear    Glucose, UA Negative Negative   Bilirubin, UA negative    Ketones, UA negative    Spec Grav, UA 1.010 1.010 -  1.025   Blood, UA negative    pH, UA 5.0 5.0 - 8.0   Protein, UA Negative Negative   Urobilinogen, UA 0.2 0.2 or 1.0 E.U./dL   Nitrite, UA negative    Leukocytes, UA Negative Negative   Appearance     Odor      Assessment & Plan     Encounter for screening mammogram for malignant neoplasm of breast - Plan: MM DIGITAL SCREENING BILATERAL  Screening for blood or protein in urine - Plan: POCT urinalysis dipstick  Vitamin D deficiency - Plan: VITAMIN D 25 Hydroxy (Vit-D  Deficiency, Fractures)  Hyperlipidemia, unspecified hyperlipidemia type - Plan: CBC with Differential/Platelet, Comprehensive metabolic panel, Lipid panel, TSH  Type 2 diabetes mellitus without complication, without long-term current use of insulin (HCC) - Plan: CBC with Differential/Platelet, Comprehensive metabolic panel, TSH, MM DIGITAL SCREENING BILATERAL, HgB A1c  Hypertension associated with diabetes ([place]) - Plan: CBC with Differential/Platelet, Comprehensive metabolic panel  K09 deficiency - Plan: B12  Screening for colon cancer - Plan: Ambulatory referral to Gastroenterology  Meds ordered this encounter  Medications  . lisinopril (ZESTRIL) 10 MG tablet    Sig: Take 1 tablet (10 mg total) by mouth every morning.    Dispense:  90 tablet    Refill:  1    Red Flags discussed. The patient was given clear instructions to go to ER or return to medical center if any red flags develop, symptoms do not improve, worsen or new problems develop. They verbalized understanding.   Return in about 6 months (around 02/11/2021), or if symptoms worsen or fail to improve, for at any time for any worsening symptoms, Go to Emergency room/ urgent care if worse.    The entirety of the information documented in the History of Present Illness, Review of Systems and Physical Exam were personally obtained by me. Portions of this information were initially documented by the CMA and reviewed by me for thoroughness and accuracy.     Marcille Buffy, [place] [phone] (phone) [phone] (fax)  [place]

## 2020-08-12 NOTE — Patient Instructions (Signed)
Call to schedule your screening mammogram. Your orders have been placed for your exam.  Let our office know if you have questions, concerns, or any difficulty scheduling.  If normal results then yearly screening mammograms are recommended unless you notice  Changes in your breast then you should schedule a follow up office visit. If abnormal results  Further imaging will be warranted and sooner follow up as determined by the radiologist at the [facility].   [facility] at [facility] [address], [place] [address]  Main: [phone]    Health Maintenance, Female Adopting a healthy lifestyle and getting preventive care are important in promoting health and wellness. Ask your health care provider about:  The right schedule for you to have regular tests and exams.  Things you can do on your own to prevent diseases and keep yourself healthy. What should I know about diet, weight, and exercise? Eat a healthy diet  Eat a diet that includes plenty of vegetables, fruits, low-fat dairy products, and lean protein.  Do not eat a lot of foods that are high in solid fats, added sugars, or sodium.   Maintain a healthy weight Body mass index (BMI) is used to identify weight problems. It estimates body fat based on height and weight. Your health care provider can help determine your BMI and help you achieve or maintain a healthy weight. Get regular exercise Get regular exercise. This is one of the most important things you can do for your health. Most adults should:  Exercise for at least 150 minutes each week. The exercise should increase your heart rate and make you sweat (moderate-intensity exercise).  Do strengthening exercises at least twice a week. This is in addition to the moderate-intensity exercise.  Spend less time sitting. Even light physical activity can be beneficial. Watch cholesterol and blood lipids Have your blood tested for lipids and  cholesterol at 48 years of age, then have this test every 5 years. Have your cholesterol levels checked more often if:  Your lipid or cholesterol levels are high.  You are older than 48 years of age.  You are at high risk for heart disease. What should I know about cancer screening? Depending on your health history and family history, you may need to have cancer screening at various ages. This may include screening for:  Breast cancer.  Cervical cancer.  Colorectal cancer.  Skin cancer.  Lung cancer. What should I know about heart disease, diabetes, and high blood pressure? Blood pressure and heart disease  High blood pressure causes heart disease and increases the risk of stroke. This is more likely to develop in people who have high blood pressure readings, are of African descent, or are overweight.  Have your blood pressure checked: ? Every 3-5 years if you are 60-83 years of age. ? Every year if you are 68 years old or older. Diabetes Have regular diabetes screenings. This checks your fasting blood sugar level. Have the screening done:  Once every three years after age 54 if you are at a normal weight and have a low risk for diabetes.  More often and at a younger age if you are overweight or have a high risk for diabetes. What should I know about preventing infection? Hepatitis B If you have a higher risk for hepatitis B, you should be screened for this virus. Talk with your health care provider to find out if you are at risk for hepatitis B infection. Hepatitis C  Testing is recommended for:  Everyone born from [birth year] through 1963.  Anyone with known risk factors for hepatitis C. Sexually transmitted infections (STIs)  Get screened for STIs, including gonorrhea and chlamydia, if: ? You are sexually active and are younger than 48 years of age. ? You are older than 48 years of age and your health care provider tells you that you are at risk for this type of  infection. ? Your sexual activity has changed since you were last screened, and you are at increased risk for chlamydia or gonorrhea. Ask your health care provider if you are at risk.  Ask your health care provider about whether you are at high risk for HIV. Your health care provider may recommend a prescription medicine to help prevent HIV infection. If you choose to take medicine to prevent HIV, you should first get tested for HIV. You should then be tested every 3 months for as long as you are taking the medicine. Pregnancy  If you are about to stop having your period (premenopausal) and you may become pregnant, seek counseling before you get pregnant.  Take 400 to 800 micrograms (mcg) of folic acid every day if you become pregnant.  Ask for birth control (contraception) if you want to prevent pregnancy. Osteoporosis and menopause Osteoporosis is a disease in which the bones lose minerals and strength with aging. This can result in bone fractures. If you are 50 years old or older, or if you are at risk for osteoporosis and fractures, ask your health care provider if you should:  Be screened for bone loss.  Take a calcium or vitamin D supplement to lower your risk of fractures.  Be given hormone replacement therapy (HRT) to treat symptoms of menopause. Follow these instructions at home: Lifestyle  Do not use any products that contain nicotine or tobacco, such as cigarettes, e-cigarettes, and chewing tobacco. If you need help quitting, ask your health care provider.  Do not use street drugs.  Do not share needles.  Ask your health care provider for help if you need support or information about quitting drugs. Alcohol use  Do not drink alcohol if: ? Your health care provider tells you not to drink. ? You are pregnant, may be pregnant, or are planning to become pregnant.  If you drink alcohol: ? Limit how much you use to 0-1 drink a day. ? Limit intake if you are  breastfeeding.  Be aware of how much alcohol is in your drink. In the [place] one drink equals one 12 oz bottle of beer (355 mL), one 5 oz glass of wine (148 mL), or one 1 oz glass of hard liquor (44 mL). General instructions  Schedule regular health, dental, and eye exams.  Stay current with your vaccines.  Tell your health care provider if: ? You often feel depressed. ? You have ever been abused or do not feel safe at home. Summary  Adopting a healthy lifestyle and getting preventive care are important in promoting health and wellness.  Follow your health care provider's instructions about healthy diet, exercising, and getting tested or screened for diseases.  Follow your health care provider's instructions on monitoring your cholesterol and blood pressure. This information is not intended to replace advice given to you by your health care provider. Make sure you discuss any questions you have with your health care provider. Document Revised: 04/24/2018 Document Reviewed: 04/24/2018 Elsevier Patient Education  2021 [employer].

## 2020-08-13 LAB — LIPID PANEL
Chol/HDL Ratio: 3.8 ratio (ref 0.0–4.4)
Cholesterol, Total: 103 mg/dL (ref 100–199)
HDL: 27 mg/dL — ABNORMAL LOW (ref >39)
LDL Chol Calc (NIH): 46 mg/dL (ref 0–99)
Triglycerides: 177 mg/dL — ABNORMAL HIGH (ref 0–149)
VLDL Cholesterol Cal: 30 mg/dL (ref 5–40)

## 2020-08-13 LAB — CBC WITH DIFFERENTIAL/PLATELET
Basophils Absolute: 0.1 10*3/uL (ref 0.0–0.2)
Basos: 1 %
EOS (ABSOLUTE): 0.2 10*3/uL (ref 0.0–0.4)
Eos: 2 %
Hematocrit: 36.2 % (ref 34.0–46.6)
Hemoglobin: 12.5 g/dL (ref 11.1–15.9)
Immature Grans (Abs): 0 10*3/uL (ref 0.0–0.1)
Immature Granulocytes: 0 %
Lymphocytes Absolute: 1.7 10*3/uL (ref 0.7–3.1)
Lymphs: 18 %
MCH: 28.6 pg (ref 26.6–33.0)
MCHC: 34.5 g/dL (ref 31.5–35.7)
MCV: 83 fL (ref 79–97)
Monocytes Absolute: 0.7 10*3/uL (ref 0.1–0.9)
Monocytes: 7 %
Neutrophils Absolute: 6.7 10*3/uL (ref 1.4–7.0)
Neutrophils: 72 %
Platelets: 221 10*3/uL (ref 150–450)
RBC: 4.37 x10E6/uL (ref 3.77–5.28)
RDW: 13.4 % (ref 11.7–15.4)
WBC: 9.3 10*3/uL (ref 3.4–10.8)

## 2020-08-13 LAB — COMPREHENSIVE METABOLIC PANEL
ALT: 59 IU/L — ABNORMAL HIGH (ref 0–32)
AST: 63 IU/L — ABNORMAL HIGH (ref 0–40)
Albumin/Globulin Ratio: 1.7 (ref 1.2–2.2)
Albumin: 4.3 g/dL (ref 3.8–4.8)
Alkaline Phosphatase: 107 IU/L (ref 44–121)
BUN/Creatinine Ratio: 11 (ref 9–23)
BUN: 12 mg/dL (ref 6–24)
Bilirubin Total: 0.7 mg/dL (ref 0.0–1.2)
CO2: 21 mmol/L (ref 20–29)
Calcium: 9.5 mg/dL (ref 8.7–10.2)
Chloride: 103 mmol/L (ref 96–106)
Creatinine, Ser: 1.06 mg/dL — ABNORMAL HIGH (ref 0.57–1.00)
Globulin, Total: 2.5 g/dL (ref 1.5–4.5)
Glucose: 126 mg/dL — ABNORMAL HIGH (ref 65–99)
Potassium: 4.4 mmol/L (ref 3.5–5.2)
Sodium: 140 mmol/L (ref 134–144)
Total Protein: 6.8 g/dL (ref 6.0–8.5)
eGFR: 65 mL/min/{1.73_m2} (ref >59)

## 2020-08-13 LAB — TSH: TSH: 3.07 u[IU]/mL (ref 0.450–4.500)

## 2020-08-13 LAB — HEMOGLOBIN A1C
Est. average glucose Bld gHb Est-mCnc: 186 mg/dL
Hgb A1c MFr Bld: 8.1 % — ABNORMAL HIGH (ref 4.8–5.6)

## 2020-08-13 LAB — VITAMIN D 25 HYDROXY (VIT D DEFICIENCY, FRACTURES): Vit D, 25-Hydroxy: 36.9 ng/mL (ref 30.0–100.0)

## 2020-08-13 LAB — VITAMIN B12: Vitamin B-12: 534 pg/mL (ref 232–1245)

## 2020-08-13 NOTE — Progress Notes (Signed)
CBC within normal limits no anemia or signs of infection.  CMP shows elevated glucose at 126, hemoglobin A1c is 8.1 she should continue her current dose of Metformin and schedule follow-up to discuss adding on additional medications for her blood glucose.  Creatinine is 1.06 this is increased slightly and shows decreased kidney function likely due to dehydration or NSAID use avoid any Advil ibuprofen Motrin or Aleve etc. and increase hydration. Liver enzymes are slightly elevated AST and ALT, avoid any excessive alcohol or Tylenol use excessively.  Let us recheck a CMP in 1 month.  Please add a GGT level to those labs in 1 month.  Total cholesterol and LDL elevated.  Discuss lifestyle modification with patient e.g. increase exercise, fiber, fruits, vegetables, lean meat, and omega 3/fish intake and decrease saturated fat.  If patient following strict diet and exercise program already please schedule follow up appointment with primary care physician  TSH is within normal limits for thyroid  Recheck hemoglobin A1c in 3 months and CMP in 1 month.  Please add on lab work.

## 2020-08-17 ENCOUNTER — Other Ambulatory Visit: Payer: Self-pay | Admitting: *Deleted

## 2020-08-17 DIAGNOSIS — R748 Abnormal levels of other serum enzymes: Secondary | ICD-10-CM

## 2020-08-17 DIAGNOSIS — E119 Type 2 diabetes mellitus without complications: Secondary | ICD-10-CM

## 2020-08-18 ENCOUNTER — Other Ambulatory Visit: Payer: Self-pay

## 2020-08-18 ENCOUNTER — Telehealth (INDEPENDENT_AMBULATORY_CARE_PROVIDER_SITE_OTHER): Payer: Self-pay | Admitting: Gastroenterology

## 2020-08-18 DIAGNOSIS — Z1211 Encounter for screening for malignant neoplasm of colon: Secondary | ICD-10-CM

## 2020-08-18 MED ORDER — PEG 3350-KCL-NA BICARB-NACL 420 G PO SOLR: 4000.0000 mL | Freq: Once | ORAL | 0 refills | Status: AC

## 2020-08-18 NOTE — Progress Notes (Signed)
Gastroenterology Pre-Procedure Review  Request Date: Monday 09/27/20 Requesting Physician: Dr. Vicente Males  PATIENT REVIEW QUESTIONS: The patient responded to the following health history questions as indicated:    1. Are you having any GI issues? yes (bloating) 2. Do you have a personal history of Polyps? no 3. Do you have a family history of Colon Cancer or Polyps? yes (mother and uncle colon polyps) 4. Diabetes Mellitus? yes (oral meds) 5. Joint replacements in the past 12 months?no 6. Major health problems in the past 3 months?no 7. Any artificial heart valves, MVP, or defibrillator?no    MEDICATIONS & ALLERGIES:    Patient reports the following regarding taking any anticoagulation/antiplatelet therapy:   Plavix, Coumadin, Eliquis, Xarelto, Lovenox, Pradaxa, Brilinta, or Effient? no Aspirin? no  Patient confirms/reports the following medications:  Current Outpatient Medications  Medication Sig Dispense Refill  . Ascorbic Acid (VITAMIN C PO) Take by mouth.    . cholecalciferol (VITAMIN D3) 25 MCG (1000 UNIT) tablet Take 1,000 Units by mouth daily.    . cyanocobalamin (,VITAMIN B-12,) 1000 MCG/ML injection 1000 mcg (1 mg) injection once per week for four weeks, followed by 1000 mcg injection once per month. (Patient taking differently: Inject 1,000 mcg into the muscle every 30 (thirty) days.) 1 mL 15  . docusate sodium (COLACE) 100 MG capsule Take 100 mg by mouth 2 (two) times daily as needed for mild constipation.    . gabapentin (NEURONTIN) 100 MG capsule Take 200 mg by mouth 2 (two) times daily.    Marland Kitchen lisinopril (ZESTRIL) 10 MG tablet Take 1 tablet (10 mg total) by mouth every morning. 90 tablet 1  . MAGNESIUM OXIDE PO Take by mouth.    . metFORMIN (GLUCOPHAGE) 500 MG tablet Take 2 tablets (1,000 mg total) by mouth 2 (two) times daily with a meal. 120 tablet 2  . methocarbamol (ROBAXIN) 750 MG tablet Take 1 tablet (750 mg total) by mouth every 8 (eight) hours as needed for muscle spasms.  90 tablet 1  . pregabalin (LYRICA) 75 MG capsule Take by mouth.    . Pyridoxine HCl (VITAMIN B6 PO) Take by mouth.    . rosuvastatin (CRESTOR) 10 MG tablet Take 1 tablet (10 mg total) by mouth daily. 90 tablet 3   No current facility-administered medications for this visit.    Patient confirms/reports the following allergies:  Allergies  Allergen Reactions  . Codeine Nausea And Vomiting    No orders of the defined types were placed in this encounter.   AUTHORIZATION INFORMATION Primary Insurance: 1D#: Group #:  Secondary Insurance: 1D#: Group #:  SCHEDULE INFORMATION: Date: 09/27/20 Time: Location:[facility]

## 2020-08-20 ENCOUNTER — Other Ambulatory Visit: Payer: Self-pay | Admitting: Internal Medicine

## 2020-09-02 ENCOUNTER — Encounter: Payer: Self-pay | Admitting: Pulmonary Disease

## 2020-09-02 NOTE — Progress Notes (Signed)
Subjective:    Patient ID: Sherry Hobbs, female    DOB: 1970-08-24, 50 y.o.   MRN: [id number]  HPI The patient is a 50 year old lifelong never smoker who presents for evaluation of an incidental finding on CT chest.  She is kindly referred by Denice Paradise, NP.  Patient is Sherry Peace, FNP.  The patient had a CT scan for coronary calcium score purposes in August 2021.  This showed a 3 mm perifissural nodule on the right middle lobe.  This was unchanged when compared to a CT of the abdomen and pelvis from 21 November 2019.  She has not had any cough or sputum production.  No hemoptysis.  She does endorse gastroesophageal reflux symptoms and has frequent heartburn.  She also had been noting some shortness of breath when climbing stairs and walking a long distance but she states that this is actually getting better and not as noticeable.  She has not had any fevers, chills or sweats.  No anorexia or weight loss.  No chest pain, orthopnea, paroxysmal nocturnal dyspnea or calf tenderness.  She has had occasional lower extremity edema.  Have grade 1 diastolic dysfunction by echo as well as mild MR.  She has noted that since her blood pressure is better controlled she feels her shortness of breath is better.  She has no occupational exposure history.  Has lived in [place] all her life.  No travel outside of the country.  No exposure to tuberculosis.  No military history.  Review of Systems A 10 point review of systems was performed and it is as noted above otherwise negative. Past Medical History:  Diagnosis Date  . Anemia    problems when having abnmornal uterine bleeding.  . Arthritis    lower back  . Depression   . Diabetes mellitus without complication ([place])   . DM (diabetes mellitus) ([place]) 08/07/2019  . Dysuria 12/01/2019  . Elevated serum creatinine   . GERD (gastroesophageal reflux disease)    OCC  NO MEDS  . Headache    occasional migraines  . History of blood transfusion  05/02/2016   due to abnormal uterine bleeding  . HTN (hypertension) 08/07/2019  . Hypertension   . Neuropathy    feet   Past Surgical History:  Procedure Laterality Date  . ABDOMINAL HYSTERECTOMY    . BACK SURGERY    . DILATION AND CURETTAGE OF UTERUS  05/03/2016   Procedure: DILATATION AND CURETTAGE;  Surgeon: [name], MD;  Location: [facility] ORS;  Service: Gynecology;;  . Murrell Redden & CURRETTAGE/HYSTROSCOPY WITH NOVASURE ABLATION N/A 12/03/2017   Procedure: DILATATION & CURETTAGE/HYSTEROSCOPY WITH MINERVA ABLATION;  Surgeon: [name], MD;  Location: [facility] ORS;  Service: Gynecology;  Laterality: N/A;  MINERVA REP CONTACTED [facility] [phone])  . LAPAROSCOPIC ASSISTED VAGINAL HYSTERECTOMY N/A 07/07/2019   Procedure: LAPAROSCOPIC ASSISTED VAGINAL HYSTERECTOMY;  Surgeon: [name], MD;  Location: [facility] ORS;  Service: Gynecology;  Laterality: N/A;  possible TVH  . LAPAROSCOPIC BILATERAL SALPINGECTOMY Bilateral 07/07/2019   Procedure: LAPAROSCOPIC BILATERAL SALPINGECTOMY;  Surgeon: [name], MD;  Location: [facility] ORS;  Service: Gynecology;  Laterality: Bilateral;  . LUMBAR LAMINECTOMY/DECOMPRESSION MICRODISCECTOMY Left 06/24/2018   Procedure: LUMBAR HEMILAMINECTOMY, FORAMINOTOMY, CYST REMOVAL - 1 LEVEL LEFT L4-5;  Surgeon: [name], MD;  Location: [facility] ORS;  Service: Neurosurgery;  Laterality: Left;   Family History  Problem Relation Age of Onset  . Diabetes Mother   . COPD Mother   . Emphysema Mother   . Osteoporosis Mother   .  Lung cancer Mother 65  . Breast cancer Mother   . CAD Father   . Diabetes Sister   . Diabetes Brother   . CAD Brother   . Celiac disease Brother   . Renal cancer Maternal Uncle        1 uncle  . Brain cancer Maternal Uncle        different uncle  . Diabetes Maternal Grandmother   . Stroke Maternal Grandmother   . Bladder Cancer Maternal Grandfather   . Diabetes Brother   . Heart disease Brother   . Cancer Sister   . Nevi Sister     Social History   Tobacco Use  . Smoking status: Never Smoker  . Smokeless tobacco: Never Used  Substance Use Topics  . Alcohol use: Yes    Comment: occassional   Allergies  Allergen Reactions  . Codeine Nausea And Vomiting   Current medications have been reviewed.  Immunization History  Administered Date(s) Administered  . Hepatitis B 01/13/1992, 02/10/1992, 07/12/1992  . Influenza-Unspecified 03/04/2020  . PFIZER(Purple Top)SARS-COV-2 Vaccination 05/20/2019, 06/10/2019  . Td 02/10/1992, 10/28/2010  . Tdap 08/07/2019       Objective:   Physical Exam BP 120/72 (BP Location: Left Arm, Patient Position: Sitting, Cuff Size: Normal)   Pulse 72   Ht 5\' 2"  (1.575 m)   Wt 190 lb 3.2 oz (86.3 kg)   LMP 11/26/2017   SpO2 98%   BMI 34.79 kg/m  GENERAL: Overweight woman, no acute distress, fully ambulatory. HEAD: Normocephalic, atraumatic.  EYES: Pupils equal, round, reactive to light.  No scleral icterus.  MOUTH: Nose/mouth/throat not examined due to masking requirements for COVID 19. NECK: Supple. No thyromegaly. Trachea midline. No JVD.  No adenopathy. PULMONARY: Good air entry bilaterally.  No adventitious sounds. CARDIOVASCULAR: S1 and S2. Regular rate and rhythm.  No rubs, murmurs or gallops heard. ABDOMEN: Benign. MUSCULOSKELETAL: No joint deformity, no clubbing, no edema.  NEUROLOGIC: No focal deficit, no gait disturbance, speech is fluent. SKIN: Intact,warm,dry. PSYCH: Mood and behavior normal.  Representative slides from CT performed 25 December 2019 independently reviewed:     Assessment & Plan:     ICD-10-CM   1. Lung nodule < 6cm on CT  R91.1 CT CHEST WO CONTRAST   Very small 3 mm nodule Patient is low risk Repeat dedicated CT chest 1 year  2. DOE (dyspnea on exertion)  R06.00    Relatively new symptom States actually getting better Attributes to deconditioning   . Orders Placed This Encounter  Procedures  . CT CHEST WO CONTRAST    Standing  Status:   Future    Standing Expiration Date:   03/25/2021    Scheduling Instructions:     12/2020    Order Specific Question:   Is patient pregnant?    Answer:   No    Order Specific Question:   Preferred imaging location?    Answer:   [facility]   We will see the patient in a years time after follow-up CT chest.  She is however to contact us prior to that time if her symptoms of dyspnea recur or become more noticeable.  Renold Don, MD [facility]   *This note was dictated using voice recognition software/Dragon.  Despite best efforts to proofread, errors can occur which can change the meaning.  Any change was purely unintentional.

## 2020-09-27 ENCOUNTER — Encounter: Payer: Self-pay | Admitting: Gastroenterology

## 2020-09-27 ENCOUNTER — Other Ambulatory Visit: Payer: Self-pay

## 2020-09-27 ENCOUNTER — Ambulatory Visit: Payer: Managed Care, Other (non HMO) | Admitting: Anesthesiology

## 2020-09-27 ENCOUNTER — Ambulatory Visit
Admission: RE | Admit: 2020-09-27 | Discharge: 2020-09-27 | Disposition: A | Discharge status: Home / Self Care | Payer: Managed Care, Other (non HMO) | Source: Ambulatory Visit | Attending: Gastroenterology | Admitting: Gastroenterology

## 2020-09-27 ENCOUNTER — Encounter
Admission: RE | Disposition: A | Discharge status: Home / Self Care | Payer: Self-pay | Source: Ambulatory Visit | Attending: Gastroenterology

## 2020-09-27 DIAGNOSIS — Z8379 Family history of other diseases of the digestive system: Secondary | ICD-10-CM | POA: Insufficient documentation

## 2020-09-27 DIAGNOSIS — Z823 Family history of stroke: Secondary | ICD-10-CM | POA: Insufficient documentation

## 2020-09-27 DIAGNOSIS — Z1211 Encounter for screening for malignant neoplasm of colon: Secondary | ICD-10-CM | POA: Insufficient documentation

## 2020-09-27 DIAGNOSIS — Z808 Family history of malignant neoplasm of other organs or systems: Secondary | ICD-10-CM | POA: Insufficient documentation

## 2020-09-27 DIAGNOSIS — I10 Essential (primary) hypertension: Secondary | ICD-10-CM | POA: Diagnosis not present

## 2020-09-27 DIAGNOSIS — Z8052 Family history of malignant neoplasm of bladder: Secondary | ICD-10-CM | POA: Insufficient documentation

## 2020-09-27 DIAGNOSIS — Z8262 Family history of osteoporosis: Secondary | ICD-10-CM | POA: Insufficient documentation

## 2020-09-27 DIAGNOSIS — Z801 Family history of malignant neoplasm of trachea, bronchus and lung: Secondary | ICD-10-CM | POA: Diagnosis not present

## 2020-09-27 DIAGNOSIS — Z8249 Family history of ischemic heart disease and other diseases of the circulatory system: Secondary | ICD-10-CM | POA: Diagnosis not present

## 2020-09-27 DIAGNOSIS — Z8371 Family history of colonic polyps: Secondary | ICD-10-CM | POA: Insufficient documentation

## 2020-09-27 DIAGNOSIS — Z833 Family history of diabetes mellitus: Secondary | ICD-10-CM | POA: Insufficient documentation

## 2020-09-27 DIAGNOSIS — Z8051 Family history of malignant neoplasm of kidney: Secondary | ICD-10-CM | POA: Insufficient documentation

## 2020-09-27 DIAGNOSIS — Z809 Family history of malignant neoplasm, unspecified: Secondary | ICD-10-CM | POA: Diagnosis not present

## 2020-09-27 DIAGNOSIS — E1142 Type 2 diabetes mellitus with diabetic polyneuropathy: Secondary | ICD-10-CM | POA: Diagnosis not present

## 2020-09-27 DIAGNOSIS — Z803 Family history of malignant neoplasm of breast: Secondary | ICD-10-CM | POA: Insufficient documentation

## 2020-09-27 DIAGNOSIS — K219 Gastro-esophageal reflux disease without esophagitis: Secondary | ICD-10-CM | POA: Diagnosis not present

## 2020-09-27 DIAGNOSIS — Z885 Allergy status to narcotic agent status: Secondary | ICD-10-CM | POA: Diagnosis not present

## 2020-09-27 DIAGNOSIS — Z7984 Long term (current) use of oral hypoglycemic drugs: Secondary | ICD-10-CM | POA: Insufficient documentation

## 2020-09-27 DIAGNOSIS — Z825 Family history of asthma and other chronic lower respiratory diseases: Secondary | ICD-10-CM | POA: Diagnosis not present

## 2020-09-27 DIAGNOSIS — Z79899 Other long term (current) drug therapy: Secondary | ICD-10-CM | POA: Insufficient documentation

## 2020-09-27 DIAGNOSIS — D123 Benign neoplasm of transverse colon: Secondary | ICD-10-CM | POA: Diagnosis not present

## 2020-09-27 HISTORY — PX: COLONOSCOPY WITH PROPOFOL: SHX5780

## 2020-09-27 LAB — GLUCOSE, CAPILLARY: Glucose-Capillary: 168 mg/dL — ABNORMAL HIGH (ref 70–99)

## 2020-09-27 SURGERY — COLONOSCOPY WITH PROPOFOL
Anesthesia: General

## 2020-09-27 MED ORDER — LIDOCAINE HCL (CARDIAC) PF 100 MG/5ML IV SOSY
PREFILLED_SYRINGE | INTRAVENOUS | Status: DC | PRN
  Administered 2020-09-27: 40 mg via INTRAVENOUS

## 2020-09-27 MED ORDER — SODIUM CHLORIDE 0.9 % IV SOLN: INTRAVENOUS | Status: DC

## 2020-09-27 MED ORDER — PROPOFOL 10 MG/ML IV BOLUS
INTRAVENOUS | Status: DC | PRN
  Administered 2020-09-27: 100 mg via INTRAVENOUS

## 2020-09-27 MED ORDER — LIDOCAINE HCL (PF) 2 % IJ SOLN
INTRAMUSCULAR | Status: AC
  Filled 2020-09-27: qty 2

## 2020-09-27 MED ORDER — PROPOFOL 500 MG/50ML IV EMUL
INTRAVENOUS | Status: DC | PRN
  Administered 2020-09-27: 120 ug/kg/min via INTRAVENOUS

## 2020-09-27 MED ORDER — PROPOFOL 500 MG/50ML IV EMUL
INTRAVENOUS | Status: AC
  Filled 2020-09-27: qty 50

## 2020-09-27 NOTE — Anesthesia Preprocedure Evaluation (Signed)
Anesthesia Evaluation  Patient identified by MRN, date of birth, ID band Patient awake    Reviewed: Allergy & Precautions, NPO status , Patient's Chart, lab work & pertinent test results  History of Anesthesia Complications Negative for: history of anesthetic complications  Airway Mallampati: II  TM Distance: >3 FB Neck ROM: Full    Dental  (+) Poor Dentition, Dental Advidsory Given,    Pulmonary neg pulmonary ROS, neg shortness of breath, neg sleep apnea, neg COPD, neg recent URI,    breath sounds clear to auscultation- rhonchi (-) wheezing      Cardiovascular hypertension, Pt. on medications (-) angina(-) CAD, (-) Past MI, (-) Cardiac Stents and (-) CABG (-) dysrhythmias (-) Valvular Problems/Murmurs Rhythm:Regular Rate:Normal - Systolic murmurs and - Diastolic murmurs    Neuro/Psych  Headaches, neg Seizures PSYCHIATRIC DISORDERS Depression    GI/Hepatic Neg liver ROS, GERD  ,  Endo/Other  diabetes, Oral Hypoglycemic Agents  Renal/GU negative Renal ROS     Musculoskeletal  (+) Arthritis ,   Abdominal (+) + obese,   Peds  Hematology  (+) Blood dyscrasia, anemia ,   Anesthesia Other Findings Past Medical History: No date: Anemia     Comment:  problems when having abnmornal uterine bleeding. No date: Arthritis     Comment:  lower back No date: Depression No date: Diabetes mellitus without complication ([facility]) No date: Elevated serum creatinine No date: GERD (gastroesophageal reflux disease)     Comment:  OCC  NO MEDS No date: Headache     Comment:  occasional migraines 05/02/2016: History of blood transfusion     Comment:  due to abnormal uterine bleeding No date: Hypertension No date: Neuropathy     Comment:  feet   Reproductive/Obstetrics                             Anesthesia Physical  Anesthesia Plan  ASA: III  Anesthesia Plan: General   Post-op Pain Management:     Induction: Intravenous  PONV Risk Score and Plan: 3 and TIVA and Propofol infusion  Airway Management Planned: Natural Airway and Nasal Cannula  Additional Equipment:   Intra-op Plan:   Post-operative Plan: Extubation in OR  Informed Consent: I have reviewed the patients History and Physical, chart, labs and discussed the procedure including the risks, benefits and alternatives for the proposed anesthesia with the patient or authorized representative who has indicated his/her understanding and acceptance.     Dental advisory given  Plan Discussed with: CRNA and Anesthesiologist  Anesthesia Plan Comments:         Anesthesia Quick Evaluation

## 2020-09-27 NOTE — Anesthesia Postprocedure Evaluation (Signed)
Anesthesia Post Note  Patient: Sherry Hobbs  Procedure(s) Performed: COLONOSCOPY WITH PROPOFOL (N/A )  Patient location during evaluation: Endoscopy Anesthesia Type: General Level of consciousness: awake and alert Pain management: pain level controlled Vital Signs Assessment: post-procedure vital signs reviewed and stable Respiratory status: spontaneous breathing, nonlabored ventilation, respiratory function stable and patient connected to nasal cannula oxygen Cardiovascular status: blood pressure returned to baseline and stable Postop Assessment: no apparent nausea or vomiting Anesthetic complications: no   No complications documented.   Last Vitals:  Vitals:   09/27/20 0902 09/27/20 0912  BP: (!) 152/80 (!) 164/83  Pulse: 65 64  Resp: 20 (!) 22  Temp:    SpO2: 99% 100%    Last Pain:  Vitals:   09/27/20 0912  TempSrc:   PainSc: 0-No pain                 Martha Clan

## 2020-09-27 NOTE — Op Note (Signed)
[facility] Gastroenterology Patient Name: Sherry Hobbs Procedure Date: 09/27/2020 8:17 AM MRN: [id number] Account #: [id number] Date of Birth: 08-11-1970 Admit Type: Outpatient Age: 50 Room: [facility] ENDO ROOM 4 Gender: Female Note Status: Finalized Procedure:             Colonoscopy Indications:           Colon cancer screening in patient at increased risk:                         Family history of 1st-degree relative with colon                         polyps before age 37 years Providers:             Jonathon Bellows MD, MD Referring MD:          Kelby Aline. Flinchum (Referring MD) Medicines:             Monitored Anesthesia Care Complications:         No immediate complications. Procedure:             Pre-Anesthesia Assessment:                        - Prior to the procedure, a History and Physical was                         performed, and patient medications, allergies and                         sensitivities were reviewed. The patient's tolerance                         of previous anesthesia was reviewed.                        - The risks and benefits of the procedure and the                         sedation options and risks were discussed with the                         patient. All questions were answered and informed                         consent was obtained.                        - ASA Grade Assessment: II - A patient with mild                         systemic disease.                        After obtaining informed consent, the colonoscope was                         passed under direct vision. Throughout the procedure,                         the patient's blood pressure,  pulse, and oxygen                         saturations were monitored continuously. The                         Colonoscope was introduced through the anus and                         advanced to the the cecum, identified by the                         appendiceal orifice. The  colonoscopy was performed                         with ease. The patient tolerated the procedure well.                         The quality of the bowel preparation was adequate. Findings:      The perianal and digital rectal examinations were normal.      A 8 mm polyp was found in the transverse colon. The polyp was sessile.       The polyp was removed with a cold snare. Resection and retrieval were       complete.      The exam was otherwise without abnormality on direct and retroflexion       views. Impression:            - One 8 mm polyp in the transverse colon, removed with                         a cold snare. Resected and retrieved.                        - The examination was otherwise normal on direct and                         retroflexion views. Recommendation:        - Discharge patient to home (with escort).                        - Resume previous diet.                        - Continue present medications.                        - Await pathology results.                        - Repeat colonoscopy for surveillance based on                         pathology results. Procedure Code(s):     --- Professional ---                        [phone], Colonoscopy, flexible; with removal of                         tumor(s), polyp(s), or other lesion(s)  by snare                         technique Diagnosis Code(s):     --- Professional ---                        Z83.71, Family history of colonic polyps                        K63.5, Polyp of colon CPT copyright 2019 American Medical Association. All rights reserved. The codes documented in this report are preliminary and upon coder review may  be revised to meet current compliance requirements. Jonathon Bellows, MD Jonathon Bellows MD, MD 09/27/2020 8:40:37 AM This report has been signed electronically. Number of Addenda: 0 Note Initiated On: 09/27/2020 8:17 AM Scope Withdrawal Time: 0 hours 11 minutes 32 seconds  Total Procedure Duration: 0  hours 14 minutes 42 seconds  Estimated Blood Loss:  Estimated blood loss: none.      [facility]

## 2020-09-27 NOTE — Transfer of Care (Signed)
Immediate Anesthesia Transfer of Care Note  Patient: Sherry Hobbs  Procedure(s) Performed: COLONOSCOPY WITH PROPOFOL (N/A )  Patient Location: PACU and Endoscopy Unit  Anesthesia Type:General  Level of Consciousness: drowsy and patient cooperative  Airway & Oxygen Therapy: Patient Spontanous Breathing  Post-op Assessment: Report given to RN and Post -op Vital signs reviewed and stable  Post vital signs: Reviewed and stable  Last Vitals:  Vitals Value Taken Time  BP 116/73 09/27/20 0842  Temp 35.7 C 09/27/20 0842  Pulse 70 09/27/20 0842  Resp 23 09/27/20 0842  SpO2 98 % 09/27/20 0842  Vitals shown include unvalidated device data.  Last Pain:  Vitals:   09/27/20 0842  TempSrc: Tympanic  PainSc: Asleep         Complications: No complications documented.

## 2020-09-27 NOTE — H&P (Signed)
Jonathon Bellows, MD [address], [place], [place], [place], [address] [address], [place], [place], [place], [address] Phone: [phone]  Fax: [phone]  Primary Care Physician:  Doreen Beam, FNP   Pre-Procedure History & Physical: HPI:  Sherry Hobbs is a 50 y.o. female is here for an colonoscopy.   Past Medical History:  Diagnosis Date  . Anemia    problems when having abnmornal uterine bleeding.  . Arthritis    lower back  . Depression   . Diabetes mellitus without complication ([place])   . DM (diabetes mellitus) ([place]) 08/07/2019  . Dysuria 12/01/2019  . Elevated serum creatinine   . GERD (gastroesophageal reflux disease)    OCC  NO MEDS  . Headache    occasional migraines  . History of blood transfusion 05/02/2016   due to abnormal uterine bleeding  . HTN (hypertension) 08/07/2019  . Hypertension   . Neuropathy    feet    Past Surgical History:  Procedure Laterality Date  . ABDOMINAL HYSTERECTOMY    . BACK SURGERY    . DILATION AND CURETTAGE OF UTERUS  05/03/2016   Procedure: DILATATION AND CURETTAGE;  Surgeon: [name], MD;  Location: [facility] ORS;  Service: Gynecology;;  . Murrell Redden & CURRETTAGE/HYSTROSCOPY WITH NOVASURE ABLATION N/A 12/03/2017   Procedure: DILATATION & CURETTAGE/HYSTEROSCOPY WITH MINERVA ABLATION;  Surgeon: [name], MD;  Location: [facility] OR[facility];  Service: Gynecology;  Laterality: N/A;  MINERVA REP CONTACTED [facility] VU[facility] [phone])  . LAPAROSCOPIC ASSISTED VAGINAL HYSTERECTOMY N/A 07/07/2019   Procedure: LAPAROSCOPIC ASSISTED VAGINAL HYSTERECTOMY;  Surgeon: [name], MD;  Location: [facility] ORS;  Service: Gynecology;  Laterality: N/A;  possible TVH  . LAPAROSCOPIC BILATERAL SALPINGECTOMY Bilateral 07/07/2019   Procedure: LAPAROSCOPIC BILATERAL SALPINGECTOMY;  Surgeon: [name], MD;  Location: [facility] ORS;  Service: Gynecology;  Laterality: Bilateral;  . LUMBAR LAMINECTOMY/DECOMPRESSION MICRODISCECTOMY Left 06/24/2018    Procedure: LUMBAR HEMILAMINECTOMY, FORAMINOTOMY, CYST REMOVAL - 1 LEVEL LEFT L4-5;  Surgeon: [name], MD;  Location: [facility] ORS;  Service: Neurosurgery;  Laterality: Left;    Prior to Admission medications   Medication Sig Start Date End Date Taking? Authorizing Provider  Ascorbic Acid (VITAMIN C PO) Take by mouth.   Yes [provider]  cholecalciferol (VITAMIN D3) 25 MCG (1000 UNIT) tablet Take 1,000 Units by mouth daily.   Yes [provider]  cyanocobalamin (,VITAMIN B-12,) 1000 MCG/ML injection 1000 mcg (1 mg) injection once per week for four weeks, followed by 1000 mcg injection once per month. Patient taking differently: Inject 1,000 mcg into the muscle every 30 (thirty) days. 08/07/19  Yes Marval Regal, NP  docusate sodium (COLACE) 100 MG capsule Take 100 mg by mouth 2 (two) times daily as needed for mild constipation.   Yes [provider]  lisinopril (ZESTRIL) 10 MG tablet Take 1 tablet (10 mg total) by mouth every morning. 08/12/20  Yes Flinchum, Kelby Aline, FNP  MAGNESIUM OXIDE PO Take by mouth.   Yes [provider]  metFORMIN (GLUCOPHAGE) 500 MG tablet TAKE 2 TABLETS (1,000 MG TOTAL) BY MOUTH 2 (TWO) TIMES DAILY WITH A MEAL. 08/20/20  Yes Crecencio Mc, MD  methocarbamol (ROBAXIN) 750 MG tablet Take 1 tablet (750 mg total) by mouth every 8 (eight) hours as needed for muscle spasms. 03/03/19  Yes Gillis Santa, MD  pregabalin (LYRICA) 75 MG capsule Take 75 mg by mouth 3 (three) times daily.   Yes [provider]  rosuvastatin (CRESTOR) 10 MG tablet  Take 1 tablet (10 mg total) by mouth daily. 01/14/20  Yes Marval Regal, NP  gabapentin (NEURONTIN) 100 MG capsule Take 200 mg by mouth 2 (two) times daily. Patient not taking: Reported on 09/27/2020 07/29/20   [provider]    Allergies as of 08/19/2020 - Review Complete 08/18/2020  Allergen Reaction Noted  . Codeine Nausea And Vomiting 10/04/2014    Family History   Problem Relation Age of Onset  . Diabetes Mother   . COPD Mother   . Emphysema Mother   . Osteoporosis Mother   . Lung cancer Mother 41  . Breast cancer Mother   . CAD Father   . Diabetes Sister   . Diabetes Brother   . CAD Brother   . Celiac disease Brother   . Renal cancer Maternal Uncle        1 uncle  . Brain cancer Maternal Uncle        different uncle  . Diabetes Maternal Grandmother   . Stroke Maternal Grandmother   . Bladder Cancer Maternal Grandfather   . Diabetes Brother   . Heart disease Brother   . Cancer Sister   . Nevi Sister     Social History   Socioeconomic History  . Marital status: Married    Spouse name: Gwyndolyn Saxon  . Number of children: Not on file  . Years of education: Not on file  . Highest education level: Not on file  Occupational History  . Occupation: Solicitor  Tobacco Use  . Smoking status: Never Smoker  . Smokeless tobacco: Never Used  Vaping Use  . Vaping Use: Never used  Substance and Sexual Activity  . Alcohol use: Yes    Comment: occassional  . Drug use: No  . Sexual activity: Not Currently    Birth control/protection: None  Other Topics Concern  . Not on file  Social History Narrative  . Not on file   Social Determinants of Health   Financial Resource Strain: Not on file  Food Insecurity: Not on file  Transportation Needs: Not on file  Physical Activity: Not on file  Stress: Not on file  Social Connections: Not on file  Intimate Partner Violence: Not on file    Review of Systems: See HPI, otherwise negative ROS  Physical Exam: BP (!) 159/97   Pulse 65   Temp (!) 96.3 F (35.7 C) (Temporal)   Resp 18   Ht 5\' 3"  (1.6 m)   Wt 83 kg   LMP 11/26/2017   SpO2 99%   BMI 32.42 kg/m  General:   Alert,  pleasant and cooperative in NAD Head:  Normocephalic and atraumatic. Neck:  Supple; no masses or thyromegaly. Lungs:  Clear throughout to auscultation, normal respiratory effort.    Heart:  +S1,  +S2, Regular rate and rhythm, No edema. Abdomen:  Soft, nontender and nondistended. Normal bowel sounds, without guarding, and without rebound.   Neurologic:  Alert and  oriented x4;  grossly normal neurologically.  Impression/Plan: Sherry Hobbs is here for an colonoscopy to be performed for Screening colonoscopy family history of colon polyps Risks, benefits, limitations, and alternatives regarding  colonoscopy have been reviewed with the patient.  Questions have been answered.  All parties agreeable.   Jonathon Bellows, MD  09/27/2020, 8:12 AM

## 2020-09-28 LAB — SURGICAL PATHOLOGY

## 2020-09-30 ENCOUNTER — Encounter: Payer: Self-pay | Admitting: Gastroenterology

## 2020-11-01 ENCOUNTER — Other Ambulatory Visit: Payer: Self-pay | Admitting: Adult Health

## 2020-11-01 DIAGNOSIS — Z1231 Encounter for screening mammogram for malignant neoplasm of breast: Secondary | ICD-10-CM

## 2020-11-05 ENCOUNTER — Ambulatory Visit
Admission: RE | Admit: 2020-11-05 | Discharge: 2020-11-05 | Disposition: A | Discharge status: Home / Self Care | Payer: Managed Care, Other (non HMO) | Source: Ambulatory Visit | Attending: Adult Health | Admitting: Adult Health

## 2020-11-05 ENCOUNTER — Other Ambulatory Visit: Payer: Self-pay

## 2020-11-05 DIAGNOSIS — Z1231 Encounter for screening mammogram for malignant neoplasm of breast: Secondary | ICD-10-CM | POA: Diagnosis not present

## 2020-11-29 ENCOUNTER — Other Ambulatory Visit: Payer: Self-pay | Admitting: Internal Medicine

## 2020-11-30 ENCOUNTER — Encounter: Payer: Self-pay | Admitting: Adult Health

## 2020-12-01 NOTE — Telephone Encounter (Signed)
Pt appt scheduled . Pt will check her work schedule to make sure that date is okay. If not she will cancel and reschedule. Just an [place]

## 2020-12-02 NOTE — Telephone Encounter (Signed)
Patient informed, Due to the high volume of calls and your symptoms we have to forward your call to our Triage Nurse to expedient your call. Please hold for the transfer.  Patient transferred to [facility]. Due to swelling in feet and blood pressure today is 185/100. I let her know that we have no appts avail in office or virtually. Pt already has an appt scheduled with Dr Caryl Bis tomorrow but pt wanted to speak with nurse today.

## 2020-12-03 ENCOUNTER — Ambulatory Visit (INDEPENDENT_AMBULATORY_CARE_PROVIDER_SITE_OTHER): Payer: Managed Care, Other (non HMO) | Admitting: Family Medicine

## 2020-12-03 ENCOUNTER — Other Ambulatory Visit: Payer: Self-pay

## 2020-12-03 VITALS — BP 130/80 | HR 81 | Temp 98.1°F | Ht 62.0 in | Wt 189.4 lb

## 2020-12-03 DIAGNOSIS — R002 Palpitations: Secondary | ICD-10-CM

## 2020-12-03 DIAGNOSIS — R600 Localized edema: Secondary | ICD-10-CM

## 2020-12-03 DIAGNOSIS — I10 Essential (primary) hypertension: Secondary | ICD-10-CM

## 2020-12-03 LAB — COMPREHENSIVE METABOLIC PANEL
ALT: 32 U/L (ref 0–35)
AST: 28 U/L (ref 0–37)
Albumin: 4 g/dL (ref 3.5–5.2)
Alkaline Phosphatase: 88 U/L (ref 39–117)
BUN: 14 mg/dL (ref 6–23)
CO2: 29 mEq/L (ref 19–32)
Calcium: 9.4 mg/dL (ref 8.4–10.5)
Chloride: 102 mEq/L (ref 96–112)
Creatinine, Ser: 1.02 mg/dL (ref 0.40–1.20)
GFR: 65.46 mL/min (ref >60.00)
Glucose, Bld: 120 mg/dL — ABNORMAL HIGH (ref 70–99)
Potassium: 4.1 mEq/L (ref 3.5–5.1)
Sodium: 139 mEq/L (ref 135–145)
Total Bilirubin: 1 mg/dL (ref 0.2–1.2)
Total Protein: 6.4 g/dL (ref 6.0–8.3)

## 2020-12-03 LAB — CBC
HCT: 36.1 % (ref 36.0–46.0)
Hemoglobin: 12.2 g/dL (ref 12.0–15.0)
MCHC: 33.9 g/dL (ref 30.0–36.0)
MCV: 82.8 fl (ref 78.0–100.0)
Platelets: 211 10*3/uL (ref 150.0–400.0)
RBC: 4.36 Mil/uL (ref 3.87–5.11)
RDW: 13.7 % (ref 11.5–15.5)
WBC: 10.1 10*3/uL (ref 4.0–10.5)

## 2020-12-03 LAB — BRAIN NATRIURETIC PEPTIDE: Pro B Natriuretic peptide (BNP): 33 pg/mL (ref 0.0–100.0)

## 2020-12-03 LAB — TSH: TSH: 2.56 u[IU]/mL (ref 0.35–5.50)

## 2020-12-03 NOTE — Telephone Encounter (Signed)
Pt is being evaluated on 12/03/20 by Dr. Caryl Bis.

## 2020-12-03 NOTE — Assessment & Plan Note (Addendum)
Undetermined cause though certainly the Lyrica could be playing a role given the time course of dose change in her symptom onset.  EKG performed today and is reassuring.  Lab work as outlined.

## 2020-12-03 NOTE — Assessment & Plan Note (Signed)
New onset issue.  This may be related to the Lyrica though we will obtain lab work to evaluate for other causes.  If the lab work is unremarkable she may need to decrease her dose of Lyrica.

## 2020-12-03 NOTE — Progress Notes (Signed)
Tommi Rumps, MD Phone: [phone]  Sherry Hobbs is a 50 y.o. female who presents today for same day visit.  HTN/edema: Patient notes 3 days ago she developed swelling in her feet. Worse by the end of the day.  She notes her blood pressure trended up as well and has ranged from 152-185/92-100.  She notes no chest pain.  She may be slightly more short of breath with exertion than her baseline.  She does feel some palpitations as well.  She did have some headache with her blood pressure being elevated.  She notes no orthopnea or PND.  She does report a history of iron deficiency.  Her Lyrica dose was recently increased.  Social History   Tobacco Use  Smoking Status Never  Smokeless Tobacco Never    Current Outpatient Medications on File Prior to Visit  Medication Sig Dispense Refill   Ascorbic Acid (VITAMIN C PO) Take by mouth.     cholecalciferol (VITAMIN D3) 25 MCG (1000 UNIT) tablet Take 1,000 Units by mouth daily.     cyanocobalamin (,VITAMIN B-12,) 1000 MCG/ML injection 1000 mcg (1 mg) injection once per week for four weeks, followed by 1000 mcg injection once per month. (Patient taking differently: Inject 1,000 mcg into the muscle every 30 (thirty) days.) 1 mL 15   docusate sodium (COLACE) 100 MG capsule Take 100 mg by mouth 2 (two) times daily as needed for mild constipation.     gabapentin (NEURONTIN) 100 MG capsule Take 200 mg by mouth 2 (two) times daily.     lisinopril (ZESTRIL) 10 MG tablet Take 1 tablet (10 mg total) by mouth every morning. 90 tablet 1   MAGNESIUM OXIDE PO Take by mouth.     metFORMIN (GLUCOPHAGE) 500 MG tablet TAKE 2 TABLETS (1,000 MG TOTAL) BY MOUTH 2 (TWO) TIMES DAILY WITH A MEAL. 120 tablet 2   methocarbamol (ROBAXIN) 750 MG tablet Take 1 tablet (750 mg total) by mouth every 8 (eight) hours as needed for muscle spasms. 90 tablet 1   pregabalin (LYRICA) 75 MG capsule Take 75 mg by mouth 3 (three) times daily.     rosuvastatin (CRESTOR) 10 MG tablet  Take 1 tablet (10 mg total) by mouth daily. 90 tablet 3   No current facility-administered medications on file prior to visit.     ROS see history of present illness  Objective  Physical Exam Vitals:   12/03/20 1058  BP: 130/80  Pulse: 81  Temp: 98.1 F (36.7 C)  SpO2: 99%    BP Readings from Last 3 Encounters:  12/03/20 130/80  09/27/20 (!) 164/83  08/12/20 137/65   Wt Readings from Last 3 Encounters:  12/03/20 189 lb 6.4 oz (85.9 kg)  09/27/20 183 lb (83 kg)  08/12/20 184 lb 12.8 oz (83.8 kg)    Physical Exam Constitutional:      General: She is not in acute distress.    Appearance: She is not diaphoretic.  Cardiovascular:     Rate and Rhythm: Normal rate and regular rhythm.     Heart sounds: Normal heart sounds.  Pulmonary:     Effort: Pulmonary effort is normal.     Breath sounds: Normal breath sounds.  Musculoskeletal:     Comments: 1+ pitting edema bilateral lower extremities  Skin:    General: Skin is warm and dry.  Neurological:     Mental Status: She is alert.   EKG: Normal sinus rhythm, rate 75, RSR prime V1, T wave inversion lead III  Assessment/Plan: Please see individual problem list.  Problem List Items Addressed This Visit     Bilateral leg edema    New onset issue.  This may be related to the Lyrica though we will obtain lab work to evaluate for other causes.  If the lab work is unremarkable she may need to decrease her dose of Lyrica.       Relevant Orders   CBC   Comp Met (CMET)   TSH   B Nat Peptide   HTN (hypertension)    Worsened.  Chronic issue.  Patient had several elevated readings though today is at a much more acceptable level.  She will continue on lisinopril 10 mg once daily.  She will monitor her blood pressure over the weekend and send [place] her readings.  She trends back up to above 150/80 she will increase her lisinopril to 20 mg once daily and let us know.  I did discuss that depending on her lab work that advice may  change and we would contact her if she had significant abnormalities on her labs.  I also discussed that potentially the Lyrica could be contributing to the blood pressure elevation.  She may need to decrease the dose depending on her lab results and how she progresses.       Relevant Orders   Comp Met (CMET)   Palpitations - Primary    Undetermined cause though certainly the Lyrica could be playing a role given the time course of dose change in her symptom onset.  EKG performed today and is reassuring.  Lab work as outlined.       Relevant Orders   EKG 12-Lead (Completed)   CBC   Comp Met (CMET)   TSH    Return in about 3 weeks (around 12/24/2020) for With PCP or [facility] for follow-up on blood pressure.  This visit occurred during the SARS-CoV-2 public health emergency.  Safety protocols were in place, including screening questions prior to the visit, additional usage of staff PPE, and extensive cleaning of exam room while observing appropriate contact time as indicated for disinfecting solutions.    Tommi Rumps, MD [place]

## 2020-12-03 NOTE — Patient Instructions (Signed)
Nice to see you. We are going to to get lab work to evaluate for a cause of your symptoms. If your blood pressure trends up back over 150/80 you can increase your lisinopril dose to 20 mg once daily.  Please send [place] a message early next week with your blood pressure readings and let us know if you have increased your lisinopril dose. Depending on your lab results we may need to consider decreasing your dose of Lyrica to your prior dosing.

## 2020-12-03 NOTE — Assessment & Plan Note (Addendum)
Worsened.  Chronic issue.  Patient had several elevated readings though today is at a much more acceptable level.  She will continue on lisinopril 10 mg once daily.  She will monitor her blood pressure over the weekend and send [place] her readings.  She trends back up to above 150/80 she will increase her lisinopril to 20 mg once daily and let us know.  I did discuss that depending on her lab work that advice may change and we would contact her if she had significant abnormalities on her labs.  I also discussed that potentially the Lyrica could be contributing to the blood pressure elevation.  She may need to decrease the dose depending on her lab results and how she progresses.

## 2020-12-06 NOTE — Progress Notes (Signed)
I called and spoke with the patient and informed her of her labs and she understood.  I informed her to go back to her prior dose and let neurology know what's going on and she understood.  Larae Caison,cma

## 2020-12-24 ENCOUNTER — Ambulatory Visit: Payer: Managed Care, Other (non HMO) | Admitting: Family Medicine

## 2021-01-12 ENCOUNTER — Ambulatory Visit: Payer: Managed Care, Other (non HMO) | Admitting: Family Medicine

## 2021-01-18 ENCOUNTER — Ambulatory Visit
Admission: RE | Admit: 2021-01-18 | Discharge: 2021-01-18 | Disposition: A | Discharge status: Home / Self Care | Payer: Managed Care, Other (non HMO) | Source: Ambulatory Visit | Attending: Pulmonary Disease | Admitting: Pulmonary Disease

## 2021-01-18 ENCOUNTER — Other Ambulatory Visit: Payer: Self-pay

## 2021-01-18 DIAGNOSIS — R911 Solitary pulmonary nodule: Secondary | ICD-10-CM | POA: Diagnosis present

## 2021-01-18 DIAGNOSIS — IMO0001 Reserved for inherently not codable concepts without codable children: Secondary | ICD-10-CM

## 2021-02-14 ENCOUNTER — Ambulatory Visit: Payer: Managed Care, Other (non HMO) | Admitting: Adult Health

## 2021-02-16 ENCOUNTER — Other Ambulatory Visit: Payer: Self-pay

## 2021-02-16 MED ORDER — ROSUVASTATIN CALCIUM 10 MG PO TABS
10.0000 mg | ORAL_TABLET | Freq: Every day | ORAL | 3 refills | Status: DC
Start: 2021-02-16 — End: 2022-05-16

## 2021-03-05 ENCOUNTER — Other Ambulatory Visit: Payer: Self-pay | Admitting: Adult Health

## 2021-03-05 DIAGNOSIS — Z1211 Encounter for screening for malignant neoplasm of colon: Secondary | ICD-10-CM

## 2021-03-05 DIAGNOSIS — E1159 Type 2 diabetes mellitus with other circulatory complications: Secondary | ICD-10-CM

## 2021-03-05 DIAGNOSIS — E785 Hyperlipidemia, unspecified: Secondary | ICD-10-CM

## 2021-03-05 DIAGNOSIS — Z1389 Encounter for screening for other disorder: Secondary | ICD-10-CM

## 2021-03-05 DIAGNOSIS — E538 Deficiency of other specified B group vitamins: Secondary | ICD-10-CM

## 2021-03-05 DIAGNOSIS — E119 Type 2 diabetes mellitus without complications: Secondary | ICD-10-CM

## 2021-03-05 DIAGNOSIS — Z1231 Encounter for screening mammogram for malignant neoplasm of breast: Secondary | ICD-10-CM

## 2021-03-05 DIAGNOSIS — E559 Vitamin D deficiency, unspecified: Secondary | ICD-10-CM

## 2021-03-05 DIAGNOSIS — I152 Hypertension secondary to endocrine disorders: Secondary | ICD-10-CM

## 2021-03-09 ENCOUNTER — Encounter: Payer: Self-pay | Admitting: Family Medicine

## 2021-03-09 ENCOUNTER — Other Ambulatory Visit: Payer: Self-pay

## 2021-03-09 ENCOUNTER — Ambulatory Visit (INDEPENDENT_AMBULATORY_CARE_PROVIDER_SITE_OTHER): Payer: Managed Care, Other (non HMO) | Admitting: Family Medicine

## 2021-03-09 VITALS — BP 118/80 | HR 86 | Temp 98.2°F | Ht 62.0 in | Wt 196.6 lb

## 2021-03-09 DIAGNOSIS — Z23 Encounter for immunization: Secondary | ICD-10-CM | POA: Diagnosis not present

## 2021-03-09 DIAGNOSIS — E119 Type 2 diabetes mellitus without complications: Secondary | ICD-10-CM | POA: Diagnosis not present

## 2021-03-09 DIAGNOSIS — I10 Essential (primary) hypertension: Secondary | ICD-10-CM | POA: Diagnosis not present

## 2021-03-09 DIAGNOSIS — K219 Gastro-esophageal reflux disease without esophagitis: Secondary | ICD-10-CM | POA: Diagnosis not present

## 2021-03-09 DIAGNOSIS — G629 Polyneuropathy, unspecified: Secondary | ICD-10-CM | POA: Diagnosis not present

## 2021-03-09 LAB — POCT GLYCOSYLATED HEMOGLOBIN (HGB A1C): Hemoglobin A1C: 7.6 % — AB (ref 4.0–5.6)

## 2021-03-09 MED ORDER — DAPAGLIFLOZIN PROPANEDIOL 5 MG PO TABS: 5.0000 mg | ORAL_TABLET | Freq: Every day | ORAL | 2 refills | Status: DC

## 2021-03-09 MED ORDER — OMEPRAZOLE 20 MG PO CPDR: 20.0000 mg | DELAYED_RELEASE_CAPSULE | Freq: Every day | ORAL | 0 refills | Status: DC

## 2021-03-09 NOTE — Assessment & Plan Note (Signed)
Adequately controlled.  She will continue lisinopril 10 mg once daily.

## 2021-03-09 NOTE — Patient Instructions (Addendum)
Nice to see you. We will start you on Farxiga 5 mg once daily.  If you develop any urinary tract infection symptoms or vaginal yeast infection symptoms please let us know.  If you develop any signs of other genital infections you need to let us know as well.  If you have an illness with vomiting or diarrhea or you are not taking in good fluids by mouth you need to stop the [place] until the symptoms resolved. Please check with the neurologist regarding the neuropathy. We will treat your reflux with omeprazole once daily for 14 days.  Please take this 30 minutes before eating breakfast.

## 2021-03-09 NOTE — Assessment & Plan Note (Signed)
Discussed a short course of omeprazole 20 mg once daily 30 minutes prior to breakfast.  If this is not beneficial she will let us know.

## 2021-03-09 NOTE — Progress Notes (Signed)
Tommi Rumps, MD Phone: [phone]  Sherry Hobbs is a 50 y.o. female who presents today for f/u.  DIABETES Disease Monitoring: Blood Sugar ranges-200s Polyuria/phagia/dipsia- no      Optho- UTD Medications: Compliance- taking metformin 1000 mg BID Hypoglycemic symptoms- no  HYPERTENSION Disease Monitoring Home BP Monitoring not checking Chest pain- no    Dyspnea- no Medications Compliance-  taking lisinopril.   Edema- notes only a sock line at the end of the day BMET    Component Value Date/Time   NA 139 12/03/2020 1129   NA 140 08/12/2020 0925   K 4.1 12/03/2020 1129   CL 102 12/03/2020 1129   CO2 29 12/03/2020 1129   GLUCOSE 120 (H) 12/03/2020 1129   BUN 14 12/03/2020 1129   BUN 12 08/12/2020 0925   CREATININE 1.02 12/03/2020 1129   CALCIUM 9.4 12/03/2020 1129   GFRNONAA >60 11/21/2019 0739   GFRAA >60 11/21/2019 0739   Neuropathy: Patient notes she saw neurology recently.  They increased her dose of Lyrica to 200 mg at night along with 100 mg in the morning and 100 mg in the middle of the day.  Since going up on the dose she has been staggery in the morning for several hours.  GERD: Patient notes 1 month of reflux with sour taste and burning in her throat.  She does not drink coffee.  She has 1-2 alcoholic beverages on the weekend.  She does eat a lot of tomato sauce.  No abdominal pain, blood in her stool, or dysphagia.   Social History   Tobacco Use  Smoking Status Never  Smokeless Tobacco Never    Current Outpatient Medications on File Prior to Visit  Medication Sig Dispense Refill   Ascorbic Acid (VITAMIN C PO) Take by mouth.     cholecalciferol (VITAMIN D3) 25 MCG (1000 UNIT) tablet Take 1,000 Units by mouth daily.     cyanocobalamin (,VITAMIN B-12,) 1000 MCG/ML injection 1000 mcg (1 mg) injection once per week for four weeks, followed by 1000 mcg injection once per month. (Patient taking differently: Inject 1,000 mcg into the muscle every 30  (thirty) days.) 1 mL 15   docusate sodium (COLACE) 100 MG capsule Take 100 mg by mouth 2 (two) times daily as needed for mild constipation.     gabapentin (NEURONTIN) 100 MG capsule Take 200 mg by mouth 2 (two) times daily.     lisinopril (ZESTRIL) 10 MG tablet Take 1 tablet (10 mg total) by mouth every morning. 90 tablet 1   MAGNESIUM OXIDE PO Take by mouth.     metFORMIN (GLUCOPHAGE) 500 MG tablet TAKE 2 TABLETS (1,000 MG TOTAL) BY MOUTH 2 (TWO) TIMES DAILY WITH A MEAL. 120 tablet 2   methocarbamol (ROBAXIN) 750 MG tablet Take 1 tablet (750 mg total) by mouth every 8 (eight) hours as needed for muscle spasms. 90 tablet 1   pregabalin (LYRICA) 75 MG capsule Take 75 mg by mouth 3 (three) times daily.     rosuvastatin (CRESTOR) 10 MG tablet Take 1 tablet (10 mg total) by mouth daily. 90 tablet 3   No current facility-administered medications on file prior to visit.     ROS see history of present illness  Objective  Physical Exam Vitals:   03/09/21 1534  BP: 118/80  Pulse: 86  Temp: 98.2 F (36.8 C)  SpO2: 98%    BP Readings from Last 3 Encounters:  03/09/21 118/80  12/03/20 130/80  09/27/20 (!) 164/83   [facility]  Readings from Last 3 Encounters:  03/09/21 196 lb 9.6 oz (89.2 kg)  12/03/20 189 lb 6.4 oz (85.9 kg)  09/27/20 183 lb (83 kg)    Physical Exam Constitutional:      General: She is not in acute distress.    Appearance: She is not diaphoretic.  Cardiovascular:     Rate and Rhythm: Normal rate and regular rhythm.     Heart sounds: Normal heart sounds.  Pulmonary:     Effort: Pulmonary effort is normal.     Breath sounds: Normal breath sounds.  Abdominal:     General: Bowel sounds are normal. There is no distension.     Palpations: Abdomen is soft.     Tenderness: There is no abdominal tenderness. There is no guarding or rebound.  Skin:    General: Skin is warm and dry.  Neurological:     Mental Status: She is alert.     Assessment/Plan: Please see individual  problem list.  Problem List Items Addressed This Visit     DM (diabetes mellitus) ([place])    Uncontrolled.  She will continue metformin 1000 mg twice daily.  We will add Farxiga 5 mg once daily.  Discussed the potential risk for genital yeast and urinary tract infections.  Discussed the potential for more significant genital infections.  She will contact us if she develops any symptoms.  Advised on sick day precautions and discussed holding this medication if she had an illness with vomiting, diarrhea, or poor oral intake.  Discussed she could resume the medicine after her symptoms improved.  She will let us know if this is excessively expensive.  She will follow-up in 3 months with her PCP.      Relevant Medications   dapagliflozin propanediol (FARXIGA) 5 MG TABS tablet   Other Relevant Orders   POCT HgB A1C (Completed)   GERD (gastroesophageal reflux disease)    Discussed a short course of omeprazole 20 mg once daily 30 minutes prior to breakfast.  If this is not beneficial she will let us know.      Relevant Medications   omeprazole (PRILOSEC) 20 MG capsule   HTN (hypertension)    Adequately controlled.  She will continue lisinopril 10 mg once daily.      Neuropathy    Discussed that she needed to decrease her Lyrica back to 100 mg 3 times daily.  I advised her to contact her neurologist to inform them of her symptoms with the increased dose of Lyrica.  She needs to discuss potential other options for treatment with them.      Other Visit Diagnoses     Need for immunization against influenza    -  Primary   Relevant Orders   Flu Vaccine QUAD 42mo+IM (Fluarix, Fluzone & Alfiuria Quad PF) (Completed)        Return in about 3 months (around 06/09/2021) for with PCP for DM.  This visit occurred during the SARS-CoV-2 public health emergency.  Safety protocols were in place, including screening questions prior to the visit, additional usage of staff PPE, and extensive cleaning of exam  room while observing appropriate contact time as indicated for disinfecting solutions.    Tommi Rumps, MD [place]

## 2021-03-09 NOTE — Assessment & Plan Note (Signed)
Uncontrolled.  She will continue metformin 1000 mg twice daily.  We will add Farxiga 5 mg once daily.  Discussed the potential risk for genital yeast and urinary tract infections.  Discussed the potential for more significant genital infections.  She will contact us if she develops any symptoms.  Advised on sick day precautions and discussed holding this medication if she had an illness with vomiting, diarrhea, or poor oral intake.  Discussed she could resume the medicine after her symptoms improved.  She will let us know if this is excessively expensive.  She will follow-up in 3 months with her PCP.

## 2021-03-09 NOTE — Assessment & Plan Note (Signed)
Discussed that she needed to decrease her Lyrica back to 100 mg 3 times daily.  I advised her to contact her neurologist to inform them of her symptoms with the increased dose of Lyrica.  She needs to discuss potential other options for treatment with them.

## 2021-03-14 ENCOUNTER — Other Ambulatory Visit: Payer: Self-pay | Admitting: Adult Health

## 2021-03-14 DIAGNOSIS — Z1389 Encounter for screening for other disorder: Secondary | ICD-10-CM

## 2021-03-14 DIAGNOSIS — I152 Hypertension secondary to endocrine disorders: Secondary | ICD-10-CM

## 2021-03-14 DIAGNOSIS — E559 Vitamin D deficiency, unspecified: Secondary | ICD-10-CM

## 2021-03-14 DIAGNOSIS — E785 Hyperlipidemia, unspecified: Secondary | ICD-10-CM

## 2021-03-14 DIAGNOSIS — E538 Deficiency of other specified B group vitamins: Secondary | ICD-10-CM

## 2021-03-14 DIAGNOSIS — Z1211 Encounter for screening for malignant neoplasm of colon: Secondary | ICD-10-CM

## 2021-03-14 DIAGNOSIS — Z1231 Encounter for screening mammogram for malignant neoplasm of breast: Secondary | ICD-10-CM

## 2021-03-14 DIAGNOSIS — E119 Type 2 diabetes mellitus without complications: Secondary | ICD-10-CM

## 2021-04-14 ENCOUNTER — Other Ambulatory Visit: Payer: Self-pay | Admitting: Adult Health

## 2021-04-14 DIAGNOSIS — Z1211 Encounter for screening for malignant neoplasm of colon: Secondary | ICD-10-CM

## 2021-04-14 DIAGNOSIS — E1159 Type 2 diabetes mellitus with other circulatory complications: Secondary | ICD-10-CM

## 2021-04-14 DIAGNOSIS — E119 Type 2 diabetes mellitus without complications: Secondary | ICD-10-CM

## 2021-04-14 DIAGNOSIS — E538 Deficiency of other specified B group vitamins: Secondary | ICD-10-CM

## 2021-04-14 DIAGNOSIS — I152 Hypertension secondary to endocrine disorders: Secondary | ICD-10-CM

## 2021-04-14 DIAGNOSIS — E785 Hyperlipidemia, unspecified: Secondary | ICD-10-CM

## 2021-04-14 DIAGNOSIS — Z1231 Encounter for screening mammogram for malignant neoplasm of breast: Secondary | ICD-10-CM

## 2021-04-14 DIAGNOSIS — Z1389 Encounter for screening for other disorder: Secondary | ICD-10-CM

## 2021-04-14 DIAGNOSIS — E559 Vitamin D deficiency, unspecified: Secondary | ICD-10-CM

## 2021-04-27 ENCOUNTER — Telehealth: Payer: Self-pay | Admitting: Adult Health

## 2021-04-27 DIAGNOSIS — E559 Vitamin D deficiency, unspecified: Secondary | ICD-10-CM

## 2021-04-27 DIAGNOSIS — E119 Type 2 diabetes mellitus without complications: Secondary | ICD-10-CM

## 2021-04-27 DIAGNOSIS — Z1211 Encounter for screening for malignant neoplasm of colon: Secondary | ICD-10-CM

## 2021-04-27 DIAGNOSIS — Z1389 Encounter for screening for other disorder: Secondary | ICD-10-CM

## 2021-04-27 DIAGNOSIS — E785 Hyperlipidemia, unspecified: Secondary | ICD-10-CM

## 2021-04-27 DIAGNOSIS — Z1231 Encounter for screening mammogram for malignant neoplasm of breast: Secondary | ICD-10-CM

## 2021-04-27 DIAGNOSIS — E538 Deficiency of other specified B group vitamins: Secondary | ICD-10-CM

## 2021-04-27 DIAGNOSIS — E1159 Type 2 diabetes mellitus with other circulatory complications: Secondary | ICD-10-CM

## 2021-05-06 ENCOUNTER — Other Ambulatory Visit: Payer: Self-pay

## 2021-05-06 DIAGNOSIS — Z1231 Encounter for screening mammogram for malignant neoplasm of breast: Secondary | ICD-10-CM

## 2021-05-06 DIAGNOSIS — Z1211 Encounter for screening for malignant neoplasm of colon: Secondary | ICD-10-CM

## 2021-05-06 DIAGNOSIS — E538 Deficiency of other specified B group vitamins: Secondary | ICD-10-CM

## 2021-05-06 DIAGNOSIS — E119 Type 2 diabetes mellitus without complications: Secondary | ICD-10-CM

## 2021-05-06 DIAGNOSIS — E785 Hyperlipidemia, unspecified: Secondary | ICD-10-CM

## 2021-05-06 DIAGNOSIS — E1159 Type 2 diabetes mellitus with other circulatory complications: Secondary | ICD-10-CM

## 2021-05-06 DIAGNOSIS — E559 Vitamin D deficiency, unspecified: Secondary | ICD-10-CM

## 2021-05-06 DIAGNOSIS — Z1389 Encounter for screening for other disorder: Secondary | ICD-10-CM

## 2021-05-06 MED ORDER — LISINOPRIL 10 MG PO TABS: 10.0000 mg | ORAL_TABLET | ORAL | 1 refills | Status: DC

## 2021-05-06 NOTE — Telephone Encounter (Signed)
Patient called in still waiting on refill for  Lisinopril 10 mg, I spoke with Martin Majestic will send over refill today. Please send to [place]. Phone # [phone]

## 2021-05-06 NOTE — Telephone Encounter (Signed)
Patient called in still waiting on refill for  Lisinopril 10 mg, I spoke with Martin Majestic will send over refill today. Please send to [place]. Phone # [phone]

## 2021-05-19 ENCOUNTER — Other Ambulatory Visit: Payer: Self-pay | Admitting: Internal Medicine

## 2021-06-27 ENCOUNTER — Ambulatory Visit: Payer: Managed Care, Other (non HMO) | Admitting: Family Medicine

## 2021-06-28 ENCOUNTER — Other Ambulatory Visit: Payer: Self-pay

## 2021-06-28 ENCOUNTER — Encounter: Payer: Self-pay | Admitting: Student in an Organized Health Care Education/Training Program

## 2021-06-28 ENCOUNTER — Ambulatory Visit
Payer: Managed Care, Other (non HMO) | Attending: Student in an Organized Health Care Education/Training Program | Admitting: Student in an Organized Health Care Education/Training Program

## 2021-06-28 VITALS — BP 124/89 | HR 92 | Temp 97.2°F | Resp 16 | Ht 62.0 in | Wt 188.0 lb

## 2021-06-28 DIAGNOSIS — G894 Chronic pain syndrome: Secondary | ICD-10-CM | POA: Diagnosis not present

## 2021-06-28 DIAGNOSIS — M5416 Radiculopathy, lumbar region: Secondary | ICD-10-CM | POA: Insufficient documentation

## 2021-06-28 DIAGNOSIS — Z9889 Other specified postprocedural states: Secondary | ICD-10-CM | POA: Insufficient documentation

## 2021-06-28 DIAGNOSIS — G8929 Other chronic pain: Secondary | ICD-10-CM

## 2021-06-28 NOTE — Patient Instructions (Signed)
____________________________________________________________________________________________  General Risks and Possible Complications  Patient Responsibilities: It is important that you read this as it is part of your informed consent. It is our duty to inform you of the risks and possible complications associated with treatments offered to you. It is your responsibility as a patient to read this and to ask questions about anything that is not clear or that you believe was not covered in this document.  Patient's Rights: You have the right to refuse treatment. You also have the right to change your mind, even after initially having agreed to have the treatment done. However, under this last option, if you wait until the last second to change your mind, you may be charged for the materials used up to that point.  Introduction: Medicine is not an exact science. Everything in Medicine, including the lack of treatment(s), carries the potential for danger, harm, or loss (which is by definition: Risk). In Medicine, a complication is a secondary problem, condition, or disease that can aggravate an already existing one. All treatments carry the risk of possible complications. The fact that a side effects or complications occurs, does not imply that the treatment was conducted incorrectly. It must be clearly understood that these can happen even when everything is done following the highest safety standards.  No treatment: You can choose not to proceed with the proposed treatment alternative. The "PRO(s)" would include: avoiding the risk of complications associated with the therapy. The "CON(s)" would include: not getting any of the treatment benefits. These benefits fall under one of three categories: diagnostic; therapeutic; and/or palliative. Diagnostic benefits include: getting information which can ultimately lead to improvement of the disease or symptom(s). Therapeutic benefits are those associated with  the successful treatment of the disease. Finally, palliative benefits are those related to the decrease of the primary symptoms, without necessarily curing the condition (example: decreasing the pain from a flare-up of a chronic condition, such as incurable terminal cancer).  General Risks and Complications: These are associated to most interventional treatments. They can occur alone, or in combination. They fall under one of the following six (6) categories: no benefit or worsening of symptoms; bleeding; infection; nerve damage; allergic reactions; and/or death. No benefits or worsening of symptoms: In Medicine there are no guarantees, only probabilities. No healthcare provider can ever guarantee that a medical treatment will work, they can only state the probability that it may. Furthermore, there is always the possibility that the condition may worsen, either directly, or indirectly, as a consequence of the treatment. Bleeding: This is more common if the patient is taking a blood thinner, either prescription or over the counter (example: Goody Powders, Fish oil, Aspirin, Garlic, etc.), or if suffering a condition associated with impaired coagulation (example: Hemophilia, cirrhosis of the liver, low platelet counts, etc.). However, even if you do not have one on these, it can still happen. If you have any of these conditions, or take one of these drugs, make sure to notify your treating physician. Infection: This is more common in patients with a compromised immune system, either due to disease (example: diabetes, cancer, human immunodeficiency virus [HIV], etc.), or due to medications or treatments (example: therapies used to treat cancer and rheumatological diseases). However, even if you do not have one on these, it can still happen. If you have any of these conditions, or take one of these drugs, make sure to notify your treating physician. Nerve Damage: This is more common when the treatment is an    invasive one, but it can also happen with the use of medications, such as those used in the treatment of cancer. The damage can occur to small secondary nerves, or to large primary ones, such as those in the spinal cord and brain. This damage may be temporary or permanent and it may lead to impairments that can range from temporary numbness to permanent paralysis and/or brain death. Allergic Reactions: Any time a substance or material comes in contact with our body, there is the possibility of an allergic reaction. These can range from a mild skin rash (contact dermatitis) to a severe systemic reaction (anaphylactic reaction), which can result in death. Death: In general, any medical intervention can result in death, most of the time due to an unforeseen complication. ____________________________________________________________________________________________   Epidural Steroid Injection Patient Information  Description: The epidural space surrounds the nerves as they exit the spinal cord.  In some patients, the nerves can be compressed and inflamed by a bulging disc or a tight spinal canal (spinal stenosis).  By injecting steroids into the epidural space, we can bring irritated nerves into direct contact with a potentially helpful medication.  These steroids act directly on the irritated nerves and can reduce swelling and inflammation which often leads to decreased pain.  Epidural steroids may be injected anywhere along the spine and from the neck to the low back depending upon the location of your pain.   After numbing the skin with local anesthetic (like Novocaine), a small needle is passed into the epidural space slowly.  You may experience a sensation of pressure while this is being done.  The entire block usually last less than 10 minutes.  Conditions which may be treated by epidural steroids:  Low back and leg pain Neck and arm pain Spinal stenosis Post-laminectomy syndrome Herpes zoster  (shingles) pain Pain from compression fractures  Preparation for the injection:  Do not eat any solid food or dairy products within 8 hours of your appointment.  You may drink clear liquids up to 3 hours before appointment.  Clear liquids include water, black coffee, juice or soda.  No milk or cream please. You may take your regular medication, including pain medications, with a sip of water before your appointment  Diabetics should hold regular insulin (if taken separately) and take 1/2 normal NPH dos the morning of the procedure.  Carry some sugar containing items with you to your appointment. A driver must accompany you and be prepared to drive you home after your procedure.  Bring all your current medications with your. An IV may be inserted and sedation may be given at the discretion of the physician.   A blood pressure cuff, EKG and other monitors will often be applied during the procedure.  Some patients may need to have extra oxygen administered for a short period. You will be asked to provide medical information, including your allergies, prior to the procedure.  We must know immediately if you are taking blood thinners (like Coumadin/Warfarin)  Or if you are allergic to IV iodine contrast (dye). We must know if you could possible be pregnant.  Possible side-effects: Bleeding from needle site Infection (rare, may require surgery) Nerve injury (rare) Numbness & tingling (temporary) Difficulty urinating (rare, temporary) Spinal headache ( a headache worse with upright posture) Light -headedness (temporary) Pain at injection site (several days) Decreased blood pressure (temporary) Weakness in arm/leg (temporary) Pressure sensation in back/neck (temporary)  Call if you experience: Fever/chills associated with headache or increased back/neck pain.   Headache worsened by an upright position. New onset weakness or numbness of an extremity below the injection site Hives or difficulty  breathing (go to the emergency room) Inflammation or drainage at the infection site Severe back/neck pain Any new symptoms which are concerning to you  Please note:  Although the local anesthetic injected can often make your back or neck feel good for several hours after the injection, the pain will likely return.  It takes 3-7 days for steroids to work in the epidural space.  You may not notice any pain relief for at least that one week.  If effective, we will often do a series of three injections spaced 3-6 weeks apart to maximally decrease your pain.  After the initial series, we generally will wait several months before considering a repeat injection of the same type.  If you have any questions, please call [phone] [facility] 

## 2021-06-28 NOTE — Progress Notes (Signed)
PROVIDER NOTE: Information contained herein reflects review and annotations entered in association with encounter. Interpretation of such information and data should be left to medically-trained personnel. Information provided to patient can be located elsewhere in the medical record under "Patient Instructions". Document created using STT-dictation technology, any transcriptional errors that may result from process are unintentional.    Patient: Sherry Hobbs  Service Category: E/M  Provider: Gillis Santa, MD  DOB: 06-23-1970  DOS: 06/28/2021  Specialty: Interventional Pain Management  MRN: [id number]  Setting: Ambulatory outpatient  PCP: Sherry Beam, FNP  Type: Established Patient    Referring Provider: Sharmon Leyden*  Location: Office  Delivery: Face-to-face     HPI  [name], a 51 y.o. year old female, is here today because of her Chronic radicular lumbar pain [M54.16, G89.29]. [name] primary complain today is Hip Pain (Left) Last encounter: My last encounter with her was on 06/30/20 Pertinent problems: [name] has History of lumbar surgery (L4-L5 laminectomy for cyst removal Dr Lacinda Axon 06/2018); Chronic radicular lumbar pain (left); Lumbar radiculopathy (left); Chronic pain syndrome; Left hip pain; and Low back pain on their pertinent problem list. Pain Assessment: Severity of Chronic pain is reported as a 4 /10. Location: Hip Left/radiates around into the buttocks and across the lower back.. Onset: More than a month ago. Quality: Discomfort, Constant, Pressure, Other (Comment) (grabbing sensation upon rising). Timing: Constant. Modifying factor(s): advil, heating pad. Vitals:  height is _0  (1.575 m) and weight is 188 lb (85.3 kg). Her temporal temperature is 97.2 F (36.2 C) (abnormal). Her blood pressure is 124/89 and her pulse is 92. Her respiration is 16 and oxygen saturation is 98%.   Reason for encounter: evaluation of worsening, or previously known  (established) problem.    [name] is a pleasant 51 year old female presents today with low back pain with radiation into her left buttock and left hip that is gotten worse over the last month.  She has a history of L4-L5 laminectomy.  She has had 2 previous lumbar epidural steroid injections with me in the past when she had similar sciatica symptoms first 1 was March 05, 2019 followed by her second one in February 16, 2020.  She states that she is due for a another epidural steroid injection.  No other significant changes in her medical history other than worsening of her left lumbar radicular pain in the context of having L4-L5 laminectomy.  Of note patient tries to perform physical therapy exercises that she is learned in the past with formal physical therapy but is fairly limited and has not been able to perform any stretches given increased pain.  ROS  Constitutional: Denies any fever or chills Gastrointestinal: No reported hemesis, hematochezia, vomiting, or acute GI distress Musculoskeletal:  Low back pain with radiation into the left lateral hip and left lateral thigh, dermatomal Neurological: No reported episodes of acute onset apraxia, aphasia, dysarthria, agnosia, amnesia, paralysis, loss of coordination, or loss of consciousness  Medication Review  Ascorbic Acid, Magnesium Oxide, cholecalciferol, cyanocobalamin, dapagliflozin propanediol, docusate sodium, gabapentin, lisinopril, metFORMIN, methocarbamol, nortriptyline, omeprazole, pregabalin, and rosuvastatin  History Review  Allergy: [name] is allergic to codeine. Drug: [name]  reports no history of drug use. Alcohol:  reports current alcohol use. Tobacco:  reports that she has never smoked. She has never used smokeless tobacco. Social: [name]  reports that she has never smoked. She has never used smokeless tobacco. She reports current alcohol use. She reports  that she does not use drugs. Medical:  has a past medical  history of Anemia, Arthritis, Depression, Diabetes mellitus without complication ([facility]), DM (diabetes mellitus) ([place]) (08/07/2019), Dysuria (12/01/2019), Elevated serum creatinine, GERD (gastroesophageal reflux disease), Headache, History of blood transfusion (05/02/2016), HTN (hypertension) (08/07/2019), Hypertension, and Neuropathy. Surgical: [name]  has a past surgical history that includes Dilation and curettage of uterus (05/03/2016); Dilatation & currettage/hysteroscopy with novasure ablation (N/A, 12/03/2017); Lumbar laminectomy/decompression microdiscectomy (Left, 06/24/2018); Back surgery; Laparoscopic assisted vaginal hysterectomy (N/A, 07/07/2019); Laparoscopic bilateral salpingectomy (Bilateral, 07/07/2019); Abdominal hysterectomy; and Colonoscopy with propofol (N/A, 09/27/2020). Family: family history includes Bladder Cancer in her maternal grandfather; Brain cancer in her maternal uncle; Breast cancer in her mother; CAD in her brother and father; COPD in her mother; Cancer in her sister; Celiac disease in her brother; Diabetes in her brother, brother, maternal grandmother, mother, and sister; Emphysema in her mother; Heart disease in her brother; Lung cancer (age of onset: 33) in her mother; Nevi in her sister; Osteoporosis in her mother; Renal cancer in her maternal uncle; Stroke in her maternal grandmother.  Laboratory Chemistry Profile   Renal Lab Results  Component Value Date   BUN 14 12/03/2020   CREATININE 1.02 12/03/2020   BCR 11 08/12/2020   GFR 65.46 12/03/2020   GFRAA >60 11/21/2019   GFRNONAA >60 11/21/2019    Hepatic Lab Results  Component Value Date   AST 28 12/03/2020   ALT 32 12/03/2020   ALBUMIN 4.0 12/03/2020   ALKPHOS 88 12/03/2020   HCVAB <0.1 03/30/2016   LIPASE 34 11/21/2019    Electrolytes Lab Results  Component Value Date   NA 139 12/03/2020   K 4.1 12/03/2020   CL 102 12/03/2020   CALCIUM 9.4 12/03/2020    Bone Lab Results  Component Value Date    VD25OH 36.9 08/12/2020    Inflammation (CRP: Acute Phase) (ESR: Chronic Phase) No results found for: CRP, ESRSEDRATE, LATICACIDVEN       Note: Above Lab results reviewed.  Recent Imaging Review  CT CHEST WO CONTRAST CLINICAL DATA:  Follow-up lung nodule.  EXAM: CT CHEST WITHOUT CONTRAST  TECHNIQUE: Multidetector CT imaging of the chest was performed following the standard protocol without IV contrast.  COMPARISON:  12/25/2019  FINDINGS: Cardiovascular: Heart size normal.  No pericardial effusion.  Mediastinum/Nodes: 1.1 cm left lobe of thyroid gland nodule is identified, image 10/2. Not clinically significant; no follow-up imaging recommended (ref: J Am Coll Radiol. 2015 Feb;12(2): 143-50).The trachea appears patent and is midline. Normal appearance of the esophagus. No enlarged mediastinal or hilar lymph nodes.  Lungs/Pleura: 3 mm perifissural nodule within the right middle lobe is unchanged compared with previous exam compatible with a benign abnormality, image 76/3. No additional lung nodules identified.  Upper Abdomen: Mild hepatic steatosis. No acute or suspicious findings.  Musculoskeletal: No chest wall mass or suspicious bone lesions identified.  IMPRESSION: 1. Stable 3 mm perifissural nodule within the right middle lobe compatible with a benign abnormality. No further follow-up is indicated at this time. 2. Mild hepatic steatosis. 3. 1.1 cm left lobe of thyroid gland nodule is identified. Not clinically significant; no follow-up imaging recommended (ref: J Am Coll Radiol. 2015 Feb;12(2): 143-50).  Electronically Signed   By: Kerby Moors M.D.   On: 01/19/2021 08:02 Note: Reviewed        Physical Exam  General appearance: Well nourished, well developed, and well hydrated. In no apparent acute distress Mental status: Alert, oriented x 3 (person, place, &  time)       Respiratory: No evidence of acute respiratory distress Eyes: PERLA Vitals: BP 124/89  (BP Location: Right Arm, Patient Position: Sitting, Cuff Size: Normal)    Pulse 92    Temp (!) 97.2 F (36.2 C) (Temporal)    Resp 16    Ht _0  (1.575 m)    Wt 188 lb (85.3 kg)    LMP 11/26/2017    SpO2 98%    BMI 34.39 kg/m  BMI: Estimated body mass index is 34.39 kg/m as calculated from the following:   Height as of this encounter: _1  (1.575 m).   Weight as of this encounter: 188 lb (85.3 kg). Ideal: Ideal body weight: 50.1 kg (110 lb 7.2 oz) Adjusted ideal body weight: 64.2 kg (141 lb 7.5 oz)  Lumbar Spine Area Exam  Skin & Axial Inspection: Well healed scar from previous spine surgery detected Alignment: Symmetrical Functional ROM: Pain restricted ROM       Stability: No instability detected Muscle Tone/Strength: Functionally intact. No obvious neuro-muscular anomalies detected. Sensory (Neurological): Dermatomal pain pattern left Palpation: No palpable anomalies       Provocative Tests: Hyperextension/rotation test: deferred today       Lumbar quadrant test (Kemp's test): deferred today       Lateral bending test: (+) ipsilateral radicular pain, on the left. Positive for left-sided foraminal stenosis.    Gait & Posture Assessment  Ambulation: Unassisted Gait: Relatively normal for age and body habitus Posture: WNL    Lower Extremity Exam      Side: Right lower extremity   Side: Left lower extremity  Stability: No instability observed           Stability: No instability observed          Skin & Extremity Inspection: Skin color, temperature, and hair growth are WNL. No peripheral edema or cyanosis. No masses, redness, swelling, asymmetry, or associated skin lesions. No contractures.   Skin & Extremity Inspection: Skin color, temperature, and hair growth are WNL. No peripheral edema or cyanosis. No masses, redness, swelling, asymmetry, or associated skin lesions. No contractures.  Functional ROM: Unrestricted ROM                   Functional ROM: Pain restricted ROM for hip  and knee joints          Muscle Tone/Strength: Functionally intact. No obvious neuro-muscular anomalies detected.   Muscle Tone/Strength: Functionally intact. No obvious neuro-muscular anomalies detected.  Sensory (Neurological): Unimpaired         Sensory (Neurological): Dermatomal pain pattern left L5-S1      DTR: Patellar: 1+: trace Achilles: 1+: trace Plantar: deferred today   DTR: Patellar: 1+: trace Achilles: 1+: trace Plantar: deferred today  Palpation: No palpable anomalies   Palpation: No palpable anomalies     Assessment   Status Diagnosis  Having a Flare-up Having a Flare-up Persistent 1. Chronic radicular lumbar pain   2. Lumbar radiculopathy   3. History of lumbar surgery   4. Chronic pain syndrome       Plan of Care   No orders of the defined types were placed in this encounter.  Orders:  Orders Placed This Encounter  Procedures   Lumbar Epidural Injection    Standing Status:   Future    Standing Expiration Date:   07/26/2021    Scheduling Instructions:     Procedure: Interlaminar Lumbar Epidural Steroid injection (LESI)  Laterality: Midline     LEFT L5/S1    Order Specific Question:   Where will this procedure be performed?    Answer:   [facility]   Follow-up plan:   Return in about 1 week (around 07/05/2021) for Left L5/S1 ESI , in clinic [facility].     History of L4-5 laminectomy with Dr. Lacinda Axon due to synovial cyst now having left L4-L5 radicular pain status post L5-S1 ESI on the left on 03/05/2019, 02/16/20.  Status post bilateral L3, L4, L5 lumbar facet medial branch nerve block 06/02/2020.       Recent Visits No visits were found meeting these conditions. Showing recent visits within past 90 days and meeting all other requirements Today's Visits Date Type Provider Dept  06/28/21 Office Visit Sherry Santa, MD [facility]  Showing today's visits and meeting all other requirements Future Appointments No visits were found  meeting these conditions. Showing future appointments within next 90 days and meeting all other requirements  I discussed the assessment and treatment plan with the patient. The patient was provided an opportunity to ask questions and all were answered. The patient agreed with the plan and demonstrated an understanding of the instructions.  Patient advised to call back or seek an in-person evaluation if the symptoms or condition worsens.  Duration of encounter: 72mnutes.  Note by: BGillis Santa MD Date: 06/28/2021; Time: 9:17 AM

## 2021-06-28 NOTE — Progress Notes (Signed)
Safety precautions to be maintained throughout the outpatient stay will include: orient to surroundings, keep bed in low position, maintain call bell within reach at all times, provide assistance with transfer out of bed and ambulation.  

## 2021-06-29 ENCOUNTER — Ambulatory Visit: Payer: Managed Care, Other (non HMO) | Admitting: Adult Health

## 2021-07-05 ENCOUNTER — Ambulatory Visit: Payer: Managed Care, Other (non HMO) | Admitting: Adult Health

## 2021-07-12 ENCOUNTER — Other Ambulatory Visit: Payer: Self-pay

## 2021-07-12 ENCOUNTER — Encounter: Payer: Self-pay | Admitting: Adult Health

## 2021-07-12 ENCOUNTER — Ambulatory Visit (INDEPENDENT_AMBULATORY_CARE_PROVIDER_SITE_OTHER): Payer: Managed Care, Other (non HMO) | Admitting: Adult Health

## 2021-07-12 VITALS — BP 120/74 | HR 91 | Temp 98.0°F | Ht 62.0 in | Wt 188.0 lb

## 2021-07-12 DIAGNOSIS — E785 Hyperlipidemia, unspecified: Secondary | ICD-10-CM

## 2021-07-12 DIAGNOSIS — D649 Anemia, unspecified: Secondary | ICD-10-CM | POA: Diagnosis not present

## 2021-07-12 DIAGNOSIS — E119 Type 2 diabetes mellitus without complications: Secondary | ICD-10-CM | POA: Diagnosis not present

## 2021-07-12 DIAGNOSIS — Z90710 Acquired absence of both cervix and uterus: Secondary | ICD-10-CM

## 2021-07-12 DIAGNOSIS — E559 Vitamin D deficiency, unspecified: Secondary | ICD-10-CM | POA: Diagnosis not present

## 2021-07-12 DIAGNOSIS — Z1231 Encounter for screening mammogram for malignant neoplasm of breast: Secondary | ICD-10-CM

## 2021-07-12 DIAGNOSIS — Z0000 Encounter for general adult medical examination without abnormal findings: Secondary | ICD-10-CM | POA: Insufficient documentation

## 2021-07-12 DIAGNOSIS — I10 Essential (primary) hypertension: Secondary | ICD-10-CM

## 2021-07-12 DIAGNOSIS — E538 Deficiency of other specified B group vitamins: Secondary | ICD-10-CM | POA: Diagnosis not present

## 2021-07-12 DIAGNOSIS — L84 Corns and callosities: Secondary | ICD-10-CM

## 2021-07-12 LAB — COMPREHENSIVE METABOLIC PANEL
ALT: 36 U/L — ABNORMAL HIGH (ref 0–35)
AST: 37 U/L (ref 0–37)
Albumin: 3.9 g/dL (ref 3.5–5.2)
Alkaline Phosphatase: 97 U/L (ref 39–117)
BUN: 14 mg/dL (ref 6–23)
CO2: 26 mEq/L (ref 19–32)
Calcium: 9.5 mg/dL (ref 8.4–10.5)
Chloride: 102 mEq/L (ref 96–112)
Creatinine, Ser: 1.07 mg/dL (ref 0.40–1.20)
GFR: 61.55 mL/min (ref >60.00)
Glucose, Bld: 169 mg/dL — ABNORMAL HIGH (ref 70–99)
Potassium: 4.3 mEq/L (ref 3.5–5.1)
Sodium: 139 mEq/L (ref 135–145)
Total Bilirubin: 0.7 mg/dL (ref 0.2–1.2)
Total Protein: 6.4 g/dL (ref 6.0–8.3)

## 2021-07-12 LAB — CBC WITH DIFFERENTIAL/PLATELET
Basophils Absolute: 0 10*3/uL (ref 0.0–0.1)
Basophils Relative: 0.4 % (ref 0.0–3.0)
Eosinophils Absolute: 0.3 10*3/uL (ref 0.0–0.7)
Eosinophils Relative: 3 % (ref 0.0–5.0)
HCT: 37.8 % (ref 36.0–46.0)
Hemoglobin: 12.7 g/dL (ref 12.0–15.0)
Lymphocytes Relative: 20.3 % (ref 12.0–46.0)
Lymphs Abs: 1.8 10*3/uL (ref 0.7–4.0)
MCHC: 33.7 g/dL (ref 30.0–36.0)
MCV: 82 fl (ref 78.0–100.0)
Monocytes Absolute: 0.6 10*3/uL (ref 0.1–1.0)
Monocytes Relative: 6.4 % (ref 3.0–12.0)
Neutro Abs: 6.2 10*3/uL (ref 1.4–7.7)
Neutrophils Relative %: 69.9 % (ref 43.0–77.0)
Platelets: 185 10*3/uL (ref 150.0–400.0)
RBC: 4.61 Mil/uL (ref 3.87–5.11)
RDW: 13.4 % (ref 11.5–15.5)
WBC: 8.9 10*3/uL (ref 4.0–10.5)

## 2021-07-12 LAB — LIPID PANEL
Cholesterol: 106 mg/dL (ref 0–200)
HDL: 23.8 mg/dL — ABNORMAL LOW (ref >39.00)
NonHDL: 82.6
Total CHOL/HDL Ratio: 4
Triglycerides: 317 mg/dL — ABNORMAL HIGH (ref 0.0–149.0)
VLDL: 63.4 mg/dL — ABNORMAL HIGH (ref 0.0–40.0)

## 2021-07-12 LAB — MICROALBUMIN / CREATININE URINE RATIO
Creatinine,U: 54.8 mg/dL
Microalb Creat Ratio: 1.3 mg/g (ref 0.0–30.0)
Microalb, Ur: <0.7 mg/dL (ref 0.0–1.9)

## 2021-07-12 LAB — LDL CHOLESTEROL, DIRECT: Direct LDL: 44 mg/dL

## 2021-07-12 LAB — B12 AND FOLATE PANEL
Folate: 11.4 ng/mL (ref >5.9)
Vitamin B-12: 248 pg/mL (ref 211–911)

## 2021-07-12 LAB — VITAMIN D 25 HYDROXY (VIT D DEFICIENCY, FRACTURES): VITD: 33.79 ng/mL (ref 30.00–100.00)

## 2021-07-12 LAB — HEMOGLOBIN A1C: Hgb A1c MFr Bld: 8.1 % — ABNORMAL HIGH (ref 4.6–6.5)

## 2021-07-12 LAB — TSH: TSH: 3.19 u[IU]/mL (ref 0.35–5.50)

## 2021-07-12 NOTE — Progress Notes (Signed)
Subjective:    Patient ID: Sherry Hobbs, female    DOB: 1971-03-20, 51 y.o.   MRN: [id number]  CC: Sherry Hobbs is a 51 y.o. female who presents today for physical exam.    HPI: HPI Patient is a 51 year old female in no acute distress who comes to the office for yearly physical.  She feels well.  Recommend yearly eye exam- is due she will schedule. Colorectal Cancer Screening: UTD 09/2020 colonoscopy and endoscopy due again 09/2025 ( 5 years) denies any rectal pain or blood in stools.  Denies any breast mass, lumps, nipple discharge or changes in breast size.  Breast Cancer Screening: Mammogram UTD ordered today.  Cervical Cancer Screening:  declined, history of full hysterectomy.  Bone Health screening/DEXA for 65+: No increased fracture risk. Defer screening at this time.**  Lung Cancer Screening: Doesn't have 20 year pack year history and age > 54 years yo 87 years Non Smoker  AAA screen for all men and women aged 59 to 38 with h/o tobacco, men 35 years older with family h/o AAA and women 73 years or older who have ever smoked or have family history of AAA.      Hepatitis C screening - Candidate for declined HIV Screening- Candidate for declined  Labs: Screening labs today. Exercise: Gets regular exercise.   Alcohol use:  rare  Smoking/tobacco use: Nonsmoker.   Denies any concerns today other than callus on left toe.  Present x 1 year.   Patient  denies any fever, body aches,chills, rash, chest pain, shortness of breath, nausea, vomiting, or diarrhea.  Denies dizziness, lightheadedness, pre syncopal or syncopal episodes.   HISTORY:  Past Medical History:  Diagnosis Date   Anemia    problems when having abnmornal uterine bleeding.   Arthritis    lower back   Depression    Diabetes mellitus without complication ([place])    DM (diabetes mellitus) ([place]) 08/07/2019   Dysuria 12/01/2019   Elevated serum creatinine    GERD (gastroesophageal reflux disease)    OCC  NO MEDS    Headache    occasional migraines   History of blood transfusion 05/02/2016   due to abnormal uterine bleeding   HTN (hypertension) 08/07/2019   Hypertension    Neuropathy    feet    Past Surgical History:  Procedure Laterality Date   ABDOMINAL HYSTERECTOMY     BACK SURGERY     COLONOSCOPY WITH PROPOFOL N/A 09/27/2020   Procedure: COLONOSCOPY WITH PROPOFOL;  Surgeon: Jonathon Bellows, MD;  Location: [facility] ENDOSCOPY;  Service: Gastroenterology;  Laterality: N/A;   DILATION AND CURETTAGE OF UTERUS  05/03/2016   Procedure: DILATATION AND CURETTAGE;  Surgeon: [name], MD;  Location: [facility] ORS;  Service: Gynecology;;   DILITATION & CURRETTAGE/HYSTROSCOPY WITH NOVASURE ABLATION N/A 12/03/2017   Procedure: DILATATION & CURETTAGE/HYSTEROSCOPY WITH MINERVA ABLATION;  Surgeon: [name], MD;  Location: [facility] ORS;  Service: Gynecology;  Laterality: N/A;  MINERVA REP CONTACTED [facility] Derl Beddow [phone])   LAPAROSCOPIC ASSISTED VAGINAL HYSTERECTOMY N/A 07/07/2019   Procedure: LAPAROSCOPIC ASSISTED VAGINAL HYSTERECTOMY;  Surgeon: [name], MD;  Location: [facility] ORS;  Service: Gynecology;  Laterality: N/A;  possible TVH   LAPAROSCOPIC BILATERAL SALPINGECTOMY Bilateral 07/07/2019   Procedure: LAPAROSCOPIC BILATERAL SALPINGECTOMY;  Surgeon: [name], MD;  Location: [facility] ORS;  Service: Gynecology;  Laterality: Bilateral;   LUMBAR LAMINECTOMY/DECOMPRESSION MICRODISCECTOMY Left 06/24/2018   Procedure: LUMBAR HEMILAMINECTOMY, FORAMINOTOMY, CYST REMOVAL - 1 LEVEL LEFT L4-5;  Surgeon: [name], MD;  Location: [facility] ORS;  Service: Neurosurgery;  Laterality: Left;   Family History  Problem Relation Age of Onset   Diabetes Mother    COPD Mother    Emphysema Mother    Osteoporosis Mother    Lung cancer Mother 70   Breast cancer Mother    CAD Father    Diabetes Sister    Diabetes Brother    CAD Brother    Celiac disease Brother    Renal cancer Maternal Uncle        1 uncle   Brain cancer Maternal  Uncle        different uncle   Diabetes Maternal Grandmother    Stroke Maternal Grandmother    Bladder Cancer Maternal Grandfather    Diabetes Brother    Heart disease Brother    Cancer Sister    [place] Sister       ALLERGIES: Codeine  Current Outpatient Medications on File Prior to Visit  Medication Sig Dispense Refill   Ascorbic Acid (VITAMIN C PO) Take by mouth.     cholecalciferol (VITAMIN D3) 25 MCG (1000 UNIT) tablet Take 1,000 Units by mouth daily.     dapagliflozin propanediol (FARXIGA) 5 MG TABS tablet Take 1 tablet (5 mg total) by mouth daily before breakfast. 90 tablet 2   gabapentin (NEURONTIN) 100 MG capsule Take 200 mg by mouth 2 (two) times daily.     lisinopril (ZESTRIL) 10 MG tablet Take 1 tablet (10 mg total) by mouth every morning. 90 tablet 1   MAGNESIUM OXIDE PO Take by mouth.     metFORMIN (GLUCOPHAGE) 500 MG tablet TAKE 2 TABLETS (1,000 MG TOTAL) BY MOUTH 2 (TWO) TIMES DAILY WITH A MEAL. 120 tablet 2   methocarbamol (ROBAXIN) 750 MG tablet Take 1 tablet (750 mg total) by mouth every 8 (eight) hours as needed for muscle spasms. 90 tablet 1   nortriptyline (PAMELOR) 10 MG capsule Take 10 mg by mouth at bedtime. Increase to 20 mg after 1 week.     omeprazole (PRILOSEC) 20 MG capsule Take 1 capsule (20 mg total) by mouth daily. 14 capsule 0   pregabalin (LYRICA) 100 MG capsule Take 100 mg by mouth in the morning. Morning, afternoon and 200 mg qhs     pregabalin (LYRICA) 75 MG capsule Take 75 mg by mouth 3 (three) times daily.     rosuvastatin (CRESTOR) 10 MG tablet Take 1 tablet (10 mg total) by mouth daily. 90 tablet 3   No current facility-administered medications on file prior to visit.    Social History   Tobacco Use   Smoking status: Never   Smokeless tobacco: Never  Vaping Use   Vaping Use: Never used  Substance Use Topics   Alcohol use: Yes    Comment: occassional   Drug use: No    Review of Systems    Objective:    BP 120/74 (BP Location:  Right Arm, Patient Position: Sitting, Cuff Size: Small)    Pulse 91    Temp 98 F (36.7 C) (Oral)    Ht _0  (1.575 m)    Wt 188 lb (85.3 kg)    LMP 11/26/2017    SpO2 97%    BMI 34.39 kg/m   BP Readings from Last 3 Encounters:  07/12/21 120/74  06/28/21 124/89  03/09/21 118/80   Wt Readings from Last 3 Encounters:  07/12/21 188 lb (85.3 kg)  06/28/21 188 lb (85.3 kg)  03/09/21 196 lb 9.6 oz (89.2 kg)  Physical Exam Vitals reviewed.  Constitutional:      General: She is not in acute distress.    Appearance: She is normal weight. She is not ill-appearing, toxic-appearing or diaphoretic.  HENT:     Head: Normocephalic and atraumatic.     Right Ear: Tympanic membrane, ear canal and external ear normal. There is no impacted cerumen.     Left Ear: Tympanic membrane, ear canal and external ear normal. There is no impacted cerumen.     Nose: Nose normal. No congestion or rhinorrhea.     Mouth/Throat:     Mouth: Mucous membranes are moist.     Pharynx: No oropharyngeal exudate or posterior oropharyngeal erythema.  Eyes:     General: No scleral icterus.       Right eye: No discharge.        Left eye: No discharge.     Extraocular Movements: Extraocular movements intact.     Conjunctiva/sclera: Conjunctivae normal.     Pupils: Pupils are equal, round, and reactive to light.  Neck:     Vascular: No carotid bruit.  Cardiovascular:     Rate and Rhythm: Normal rate and regular rhythm.     Pulses: Normal pulses.          Dorsalis pedis pulses are 2+ on the right side and 2+ on the left side.       Posterior tibial pulses are 2+ on the right side and 2+ on the left side.     Heart sounds: Normal heart sounds. No murmur heard.   No friction rub. No gallop.  Pulmonary:     Effort: Pulmonary effort is normal. No respiratory distress.     Breath sounds: Normal breath sounds. No stridor. No wheezing, rhonchi or rales.  Chest:     Chest wall: No tenderness.  Abdominal:     General:  Bowel sounds are normal. There is no distension.     Palpations: Abdomen is soft. There is no mass.     Tenderness: There is no abdominal tenderness. There is no right CVA tenderness, left CVA tenderness, guarding or rebound.     Hernia: No hernia is present.  Musculoskeletal:        General: Normal range of motion.     Cervical back: Normal range of motion and neck supple. No rigidity or tenderness.     Right lower leg: No edema.     Left lower leg: No edema.       Feet:  Feet:     Right foot:     Protective Sensation: 4 sites tested.  4 sites sensed.     Skin integrity: Dry skin present. No ulcer, blister, skin breakdown, erythema, warmth, callus or fissure.     Left foot:     Protective Sensation: 4 sites tested.  4 sites sensed.     Skin integrity: Callus and dry skin present. No ulcer, blister, skin breakdown, erythema, warmth or fissure.  Lymphadenopathy:     Cervical: No cervical adenopathy.  Skin:    General: Skin is warm.     Coloration: Skin is not jaundiced or pale.     Findings: No bruising, erythema, lesion or rash.  Neurological:     Mental Status: She is oriented to person, place, and time.     Gait: Gait normal.     Deep Tendon Reflexes: Reflexes normal.  Psychiatric:        Mood and Affect: Mood normal.  Behavior: Behavior normal.        Thought Content: Thought content normal.        Judgment: Judgment normal.       Assessment & Plan:   Problem List Items Addressed This Visit       Cardiovascular and Mediastinum   HTN (hypertension) - Primary   Relevant Orders   Comp Met (CMET)   Urine Microalbumin w/creat. ratio   TSH     Endocrine   DM (diabetes mellitus) ([place])   Relevant Orders   HgB A1c   Urine Microalbumin w/creat. ratio     Other   Anemia   Relevant Orders   CBC w/Diff   H/O total hysterectomy   Hyperlipidemia   Relevant Orders   Lipid Profile   Encounter for screening mammogram for malignant neoplasm of breast   Relevant  Orders   MM 3D SCREEN BREAST BILATERAL   Vitamin D deficiency   Relevant Orders   VITAMIN D 25 Hydroxy (Vit-D Deficiency, Fractures)   B12 deficiency   Relevant Orders   B12 and Folate Panel   Other Visit Diagnoses     Callus between toes- left        Relevant Orders   Ambulatory referral to Podiatry        I have discontinued Lynetta M. Kleinpeter's cyanocobalamin and docusate sodium. I am also having her maintain her methocarbamol, cholecalciferol, gabapentin, Ascorbic Acid (VITAMIN C PO), MAGNESIUM OXIDE PO, pregabalin, rosuvastatin, dapagliflozin propanediol, omeprazole, lisinopril, metFORMIN, nortriptyline, and pregabalin. Followed by psychiatry/   No orders of the defined types were placed in this encounter.  Declined pelvic or breast exam, denies any concerns.  Return precautions given.   Risks, benefits, and alternatives of the medications and treatment plan prescribed today were discussed, and patient expressed understanding.   Education regarding symptom management and diagnosis given to patient on AVS.   Continue to follow with Jaidan Prevette, Kelby Aline, FNP for routine health maintenance.    Schedule opthalmologic eye exam is advised. Schedule mammogram, [place] [facility] number given. Mammogram screening ordered.  The patient is advised to begin progressive daily aerobic exercise program, follow a low fat, low cholesterol diet, attempt to lose weight, decrease or avoid alcohol intake, reduce salt in diet and cooking, reduce exposure to stress, improve dietary compliance, use calcium 1 gram daily with Vit D, continue current medications, continue current healthy lifestyle patterns, and return for routine annual checkups.  [name] and I agreed with plan.   Marcille Buffy, FNP

## 2021-07-12 NOTE — Patient Instructions (Addendum)
Call to schedule your screening mammogram. Your orders have been placed for your exam.  Let our office know if you have questions, concerns, or any difficulty scheduling.  If normal results then yearly screening mammograms are recommended unless you notice  Changes in your breast then you should schedule a follow up office visit. If abnormal results  Further imaging will be warranted and sooner follow up as determined by the radiologist at the [facility].   [facility] [place], [place] [address]  Main: [phone]   Diabetes Mellitus and Exercise Exercising regularly is important for overall health, especially for people who have diabetes mellitus. Exercising is not only about losing weight. It has many other health benefits, such as increasing muscle strength and bone density and reducing body fat and stress. This leads to improved fitness, flexibility, and endurance, all of which result in better overall health. What are the benefits of exercise if I have diabetes? Exercise has many benefits for people with diabetes. They include: Helping to lower and control blood sugar (glucose). Helping the body to respond better to the hormone insulin by improving insulin sensitivity. Reducing how much insulin the body needs. Lowering the risk for heart disease by: Lowering "bad" cholesterol and triglyceride levels. Increasing "good" cholesterol levels. Lowering blood pressure. Lowering blood glucose levels. What is my activity plan? Your health care provider or certified diabetes educator can help you make a plan for the type and frequency of exercise that works for you. This is called your activity plan. Be sure to: Get at least 150 minutes of medium-intensity or high-intensity exercise each week. Exercises may include brisk walking, biking, or water aerobics. Do stretching and strengthening exercises, such as yoga or weight lifting, at  least 2 times a week. Spread out your activity over at least 3 days of the week. Get some form of physical activity each day. Do not go more than 2 days in a row without some kind of physical activity. Avoid being inactive for more than 90 minutes at a time. Take frequent breaks to walk or stretch. Choose exercises or activities that you enjoy. Set realistic goals. Start slowly and gradually increase your exercise intensity over time. How do I manage my diabetes during exercise? Monitor your blood glucose Check your blood glucose before and after exercising. If your blood glucose is: 240 mg/dL (13.3 mmol/L) or higher before you exercise, check your urine for ketones. These are chemicals created by the liver. If you have ketones in your urine, do not exercise until your blood glucose returns to normal. 100 mg/dL (5.6 mmol/L) or lower, eat a snack containing 15-20 grams of carbohydrate. Check your blood glucose 15 minutes after the snack to make sure that your glucose level is above 100 mg/dL (5.6 mmol/L) before you start your exercise. Know the symptoms of low blood glucose (hypoglycemia) and how to treat it. Your risk for hypoglycemia increases during and after exercise. Follow these tips and your health care provider's instructions Keep a carbohydrate snack that is fast-acting for use before, during, and after exercise to help prevent or treat hypoglycemia. Avoid injecting insulin into areas of the body that are going to be exercised. For example, avoid injecting insulin into: Your arms, when you are about to play tennis. Your legs, when you are about to go jogging. Keep records of your exercise habits. Doing this can help you and your health care provider adjust your diabetes management plan as  needed. Write down: Food that you eat before and after you exercise. Blood glucose levels before and after you exercise. The type and amount of exercise you have done. Work with your health care  provider when you start a new exercise or activity. He or she may need to: Make sure that the activity is safe for you. Adjust your insulin, other medicines, and food that you eat. Drink plenty of water while you exercise. This prevents loss of water (dehydration) and problems caused by a lot of heat in the body (heat stroke). Where to find more information American Diabetes Association: www.diabetes.org Summary Exercising regularly is important for overall health, especially for people who have diabetes mellitus. Exercising has many health benefits. It increases muscle strength and bone density and reduces body fat and stress. It also lowers and controls blood glucose. Your health care provider or certified diabetes educator can help you make an activity plan for the type and frequency of exercise that works for you. Work with your health care provider to make sure any new activity is safe for you. Also work with your health care provider to adjust your insulin, other medicines, and the food you eat. This information is not intended to replace advice given to you by your health care provider. Make sure you discuss any questions you have with your health care provider. Document Revised: 01/27/2019 Document Reviewed: 01/27/2019 Elsevier Patient Education  [place]. Diabetes Mellitus and Exercise Exercising regularly is important for overall health, especially for people who have diabetes mellitus. Exercising is not only about losing weight. It has many other health benefits, such as increasing muscle strength and bone density and reducing body fat and stress. This leads to improved fitness, flexibility, and endurance, all of which result in better overall health. What are the benefits of exercise if I have diabetes? Exercise has many benefits for people with diabetes. They include: Helping to lower and control blood sugar (glucose). Helping the body to respond better to the hormone  insulin by improving insulin sensitivity. Reducing how much insulin the body needs. Lowering the risk for heart disease by: Lowering "bad" cholesterol and triglyceride levels. Increasing "good" cholesterol levels. Lowering blood pressure. Lowering blood glucose levels. What is my activity plan? Your health care provider or certified diabetes educator can help you make a plan for the type and frequency of exercise that works for you. This is called your activity plan. Be sure to: Get at least 150 minutes of medium-intensity or high-intensity exercise each week. Exercises may include brisk walking, biking, or water aerobics. Do stretching and strengthening exercises, such as yoga or weight lifting, at least 2 times a week. Spread out your activity over at least 3 days of the week. Get some form of physical activity each day. Do not go more than 2 days in a row without some kind of physical activity. Avoid being inactive for more than 90 minutes at a time. Take frequent breaks to walk or stretch. Choose exercises or activities that you enjoy. Set realistic goals. Start slowly and gradually increase your exercise intensity over time. How do I manage my diabetes during exercise? Monitor your blood glucose Check your blood glucose before and after exercising. If your blood glucose is: 240 mg/dL (13.3 mmol/L) or higher before you exercise, check your urine for ketones. These are chemicals created by the liver. If you have ketones in your urine, do not exercise until your blood glucose returns to normal. 100 mg/dL (5.6 mmol/L) or  lower, eat a snack containing 15-20 grams of carbohydrate. Check your blood glucose 15 minutes after the snack to make sure that your glucose level is above 100 mg/dL (5.6 mmol/L) before you start your exercise. Know the symptoms of low blood glucose (hypoglycemia) and how to treat it. Your risk for hypoglycemia increases during and after exercise. Follow these tips and your  health care provider's instructions Keep a carbohydrate snack that is fast-acting for use before, during, and after exercise to help prevent or treat hypoglycemia. Avoid injecting insulin into areas of the body that are going to be exercised. For example, avoid injecting insulin into: Your arms, when you are about to play tennis. Your legs, when you are about to go jogging. Keep records of your exercise habits. Doing this can help you and your health care provider adjust your diabetes management plan as needed. Write down: Food that you eat before and after you exercise. Blood glucose levels before and after you exercise. The type and amount of exercise you have done. Work with your health care provider when you start a new exercise or activity. He or she may need to: Make sure that the activity is safe for you. Adjust your insulin, other medicines, and food that you eat. Drink plenty of water while you exercise. This prevents loss of water (dehydration) and problems caused by a lot of heat in the body (heat stroke). Where to find more information American Diabetes Association: www.diabetes.org Summary Exercising regularly is important for overall health, especially for people who have diabetes mellitus. Exercising has many health benefits. It increases muscle strength and bone density and reduces body fat and stress. It also lowers and controls blood glucose. Your health care provider or certified diabetes educator can help you make an activity plan for the type and frequency of exercise that works for you. Work with your health care provider to make sure any new activity is safe for you. Also work with your health care provider to adjust your insulin, other medicines, and the food you eat. This information is not intended to replace advice given to you by your health care provider. Make sure you discuss any questions you have with your health care provider. Document Revised: 01/27/2019  Document Reviewed: 01/27/2019 Elsevier Patient Education  [place] Maintenance, Female Adopting a healthy lifestyle and getting preventive care are important in promoting health and wellness. Ask your health care provider about: The right schedule for you to have regular tests and exams. Things you can do on your own to prevent diseases and keep yourself healthy. What should I know about diet, weight, and exercise? Eat a healthy diet  Eat a diet that includes plenty of vegetables, fruits, low-fat dairy products, and lean protein. Do not eat a lot of foods that are high in solid fats, added sugars, or sodium. Maintain a healthy weight Body mass index (BMI) is used to identify weight problems. It estimates body fat based on height and weight. Your health care provider can help determine your BMI and help you achieve or maintain a healthy weight. Get regular exercise Get regular exercise. This is one of the most important things you can do for your health. Most adults should: Exercise for at least 150 minutes each week. The exercise should increase your heart rate and make you sweat (moderate-intensity exercise). Do strengthening exercises at least twice a week. This is in addition to the moderate-intensity exercise. Spend less time sitting. Even light physical activity can be  beneficial. Watch cholesterol and blood lipids Have your blood tested for lipids and cholesterol at 49 years of age, then have this test every 5 years. Have your cholesterol levels checked more often if: Your lipid or cholesterol levels are high. You are older than 49 years of age. You are at high risk for heart disease. What should I know about cancer screening? Depending on your health history and family history, you may need to have cancer screening at various ages. This may include screening for: Breast cancer. Cervical cancer. Colorectal cancer. Skin cancer. Lung cancer. What should I know  about heart disease, diabetes, and high blood pressure? Blood pressure and heart disease High blood pressure causes heart disease and increases the risk of stroke. This is more likely to develop in people who have high blood pressure readings or are overweight. Have your blood pressure checked: Every 3-5 years if you are 70-36 years of age. Every year if you are 83 years old or older. Diabetes Have regular diabetes screenings. This checks your fasting blood sugar level. Have the screening done: Once every three years after age 76 if you are at a normal weight and have a low risk for diabetes. More often and at a younger age if you are overweight or have a high risk for diabetes. What should I know about preventing infection? Hepatitis B If you have a higher risk for hepatitis B, you should be screened for this virus. Talk with your health care provider to find out if you are at risk for hepatitis B infection. Hepatitis C Testing is recommended for: Everyone born from [birth year] through 1963. Anyone with known risk factors for hepatitis C. Sexually transmitted infections (STIs) Get screened for STIs, including gonorrhea and chlamydia, if: You are sexually active and are younger than 49 years of age. You are older than 49 years of age and your health care provider tells you that you are at risk for this type of infection. Your sexual activity has changed since you were last screened, and you are at increased risk for chlamydia or gonorrhea. Ask your health care provider if you are at risk. Ask your health care provider about whether you are at high risk for HIV. Your health care provider may recommend a prescription medicine to help prevent HIV infection. If you choose to take medicine to prevent HIV, you should first get tested for HIV. You should then be tested every 3 months for as long as you are taking the medicine. Pregnancy If you are about to stop having your period (premenopausal) and you  may become pregnant, seek counseling before you get pregnant. Take 400 to 800 micrograms (mcg) of folic acid every day if you become pregnant. Ask for birth control (contraception) if you want to prevent pregnancy. Osteoporosis and menopause Osteoporosis is a disease in which the bones lose minerals and strength with aging. This can result in bone fractures. If you are 59 years old or older, or if you are at risk for osteoporosis and fractures, ask your health care provider if you should: Be screened for bone loss. Take a calcium or vitamin D supplement to lower your risk of fractures. Be given hormone replacement therapy (HRT) to treat symptoms of menopause. Follow these instructions at home: Alcohol use Do not drink alcohol if: Your health care provider tells you not to drink. You are pregnant, may be pregnant, or are planning to become pregnant. If you drink alcohol: Limit how much you have to: 0-1 drink  a day. Know how much alcohol is in your drink. In the [place] one drink equals one 12 oz bottle of beer (355 mL), one 5 oz glass of wine (148 mL), or one 1 oz glass of hard liquor (44 mL). Lifestyle Do not use any products that contain nicotine or tobacco. These products include cigarettes, chewing tobacco, and vaping devices, such as e-cigarettes. If you need help quitting, ask your health care provider. Do not use street drugs. Do not share needles. Ask your health care provider for help if you need support or information about quitting drugs. General instructions Schedule regular health, dental, and eye exams. Stay current with your vaccines. Tell your health care provider if: You often feel depressed. You have ever been abused or do not feel safe at home. Summary Adopting a healthy lifestyle and getting preventive care are important in promoting health and wellness. Follow your health care provider's instructions about healthy diet, exercising, and getting tested or screened for  diseases. Follow your health care provider's instructions on monitoring your cholesterol and blood pressure. This information is not intended to replace advice given to you by your health care provider. Make sure you discuss any questions you have with your health care provider. Document Revised: 09/20/2020 Document Reviewed: 09/20/2020 Elsevier Patient Education  [place].

## 2021-07-14 ENCOUNTER — Telehealth: Payer: Self-pay | Admitting: Adult Health

## 2021-07-14 NOTE — Telephone Encounter (Signed)
Pt called in stating that she provider NP [place] with some insurance paperwork that need to be filled out. Pt stated that she never received the insurance paperwork back. Pt is wondering when the paperwork will be filled out so she can come pick it up. Pt requesting callback  ?

## 2021-07-18 ENCOUNTER — Other Ambulatory Visit: Payer: Self-pay | Admitting: Adult Health

## 2021-07-18 DIAGNOSIS — E119 Type 2 diabetes mellitus without complications: Secondary | ICD-10-CM

## 2021-07-18 MED ORDER — DAPAGLIFLOZIN PROPANEDIOL 10 MG PO TABS: 10.0000 mg | ORAL_TABLET | Freq: Every day | ORAL | 0 refills | Status: DC

## 2021-07-18 NOTE — Progress Notes (Signed)
A1C is not controlled with her Metformin 1,000 mg BID and Farxiga 5 mg once daily. Increasing Farxiga to 10 mg once daily. I recommend her seeing endocrinology only [facility] taking new patients for endocrine, ok to place for uncontrolled diabetes if she is in agreement.  Schedule follow up in office in 1 month with her new provider for follow up on diabetes.  Cholesterol shows triglycerides very elevated, this should improve with better glucose control. HDL good cholesterol is low, diet changes and exercise can help with this. Direct LDL good on Crestor.  CMP ok other than glucose not controlled, ALT improved.  CBC is within normal limits.  TSH is within normal limits.  Vitamin D is low end normal and would advise taking vitamin D 3 at 4,000 international units once daily, have vitamin D rechecked in 3 months.  B12 still low end normal- recommend b 12 supplement over the counter at 2,000 micrograms once daily and rechecking B 12 in 3 months as well.

## 2021-07-18 NOTE — Progress Notes (Signed)
No orders of the defined types were placed in this encounter. ? ?Meds ordered this encounter  ?Medications  ? dapagliflozin propanediol (FARXIGA) 10 MG TABS tablet  ?  Sig: Take 1 tablet (10 mg total) by mouth daily before breakfast.  ?  Dispense:  60 tablet  ?  Refill:  0  ?  ?Orders Placed This Encounter  ?Procedures  ? Comprehensive metabolic panel  ?  Standing Status:   Future  ?  Standing Expiration Date:   10/18/2021  ?  ?

## 2021-07-19 ENCOUNTER — Other Ambulatory Visit: Payer: Self-pay

## 2021-07-19 DIAGNOSIS — E119 Type 2 diabetes mellitus without complications: Secondary | ICD-10-CM

## 2021-07-20 ENCOUNTER — Other Ambulatory Visit: Payer: Self-pay

## 2021-07-20 ENCOUNTER — Encounter: Payer: Self-pay | Admitting: Student in an Organized Health Care Education/Training Program

## 2021-07-20 ENCOUNTER — Ambulatory Visit (HOSPITAL_BASED_OUTPATIENT_CLINIC_OR_DEPARTMENT_OTHER): Payer: Managed Care, Other (non HMO) | Admitting: Student in an Organized Health Care Education/Training Program

## 2021-07-20 ENCOUNTER — Ambulatory Visit
Admission: RE | Admit: 2021-07-20 | Discharge: 2021-07-20 | Disposition: A | Discharge status: Home / Self Care | Payer: Managed Care, Other (non HMO) | Source: Ambulatory Visit | Attending: Student in an Organized Health Care Education/Training Program | Admitting: Student in an Organized Health Care Education/Training Program

## 2021-07-20 DIAGNOSIS — Z9889 Other specified postprocedural states: Secondary | ICD-10-CM | POA: Insufficient documentation

## 2021-07-20 DIAGNOSIS — M5416 Radiculopathy, lumbar region: Secondary | ICD-10-CM | POA: Diagnosis not present

## 2021-07-20 DIAGNOSIS — G8929 Other chronic pain: Secondary | ICD-10-CM | POA: Insufficient documentation

## 2021-07-20 DIAGNOSIS — G894 Chronic pain syndrome: Secondary | ICD-10-CM | POA: Diagnosis present

## 2021-07-20 MED ORDER — DEXAMETHASONE SODIUM PHOSPHATE 10 MG/ML IJ SOLN
10.0000 mg | Freq: Once | INTRAMUSCULAR | Status: AC
  Administered 2021-07-20: 10 mg

## 2021-07-20 MED ORDER — SODIUM CHLORIDE 0.9% FLUSH
2.0000 mL | Freq: Once | INTRAVENOUS | Status: AC
  Administered 2021-07-20: 2 mL

## 2021-07-20 MED ORDER — SODIUM CHLORIDE (PF) 0.9 % IJ SOLN
INTRAMUSCULAR | Status: AC
Start: 2021-07-20 — End: ?
  Filled 2021-07-20: qty 10

## 2021-07-20 MED ORDER — ROPIVACAINE HCL 2 MG/ML IJ SOLN
2.0000 mL | Freq: Once | INTRAMUSCULAR | Status: AC
  Administered 2021-07-20: 2 mL via EPIDURAL

## 2021-07-20 MED ORDER — DEXAMETHASONE SODIUM PHOSPHATE 10 MG/ML IJ SOLN
INTRAMUSCULAR | Status: AC
Start: 2021-07-20 — End: ?
  Filled 2021-07-20: qty 1

## 2021-07-20 MED ORDER — IOHEXOL 180 MG/ML  SOLN
10.0000 mL | Freq: Once | INTRAMUSCULAR | Status: AC
  Administered 2021-07-20: 5 mL via EPIDURAL

## 2021-07-20 MED ORDER — ROPIVACAINE HCL 2 MG/ML IJ SOLN
INTRAMUSCULAR | Status: AC
  Filled 2021-07-20: qty 20

## 2021-07-20 MED ORDER — LIDOCAINE HCL (PF) 2 % IJ SOLN
INTRAMUSCULAR | Status: AC
  Filled 2021-07-20: qty 10

## 2021-07-20 MED ORDER — LIDOCAINE HCL 2 % IJ SOLN
20.0000 mL | Freq: Once | INTRAMUSCULAR | Status: AC
  Administered 2021-07-20: 400 mg

## 2021-07-20 NOTE — Progress Notes (Signed)
Safety precautions to be maintained throughout the outpatient stay will include: orient to surroundings, keep bed in low position, maintain call bell within reach at all times, provide assistance with transfer out of bed and ambulation.  

## 2021-07-20 NOTE — Patient Instructions (Signed)

## 2021-07-20 NOTE — Progress Notes (Signed)
Patient's Name: Sherry Hobbs  MRN: [id number]  Referring Provider: Gillis Santa, MD  DOB: 20-Oct-1970  PCP: Doreen Beam, FNP  DOS: 07/20/2021  Note by: Gillis Santa, MD  Service setting: Ambulatory outpatient  Specialty: Interventional Pain Management  Patient type: Established  Location: [facility] (AMB) Pain Management Facility  Visit type: Interventional Procedure   Primary Reason for Visit: Interventional Pain Management Treatment. CC: Sciatica (left)  Procedure:          Anesthesia, Analgesia, Anxiolysis:  Type: Therapeutic Inter-Laminar Epidural Steroid Injection  #3 (#2 done  02/16/20) Region: Lumbar Level: L5-S1 Level. Laterality: Left-Sided         Type: Local Anesthesia  Local Anesthetic: Lidocaine 1-2%  Position: Prone with head of the table was raised to facilitate breathing.   Indications: 1. Chronic radicular lumbar pain   2. Lumbar radiculopathy   3. History of lumbar surgery   4. Chronic pain syndrome    History of L4-L5 laminectomy for synovial cyst now having left leg pain related to left L4-L5 radiculopathy.  Avoid L4-L5 due to laminectomy.  Pain Score: Pre-procedure: 4 /10 Post-procedure: 4  (standing/walking/moving)/10    Pre-op Assessment:  Sherry Hobbs is a 51 y.o. (year old), female patient, seen today for interventional treatment. She  has a past surgical history that includes Dilation and curettage of uterus (05/03/2016); Dilatation & currettage/hysteroscopy with novasure ablation (N/A, 12/03/2017); Lumbar laminectomy/decompression microdiscectomy (Left, 06/24/2018); Back surgery; Laparoscopic assisted vaginal hysterectomy (N/A, 07/07/2019); Laparoscopic bilateral salpingectomy (Bilateral, 07/07/2019); Abdominal hysterectomy; and Colonoscopy with propofol (N/A, 09/27/2020). Ms. Sorter has a current medication list which includes the following prescription(s): ascorbic acid, cholecalciferol, dapagliflozin propanediol, lisinopril, magnesium oxide, metformin,  methocarbamol, nortriptyline, omeprazole, pregabalin, rosuvastatin, gabapentin, and pregabalin. Her primarily concern today is the Sciatica (left)  Initial Vital Signs:  Pulse/HCG Rate: 88  Temp: (!) 97.2 F (36.2 C) Resp: 18 BP: (!) 149/92 SpO2: 100 %  BMI: Estimated body mass index is 34.39 kg/m as calculated from the following:   Height as of this encounter: '5\' 2"'$  (1.575 m).   Weight as of this encounter: 188 lb (85.3 kg).  Risk Assessment: Allergies: Reviewed. She is allergic to codeine.  Allergy Precautions: None required Coagulopathies: Reviewed. None identified.  Blood-thinner therapy: None at this time Active Infection(s): Reviewed. None identified. Sherry Hobbs is afebrile  Site Confirmation: Sherry Hobbs was asked to confirm the procedure and laterality before marking the site Procedure checklist: Completed Consent: Before the procedure and under the influence of no sedative(s), amnesic(s), or anxiolytics, the patient was informed of the treatment options, risks and possible complications. To fulfill our ethical and legal obligations, as recommended by the American Medical Association's Code of Ethics, I have informed the patient of my clinical impression; the nature and purpose of the treatment or procedure; the risks, benefits, and possible complications of the intervention; the alternatives, including doing nothing; the risk(s) and benefit(s) of the alternative treatment(s) or procedure(s); and the risk(s) and benefit(s) of doing nothing. The patient was provided information about the general risks and possible complications associated with the procedure. These may include, but are not limited to: failure to achieve desired goals, infection, bleeding, organ or nerve damage, allergic reactions, paralysis, and death. In addition, the patient was informed of those risks and complications associated to Spine-related procedures, such as failure to decrease pain; infection (i.e.:  Meningitis, epidural or intraspinal abscess); bleeding (i.e.: epidural hematoma, subarachnoid hemorrhage, or any other type of intraspinal or peri-dural bleeding); organ or nerve damage (i.e.:  Any type of peripheral nerve, nerve root, or spinal cord injury) with subsequent damage to sensory, motor, and/or autonomic systems, resulting in permanent pain, numbness, and/or weakness of one or several areas of the body; allergic reactions; (i.e.: anaphylactic reaction); and/or death. Furthermore, the patient was informed of those risks and complications associated with the medications. These include, but are not limited to: allergic reactions (i.e.: anaphylactic or anaphylactoid reaction(s)); adrenal axis suppression; blood sugar elevation that in diabetics may result in ketoacidosis or comma; water retention that in patients with history of congestive heart failure may result in shortness of breath, pulmonary edema, and decompensation with resultant heart failure; weight gain; swelling or edema; medication-induced neural toxicity; particulate matter embolism and blood vessel occlusion with resultant organ, and/or nervous system infarction; and/or aseptic necrosis of one or more joints. Finally, the patient was informed that Medicine is not an exact science; therefore, there is also the possibility of unforeseen or unpredictable risks and/or possible complications that may result in a catastrophic outcome. The patient indicated having understood very clearly. We have given the patient no guarantees and we have made no promises. Enough time was given to the patient to ask questions, all of which were answered to the patient's satisfaction. Sherry Hobbs has indicated that she wanted to continue with the procedure. Attestation: I, the ordering provider, attest that I have discussed with the patient the benefits, risks, side-effects, alternatives, likelihood of achieving goals, and potential problems during recovery for the  procedure that I have provided informed consent. Date   Time: 07/20/2021  9:35 AM  Pre-Procedure Preparation:  Monitoring: As per clinic protocol. Respiration, ETCO2, SpO2, BP, heart rate and rhythm monitor placed and checked for adequate function Safety Precautions: Patient was assessed for positional comfort and pressure points before starting the procedure. Time-out: I initiated and conducted the "Time-out" before starting the procedure, as per protocol. The patient was asked to participate by confirming the accuracy of the "Time Out" information. Verification of the correct person, site, and procedure were performed and confirmed by me, the nursing staff, and the patient. "Time-out" conducted as per Joint Commission's Universal Protocol (UP.01.01.01). Time: 1004  Description of Procedure:          Target Area: The interlaminar space, initially targeting the lower laminar border of the superior vertebral body. Approach: Paramedial approach. Area Prepped: Entire Posterior Lumbar Region Prepping solution: DuraPrep (Iodine Povacrylex [0.7% available iodine] and Isopropyl Alcohol, 74% w/w) Safety Precautions: Aspiration looking for blood return was conducted prior to all injections. At no point did we inject any substances, as a needle was being advanced. No attempts were made at seeking any paresthesias. Safe injection practices and needle disposal techniques used. Medications properly checked for expiration dates. SDV (single dose vial) medications used. Description of the Procedure: Protocol guidelines were followed. The procedure needle was introduced through the skin, ipsilateral to the reported pain, and advanced to the target area. Bone was contacted and the needle walked caudad, until the lamina was cleared. The epidural space was identified using loss-of-resistance technique with 2-3 ml of PF-NaCl (0.9% NSS), in a 5cc LOR glass syringe.  Vitals:   07/20/21 0940 07/20/21 0955 07/20/21 1005  07/20/21 1010  BP: (!) 149/92 (!) 151/85 132/88 (!) 148/85  Pulse: 88 79 84 82  Resp: 18 (!) 23 (!) 21 20  Temp: (!) 97.2 F (36.2 C)     TempSrc: Temporal     SpO2: 100% 100% 99% 100%  Weight: 188 lb (85.3 kg)  Height: '5\' 2"'$  (1.575 m)       Start Time: 1004 hrs. End Time: 1009 hrs.  Materials:  Needle(s) Type: Epidural needle Gauge: 22G Length: 3.5-in Medication(s): Please see orders for medications and dosing details. 6cc solution made of 3 cc of preservative-free saline, 2 cc of 0.2% ropivacaine, 1 cc of Decadron 10 mg/cc.  Imaging Guidance (Spinal):          Type of Imaging Technique: Fluoroscopy Guidance (Spinal) Indication(s): Assistance in needle guidance and placement for procedures requiring needle placement in or near specific anatomical locations not easily accessible without such assistance. Exposure Time: Please see nurses notes. Contrast: Before injecting any contrast, we confirmed that the patient did not have an allergy to iodine, shellfish, or radiological contrast. Once satisfactory needle placement was completed at the desired level, radiological contrast was injected. Contrast injected under live fluoroscopy. No contrast complications. See chart for type and volume of contrast used. Fluoroscopic Guidance: I was personally present during the use of fluoroscopy. "Tunnel Vision Technique" used to obtain the best possible view of the target area. Parallax error corrected before commencing the procedure. "Direction-depth-direction" technique used to introduce the needle under continuous pulsed fluoroscopy. Once target was reached, antero-posterior, oblique, and lateral fluoroscopic projection used confirm needle placement in all planes. Images permanently stored in EMR. Interpretation: I personally interpreted the imaging intraoperatively. Adequate needle placement confirmed in multiple planes. Appropriate spread of contrast into desired area was observed. No evidence of  afferent or efferent intravascular uptake. No intrathecal or subarachnoid spread observed. Permanent images saved into the patient's record.    Post-operative Assessment:  Post-procedure Vital Signs:  Pulse/HCG Rate: 82  Temp: (!) 97.2 F (36.2 C) Resp: 20 BP: (!) 148/85 SpO2: 100 %  EBL: None  Complications: No immediate post-treatment complications observed by team, or reported by patient.  Note: The patient tolerated the entire procedure well. A repeat set of vitals were taken after the procedure and the patient was kept under observation following institutional policy, for this type of procedure. Post-procedural neurological assessment was performed, showing return to baseline, prior to discharge. The patient was provided with post-procedure discharge instructions, including a section on how to identify potential problems. Should any problems arise concerning this procedure, the patient was given instructions to immediately contact us, at any time, without hesitation. In any case, we plan to contact the patient by telephone for a follow-up status report regarding this interventional procedure.  Comments:  No additional relevant information.  Plan of Care  Orders:  Orders Placed This Encounter  Procedures   DG PAIN CLINIC C-ARM 1-60 MIN NO REPORT    Intraoperative interpretation by procedural physician at [place].    Standing Status:   Standing    Number of Occurrences:   1    Order Specific Question:   Reason for exam:    Answer:   Assistance in needle guidance and placement for procedures requiring needle placement in or near specific anatomical locations not easily accessible without such assistance.   Medications ordered for procedure: Meds ordered this encounter  Medications   iohexol (OMNIPAQUE) 180 MG/ML injection 10 mL    Must be Myelogram-compatible. If not available, you may substitute with a water-soluble, non-ionic, hypoallergenic,  myelogram-compatible radiological contrast medium.   lidocaine (XYLOCAINE) 2 % (with pres) injection 400 mg   ropivacaine (PF) 2 mg/mL (0.2%) (NAROPIN) injection 2 mL   sodium chloride flush (NS) 0.9 % injection 2 mL   dexamethasone (DECADRON) injection 10  mg   Medications administered: We administered iohexol, lidocaine, ropivacaine (PF) 2 mg/mL (0.2%), sodium chloride flush, and dexamethasone.  See the medical record for exact dosing, route, and time of administration.  Follow-up plan:   Return in about 6 weeks (around 08/31/2021) for Post Procedure Evaluation, virtual.      History of L4-5 laminectomy with Dr. Lacinda Axon due to synovial cyst now having left L4-L5 radicular pain status post L5-S1 ESI on the left on 03/05/2019, 02/16/20, 07/20/21   Recent Visits Date Type Provider Dept  06/28/21 Office Visit Gillis Santa, MD [facility] recent visits within past 90 days and meeting all other requirements Today's Visits Date Type Provider Dept  07/20/21 Procedure visit Gillis Santa, MD [facility]  Showing today's visits and meeting all other requirements Future Appointments Date Type Provider Dept  08/24/21 Appointment Gillis Santa, MD [facility]  Showing future appointments within next 90 days and meeting all other requirements  Disposition: Discharge home  Discharge Date & Time: 07/20/2021; 1015 hrs.   Primary Care Physician: Doreen Beam, FNP Location: [facility] Outpatient [facility] Management Facility Note by: Gillis Santa, MD Date: 07/20/2021; Time: 10:23 AM  Disclaimer:  Medicine is not an exact science. The only guarantee in medicine is that nothing is guaranteed. It is important to note that the decision to proceed with this intervention was based on the information collected from the patient. The Data and conclusions were drawn from the patient's questionnaire, the interview, and the physical examination. Because the information was provided in  large part by the patient, it cannot be guaranteed that it has not been purposely or unconsciously manipulated. Every effort has been made to obtain as much relevant data as possible for this evaluation. It is important to note that the conclusions that lead to this procedure are derived in large part from the available data. Always take into account that the treatment will also be dependent on availability of resources and existing treatment guidelines, considered by other Pain Management Practitioners as being common knowledge and practice, at the time of the intervention. For Medico-Legal purposes, it is also important to point out that variation in procedural techniques and pharmacological choices are the acceptable norm. The indications, contraindications, technique, and results of the above procedure should only be interpreted and judged by a Board-Certified Interventional Pain Specialist with extensive familiarity and expertise in the same exact procedure and technique.

## 2021-07-21 ENCOUNTER — Telehealth: Payer: Self-pay | Admitting: *Deleted

## 2021-07-21 NOTE — Telephone Encounter (Signed)
Post procedure call;  voicemail left with patient to call the office if there are any questions or concerns.  ?

## 2021-07-22 ENCOUNTER — Ambulatory Visit: Payer: Managed Care, Other (non HMO) | Admitting: Podiatry

## 2021-07-29 ENCOUNTER — Ambulatory Visit: Payer: Managed Care, Other (non HMO) | Admitting: Podiatry

## 2021-08-24 ENCOUNTER — Encounter: Payer: Self-pay | Admitting: Student in an Organized Health Care Education/Training Program

## 2021-08-24 ENCOUNTER — Ambulatory Visit
Payer: Managed Care, Other (non HMO) | Attending: Student in an Organized Health Care Education/Training Program | Admitting: Student in an Organized Health Care Education/Training Program

## 2021-08-24 DIAGNOSIS — G8929 Other chronic pain: Secondary | ICD-10-CM

## 2021-08-24 DIAGNOSIS — M5416 Radiculopathy, lumbar region: Secondary | ICD-10-CM

## 2021-08-24 DIAGNOSIS — Z9889 Other specified postprocedural states: Secondary | ICD-10-CM

## 2021-08-24 DIAGNOSIS — G894 Chronic pain syndrome: Secondary | ICD-10-CM

## 2021-08-24 NOTE — Progress Notes (Signed)
Patient: Sherry Hobbs  Service Category: E/M  Provider: Gillis Santa, MD  ?DOB: Jun 03, 1970  DOS: 08/24/2021  Location: Office  ?MRN: [id number]  Setting: Ambulatory outpatient  Referring Provider: Doreen Beam, F*  ?Type: Established Patient  Specialty: Interventional Pain Management  PCP: Doreen Beam, FNP  ?Location: Remote location  Delivery: TeleHealth    ? ?Virtual Encounter - Pain Management ?PROVIDER NOTE: Information contained herein reflects review and annotations entered in association with encounter. Interpretation of such information and data should be left to medically-trained personnel. Information provided to patient can be located elsewhere in the medical record under "Patient Instructions". Document created using STT-dictation technology, any transcriptional errors that may result from process are unintentional.  ?  ?Contact & Pharmacy ?Preferred: [phone] ?Home: [phone] (home) ?Mobile: [phone] (mobile) ?E-mail: [email or web address]  ?[id number] [address] [place]?Phone: [phone]: [phone]? ?[place] [place]?Phone: [phone]: [phone]?  ?Pre-screening  ?[name] offered "in-person" vs "virtual" encounter. She indicated preferring virtual for this encounter.  ? ?Reason ?COVID-19*  Social distancing based on CDC and AMA recommendations.  ? ?I contacted CLewis Moccasinon 08/24/2021 via telephone.      I clearly identified myself as BGillis Santa MD. I verified that I was speaking with the correct person using two identifiers (Name: CMARTIZA SPETH and date of birth: [birth year]. ? ?Consent ?I sought verbal advanced consent from CLewis Moccasinfor virtual visit interactions. I informed Ms. Aybar of possible security and privacy concerns, risks, and limitations associated with providing  "not-in-person" medical evaluation and management services. I also informed Ms. Birks of the availability of "in-person" appointments. Finally, I informed her that there would be a charge for the virtual visit and that she could be  personally, fully or partially, financially responsible for it. Ms. BWechterexpressed understanding and agreed to proceed.  ? ?Historic Elements   ?Ms. Sherry MDEMIANA CRUMBLEYis a 51y.o. year old, female patient evaluated today after our last contact on 07/20/2021. Sherry Hobbs has a past medical history of Anemia, Arthritis, Depression, [place], [place] (08[place]07[place]), Dysuria (12/01/2019), Elevated serum creatinine, GERD (gastroesophageal reflux disease), Headache, History of blood transfusion (05/02/2016), HTN (hypertension) (08/07/2019), Hypertension, and Neuropathy. She also  has a past surgical history that includes Dilation and curettage of uterus (05/03/2016); Dilatation & currettage/hysteroscopy with novasure ablation (N/A, 12/03/2017); Lumbar laminectomy/decompression microdiscectomy (Left, 06/24/2018); Back surgery; Laparoscopic assisted vaginal hysterectomy (N/A, 07/07/2019); Laparoscopic bilateral salpingectomy (Bilateral, 07/07/2019); Abdominal hysterectomy; and Colonoscopy with propofol (N/A, 09/27/2020). Ms. BMeindershas a current medication list which includes the following prescription(s): ascorbic acid, cholecalciferol, dapagliflozin propanediol, lisinopril, magnesium oxide, metformin, methocarbamol, nortriptyline, omeprazole, pregabalin, pregabalin, rosuvastatin, and gabapentin. She  reports that she has never smoked. She has never used smokeless tobacco. She reports current alcohol use. She reports that she does not use drugs. Ms. BOberryis allergic to codeine.  ? ?HPI  ?Today, she is being contacted for a post-procedure assessment. ? ? ?Post-procedure evaluation  ? ?Type: Therapeutic Inter-Laminar Epidural Steroid Injection  #3  (#2 done  02/16/20) ?Region: Lumbar ?Level: L5-S1 Level. ?Laterality: Left-Sided       ? ?Effectiveness:  ?Initial hour after procedure: 100 %  ?Subsequent 4-6 hours post-procedure: 100 %  ?Analgesia past initial 6 hours: 85 % (ongoing)  ?Ongoing improvement:  ?  Analgesic:  85% ?Function: Sherry Hobbs reports improvement in function ?ROM: Sherry Hobbs reports improvement in ROM ? ? ?Laboratory Chemistry Profile  ? ?Renal ?Lab Results  ?Component Value Date  ? BUN 14 07/12/2021  ? CREATININE 1.07 07/12/2021  ? BCR 11 08/12/2020  ? GFR 61.55 07/12/2021  ? GFRAA >60 11/21/2019  ? GFRNONAA >60 11/21/2019  ?  Hepatic ?Lab Results  ?Component Value Date  ? AST 37 07/12/2021  ? ALT 36 (H) 07/12/2021  ? ALBUMIN 3.9 07/12/2021  ? ALKPHOS 97 07/12/2021  ? HCVAB <0.1 03/30/2016  ? LIPASE 34 11/21/2019  ?  ?Electrolytes ?Lab Results  ?Component Value Date  ? NA 139 07/12/2021  ? K 4.3 07/12/2021  ? CL 102 07/12/2021  ? CALCIUM 9.5 07/12/2021  ?  Bone ?Lab Results  ?Component Value Date  ? VD25OH 33.79 07/12/2021  ?  ?Inflammation (CRP: Acute Phase) (ESR: Chronic Phase) ?No results found for: CRP, ESRSEDRATE, LATICACIDVEN    ?  ? ?Note: Above Lab results reviewed. ? ? ?Assessment  ?The primary encounter diagnosis was Chronic radicular lumbar pain. Diagnoses of Lumbar radiculopathy, History of lumbar surgery, and Chronic pain syndrome were also pertinent to this visit. ? ?Plan of Care  ?Functional and analgesic benefit after left L5-S1 ESI.  Repeat as needed. ? ?Sherry Hobbs has a current medication list which includes the following long-term medication(s): lisinopril, metformin, nortriptyline, omeprazole, and rosuvastatin. ? ? ? ?Follow-up plan:   ?Return if symptoms worsen or fail to improve.   ?  ?History of L4-5 laminectomy with Dr. Lacinda Axon due to synovial cyst now having left L4-L5 radicular pain status post L5-S1 ESI on the left on 03/05/2019, 02/16/20, 07/20/21  ?  ?Recent Visits ?Date Type Provider Dept  ?07/20/21 Procedure  visit Gillis Santa, MD [facility]  ?06/28/21 Office Visit Gillis Santa, MD [facility]  ?Showing recent visits within past 90 days and meeting all other requirements ?Today's Visits ?Date Type Provider Dept  ?08/24/21 Office Visit Gillis Santa, MD [facility]  ?Showing today's visits and meeting all other requirements ?Future Appointments ?No visits were found meeting these conditions. ?Showing future appointments within next 90 days and meeting all other requirements ? ?I discussed the assessment and treatment plan with the patient. The patient was provided an opportunity to ask questions and all were answered. The patient agreed with the plan and demonstrated an understanding of the instructions. ? ?Patient advised to call back or seek an in-person evaluation if the symptoms or condition worsens. ? ?Duration of encounter: 75mnutes. ? ?Note by: BGillis Santa MD ?Date: 08/24/2021; Time: 9:58 AM ?

## 2021-08-29 ENCOUNTER — Telehealth: Payer: Self-pay | Admitting: Adult Health

## 2021-08-29 NOTE — Telephone Encounter (Signed)
Pt called in stating that NP Flinchum had prescribe medication (dapagliflozin propanediol (FARXIGA) 10 MG TABS tablet) to her... Pt stated that she can't afford the medication... Pt was wondering if there is another medication that similar that is cheaper... Pt requesting callback ?

## 2021-09-02 NOTE — Telephone Encounter (Signed)
I called and spoke with patient to offer an appointment with provider here until she could get in to see Romilda Garret, NP. I let her know that there were other options not exactly like [place] but to take but did not know what would be best. Also that referrals can be placed to help those with medication costs IF qualifying. Pt stated that when she called that she was worried bc her feet were swollen not taking the Farxiga, so she did download coupon card that gpt cost to $200. She had already started taking again & her swelling has significantly decreased since doing so. I offered her an appointment here with one of our providers to see her to be sure that this swelling was just medication related. Again, she declined and said that she was establishing with Romilda Garret, NP at [facility] 5/11. I advised that was over 20 days away so if she wanted to be seen sooner for concerns we could certainly do so. I apologized for delay in call & explained that there was no documentation on swelling in patient's feet in message. Pt states that she did tell the front desk when she called.  ?

## 2021-09-19 ENCOUNTER — Encounter: Payer: Self-pay | Admitting: Nurse Practitioner

## 2021-09-20 MED ORDER — METFORMIN HCL 500 MG PO TABS: 1000.0000 mg | ORAL_TABLET | Freq: Two times a day (BID) | ORAL | 0 refills | Status: DC

## 2021-09-23 ENCOUNTER — Ambulatory Visit (INDEPENDENT_AMBULATORY_CARE_PROVIDER_SITE_OTHER): Payer: Managed Care, Other (non HMO) | Admitting: Nurse Practitioner

## 2021-09-23 ENCOUNTER — Encounter: Payer: Self-pay | Admitting: Nurse Practitioner

## 2021-09-23 VITALS — BP 108/76 | HR 90 | Temp 97.1°F | Resp 14 | Ht 62.0 in | Wt 181.4 lb

## 2021-09-23 DIAGNOSIS — K219 Gastro-esophageal reflux disease without esophagitis: Secondary | ICD-10-CM

## 2021-09-23 DIAGNOSIS — E1165 Type 2 diabetes mellitus with hyperglycemia: Secondary | ICD-10-CM | POA: Diagnosis not present

## 2021-09-23 DIAGNOSIS — E559 Vitamin D deficiency, unspecified: Secondary | ICD-10-CM | POA: Diagnosis not present

## 2021-09-23 DIAGNOSIS — G629 Polyneuropathy, unspecified: Secondary | ICD-10-CM

## 2021-09-23 DIAGNOSIS — I10 Essential (primary) hypertension: Secondary | ICD-10-CM

## 2021-09-23 DIAGNOSIS — R911 Solitary pulmonary nodule: Secondary | ICD-10-CM | POA: Diagnosis not present

## 2021-09-23 DIAGNOSIS — E785 Hyperlipidemia, unspecified: Secondary | ICD-10-CM

## 2021-09-23 MED ORDER — OMEPRAZOLE 20 MG PO CPDR: 20.0000 mg | DELAYED_RELEASE_CAPSULE | Freq: Every day | ORAL | 0 refills | Status: DC

## 2021-09-23 NOTE — Patient Instructions (Signed)
Nice to see you today ?I want to see you in 2 months for a recheck and to check your A1C ?Continue taking the medications as prescribed ?If you need anything before the next appointment reach out or reschedule the appointment sooner  ?

## 2021-09-23 NOTE — Assessment & Plan Note (Signed)
Patient currently maintained on Crestor 10 mg.  Continue work on lifestyle modifications, continue taking medication as prescribed. ?

## 2021-09-23 NOTE — Assessment & Plan Note (Signed)
Patient currently being followed by [facility] neurology.  Patient on Lyrica 400 mg daily and nortriptyline nightly.  Continue following up with neurology as recommended take medication as prescribed. ?

## 2021-09-23 NOTE — Assessment & Plan Note (Signed)
Patient currently on over-the-counter vitamin D last vitamin D level was within normal limits. ?

## 2021-09-23 NOTE — Progress Notes (Signed)
? ?New Patient Office Visit ? ?Subjective   ? ?Patient ID: Sherry Hobbs, female    DOB: 11-06-1970  Age: 52 y.o. MRN: [id number] ? ?CC:  ?Chief Complaint  ?Patient presents with  ? Transfer of Care  ? Heartburn  ?  Patient was taking Omeprazole before for about 10 days course as per her previous PCP instructions and then stopped but is having heartburn issues without medication and wonders if she needs to just go back on it for longer. Omeprazole did help with symptoms when she was taking this  ? ? ?HPI ?Sherry LENNYX VERDELL presents to establish care/transfer care ? ?HTN: linsiopril daily. Does well with the medicatoin. Does not check it at home but has a cuff if needed ? ?Lung nodule: evaluated by Dr. Patsey Berthold and [facility] found benign by CT scan ? ?DM: States that she will check her glucose at home. Checks daily but not every day. States that she will check it when she feels bad. States that she takes metformoin and farxgia ? ?Neuropathy: [facility] neurology. Every 3 months pregabalin '100mg'$  1 qam 1 at lunch and 2 at bedtime total  '400mg'$  ? ?HLD: crestor. Small breakfast Yogurt and garnola. Will eat lunch that is made (sandwich, chips and 1 sida a day), supper at home is cooked or eat out. Water through out the day.  ?Exercsie: started walking at home. 3  times a week at 20 mins a day ? ?Vitamin D: states that she is on 1,000 IU daily ? ?Heartburn: States that she did 10 days of omeprazole and did well. State that she has to avoid sauces lots of water can flare it up. Worse when she lays down in the evening. ? ?Outpatient Encounter Medications as of 09/23/2021  ?Medication Sig  ? cholecalciferol (VITAMIN D3) 25 MCG (1000 UNIT) tablet Take 1,000 Units by mouth daily.  ? dapagliflozin propanediol (FARXIGA) 10 MG TABS tablet Take 1 tablet (10 mg total) by mouth daily before breakfast.  ? lisinopril (ZESTRIL) 10 MG tablet Take 1 tablet (10 mg total) by mouth every morning.  ? metFORMIN (GLUCOPHAGE) 500 MG tablet Take 2 tablets  (1,000 mg total) by mouth 2 (two) times daily with a meal.  ? methocarbamol (ROBAXIN) 750 MG tablet Take 1 tablet (750 mg total) by mouth every 8 (eight) hours as needed for muscle spasms.  ? nortriptyline (PAMELOR) 10 MG capsule Take 10 mg by mouth at bedtime. Increase to 20 mg after 1 week.  ? pregabalin (LYRICA) 100 MG capsule Take 100 mg by mouth See admin instructions. 100 mg QAM, 100 mg mid day 200 mg QHS  ? rosuvastatin (CRESTOR) 10 MG tablet Take 1 tablet (10 mg total) by mouth daily.  ? vitamin B-12 (CYANOCOBALAMIN) 1000 MCG tablet Take 1,000 mcg by mouth daily.  ? omeprazole (PRILOSEC) 20 MG capsule Take 1 capsule (20 mg total) by mouth daily.  ? [DISCONTINUED] Ascorbic Acid (VITAMIN C PO) Take by mouth.  ? [DISCONTINUED] gabapentin (NEURONTIN) 100 MG capsule Take 200 mg by mouth 2 (two) times daily. (Patient not taking: Reported on 07/20/2021)  ? [DISCONTINUED] MAGNESIUM OXIDE PO Take by mouth.  ? [DISCONTINUED] omeprazole (PRILOSEC) 20 MG capsule Take 1 capsule (20 mg total) by mouth daily. (Patient not taking: Reported on 09/23/2021)  ? [DISCONTINUED] pregabalin (LYRICA) 100 MG capsule Take 100 mg by mouth in the morning. Morning, afternoon and 200 mg qhs  ? [DISCONTINUED] pregabalin (LYRICA) 75 MG capsule Take 75 mg by mouth 3 (three) times  daily.  ? ?No facility-administered encounter medications on file as of 09/23/2021.  ? ? ?Past Medical History:  ?Diagnosis Date  ? Anemia   ? problems when having abnmornal uterine bleeding.  ? Arthritis   ? lower back  ? Depression   ? Diabetes mellitus without complication ([place])   ? DM (diabetes mellitus) ([place]) 08/07/2019  ? Dysuria 12/01/2019  ? Elevated serum creatinine   ? GERD (gastroesophageal reflux disease)   ? [place]  NO MEDS  ? Headache   ? occasional migraines  ? History of blood transfusion 05/02/2016  ? due to abnormal uterine bleeding  ? HTN (hypertension) 08/07/2019  ? Hypertension   ? Neuropathy   ? feet  ? ? ?Past Surgical History:  ?Procedure Laterality  Date  ? ABDOMINAL HYSTERECTOMY    ? partial  ? BACK SURGERY    ? COLONOSCOPY WITH PROPOFOL N/A 09/27/2020  ? Procedure: COLONOSCOPY WITH PROPOFOL;  Surgeon: Jonathon Bellows, MD;  Location: [facility] ENDOSCOPY;  Service: Gastroenterology;  Laterality: N/A;  ? DILATION AND CURETTAGE OF UTERUS  05/03/2016  ? Procedure: DILATATION AND CURETTAGE;  Surgeon: [name], MD;  Location: [facility] ORS;  Service: Gynecology;;  ? [place] N/A 12/03/2017  ? Procedure: DILATATION & CURETTAGE/HYSTEROSCOPY WITH MINERVA ABLATION;  Surgeon: [name], MD;  Location: [facility] ORS;  Service: Gynecology;  Laterality: N/A;  MINERVA REP CONTACTED ([place] [phone])  ? LAPAROSCOPIC ASSISTED VAGINAL HYSTERECTOMY N/A 07/07/2019  ? Procedure: LAPAROSCOPIC ASSISTED VAGINAL HYSTERECTOMY;  Surgeon: [name], MD;  Location: [facility] ORS;  Service: Gynecology;  Laterality: N/A;  possible TVH  ? LAPAROSCOPIC BILATERAL SALPINGECTOMY Bilateral 07/07/2019  ? Procedure: LAPAROSCOPIC BILATERAL SALPINGECTOMY;  Surgeon: [name], MD;  Location: [facility] ORS;  Service: Gynecology;  Laterality: Bilateral;  ? LUMBAR LAMINECTOMY/DECOMPRESSION MICRODISCECTOMY Left 06/24/2018  ? Procedure: LUMBAR HEMILAMINECTOMY, FORAMINOTOMY, CYST REMOVAL - 1 LEVEL LEFT L4-5;  Surgeon: [name], MD;  Location: [facility] ORS;  Service: Neurosurgery;  Laterality: Left;  ? ? ?Family History  ?Problem Relation Age of Onset  ? Diabetes Mother   ? COPD Mother   ? Emphysema Mother   ? Osteoporosis Mother   ? Lung cancer Mother 46  ? Breast cancer Mother 72  ? CAD Father   ? Early death Father 77  ? Diabetes Sister   ? Diabetes Sister   ? Nevi Sister   ? Cervical cancer Sister   ? Diabetes Brother   ? CAD Brother   ? Celiac disease Brother   ? Diabetes Brother   ? Heart disease Brother   ? Renal cancer Maternal Uncle   ?     1 uncle  ? Brain cancer Maternal Uncle   ?     different uncle  ? Diabetes Maternal Grandmother   ? Stroke Maternal  Grandmother   ? Bladder Cancer Maternal Grandfather   ? ? ?Social History  ? ?Socioeconomic History  ? Marital status: Married  ?  Spouse name: Gwyndolyn Saxon  ? Number of children: 1  ? Years of education: Not on file  ? Highest education level: Associate degree: academic program  ?Occupational History  ? Occupation: Solicitor  ?Tobacco Use  ? Smoking status: Never  ? Smokeless tobacco: Never  ?Vaping Use  ? Vaping Use: Never used  ?Substance and Sexual Activity  ? Alcohol use: Yes  ?  Comment: occassional once a month wine/liquor 2 drinks  ? Drug use: No  ? Sexual activity: Not Currently  ?  Birth  control/protection: None  ?Other Topics Concern  ? Not on file  ?Social History Narrative  ? Fulltime: works [employer]. Transportation assistance   ?   ? ?Social Determinants of Health  ? ?Financial Resource Strain: Not on file  ?Food Insecurity: Not on file  ?Transportation Needs: Not on file  ?Physical Activity: Not on file  ?Stress: Not on file  ?Social Connections: Not on file  ?Intimate Partner Violence: Not on file  ? ? ?Review of Systems  ?Constitutional:  Negative for chills and fever.  ?Respiratory:  Negative for shortness of breath.   ?Cardiovascular:  Negative for chest pain and leg swelling.  ?Gastrointestinal:  Positive for heartburn. Negative for abdominal pain.  ?Neurological:  Positive for tingling (neuropathy).  ? ?  ? ? ?Objective   ? ?BP 108/76   Pulse 90   Temp (!) 97.1 ?F (36.2 ?C)   Resp 14   Ht '5\' 2"'$  (1.575 m)   Wt 181 lb 6 oz (82.3 kg)   LMP 11/26/2017   SpO2 96%   BMI 33.17 kg/m?  ? ?Physical Exam ?Vitals and nursing note reviewed.  ?Constitutional:   ?   Appearance: Normal appearance. She is obese.  ?HENT:  ?   Right Ear: Ear canal and external ear normal.  ?   Left Ear: Tympanic membrane, ear canal and external ear normal.  ?   Ears:  ?   Comments: Scab/dried blood in the right ear canal ?   Mouth/Throat:  ?   Mouth: Mucous membranes are moist.  ?   Pharynx: Oropharynx is clear.   ?Eyes:  ?   Extraocular Movements: Extraocular movements intact.  ?   Pupils: Pupils are equal, round, and reactive to light.  ?Neck:  ?   Thyroid: No thyroid mass, thyromegaly or thyroid tenderness.  ?Cardiova

## 2021-09-23 NOTE — Assessment & Plan Note (Signed)
Encouraged weight loss and healthy food choices.  We will start patient on omeprazole 20 mg daily for 3 months and then take off to see if this helps with her GERD.  If she has to be on PPI long-term she will need to have an endoscopy ?

## 2021-09-23 NOTE — Assessment & Plan Note (Signed)
Patient currently maintained on metformin 1000 mg twice daily and Farxiga 5 mg daily.  Blood glucose is still above goal from what patient is showing when she does check her glucose.  Encouraged lifestyle modifications will return the office and check A1c likely have to add another agent patient has been referred to endocrinology but does not have an appointment until November ?

## 2021-09-23 NOTE — Assessment & Plan Note (Signed)
Patient currently maintained on lisinopril 10 mg daily.  Continue taking medication as prescribed ?

## 2021-09-23 NOTE — Assessment & Plan Note (Signed)
Patient was seeing Dr. Patsey Berthold, pulmonologist.  Last CT scan was in 2022 that did show a nodule that required no further follow-up.  No longer sees pulmonology ?

## 2021-10-16 ENCOUNTER — Other Ambulatory Visit: Payer: Self-pay | Admitting: Nurse Practitioner

## 2021-11-01 ENCOUNTER — Other Ambulatory Visit: Payer: Self-pay

## 2021-11-01 NOTE — Telephone Encounter (Signed)
Refill request for Sherry Hobbs from [place]

## 2021-11-02 MED ORDER — DAPAGLIFLOZIN PROPANEDIOL 10 MG PO TABS: 10.0000 mg | ORAL_TABLET | Freq: Every day | ORAL | 1 refills | Status: DC

## 2021-11-25 ENCOUNTER — Ambulatory Visit: Payer: Managed Care, Other (non HMO) | Admitting: Nurse Practitioner

## 2021-11-27 IMAGING — CT CT CHEST W/O CM
1 series · 15 of 34 positions shown, 19 images · non-contrast
Comparison: 12/25/2019

CLINICAL DATA: Follow-up lung nodule.

EXAM:
CT CHEST WITHOUT CONTRAST
TECHNIQUE: Multidetector CT imaging of the chest was performed following the
standard protocol without IV contrast.

[Series 2: thorax · axial · 0.66mm/px · z∈[-540,-304]mm · 15 of 140 slices shown, 19 images]
[im 11/140  mediastinal]
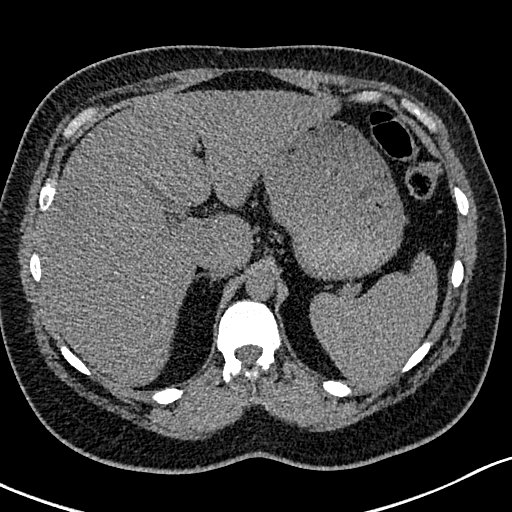
[im 11/140  lung]
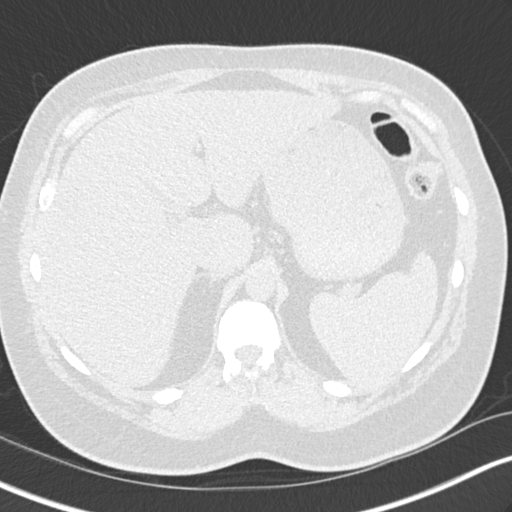
[im 21/140  lung]
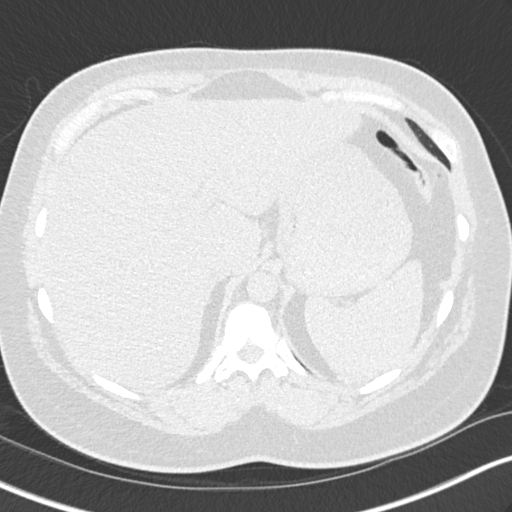
[im 28/140  lung]
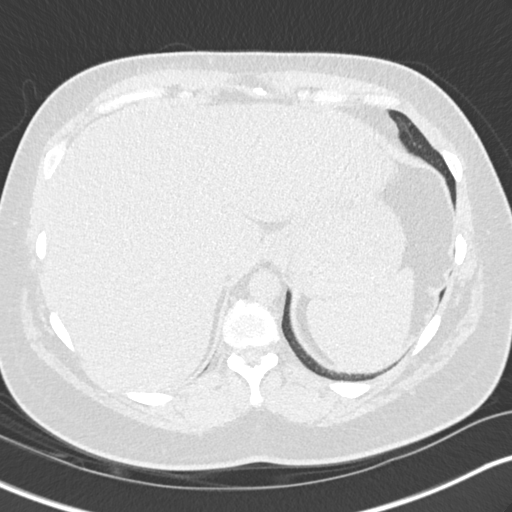
[im 37/140  lung]
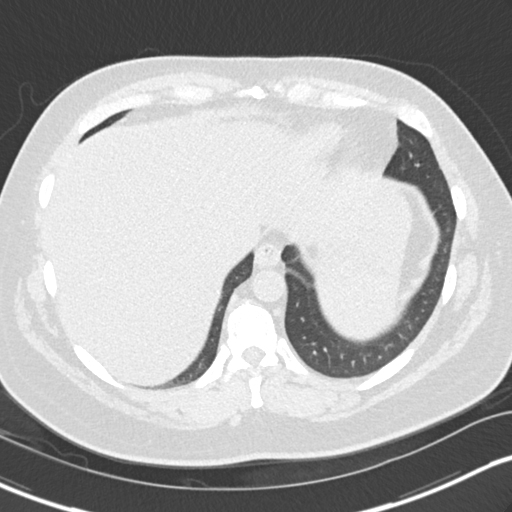
[im 47/140  mediastinal]
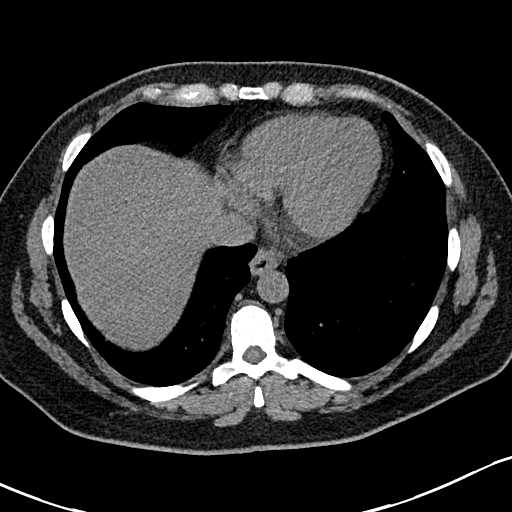
[im 47/140  lung]
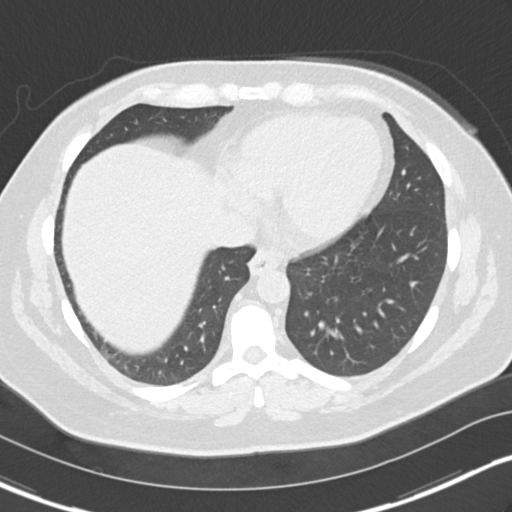
[im 56/140  lung]
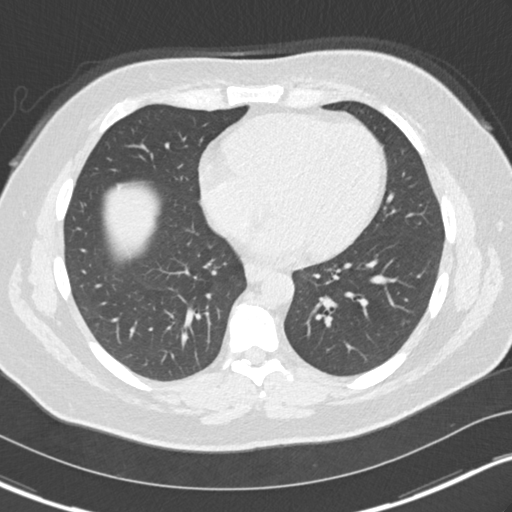
[im 62/140  lung]
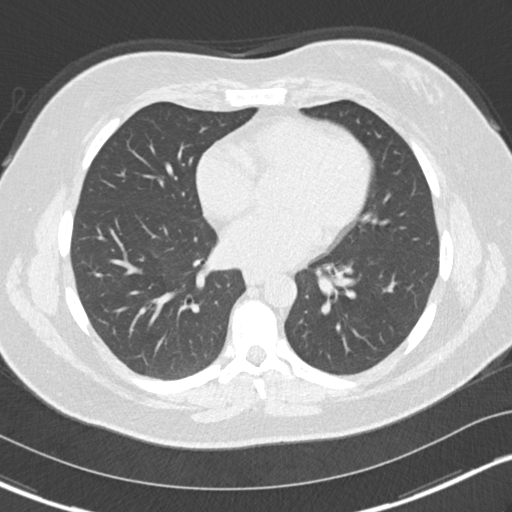
[im 73/140  lung]
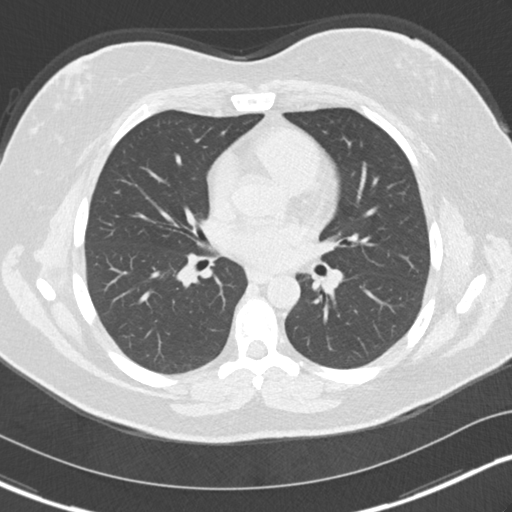
[im 78/140  mediastinal]
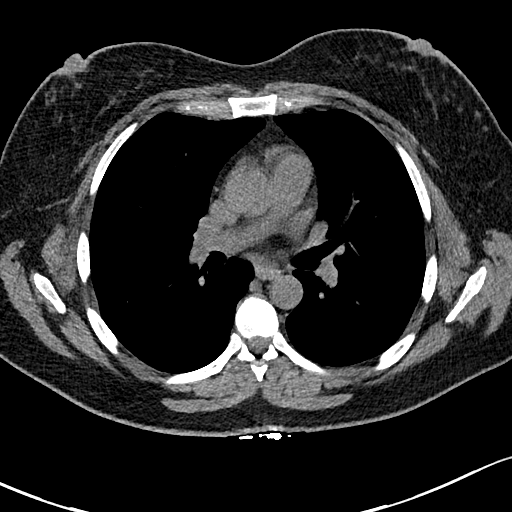
[im 78/140  lung]
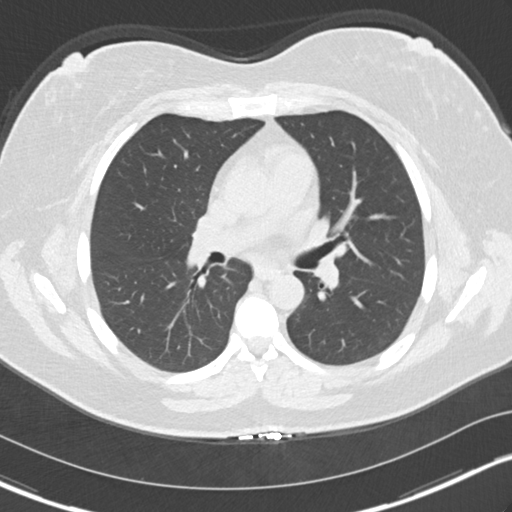
[im 84/140  lung]
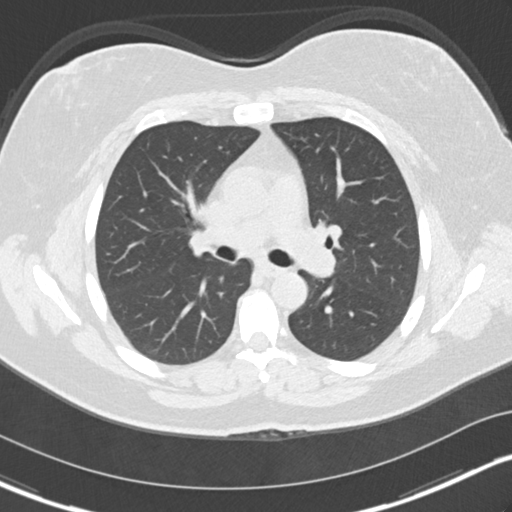
[im 93/140  lung]
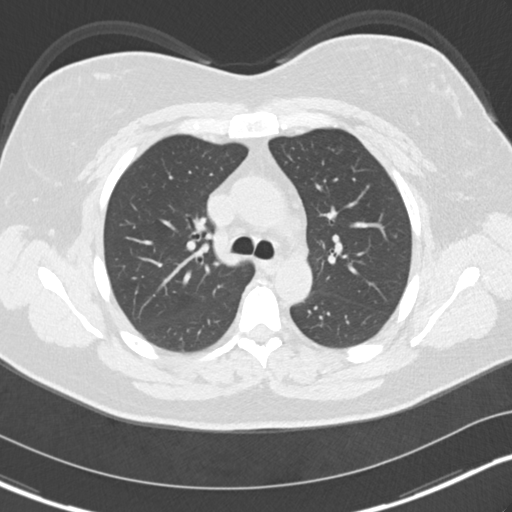
[im 103/140  lung]
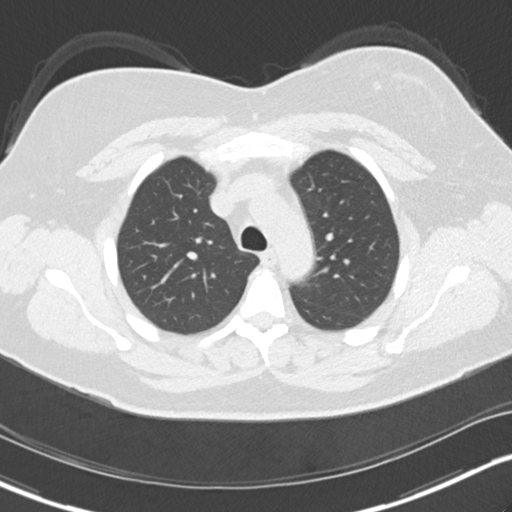
[im 112/140  mediastinal]
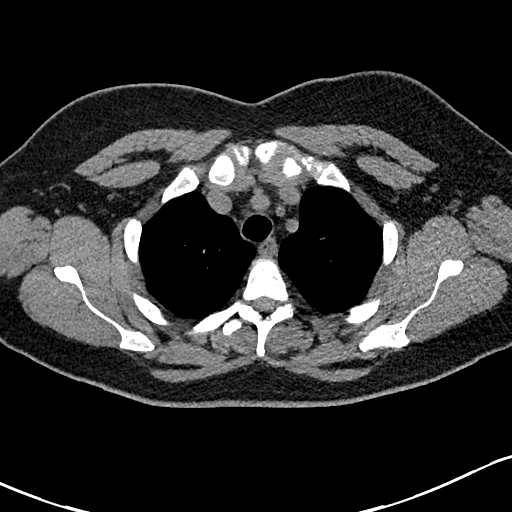
[im 112/140  lung]
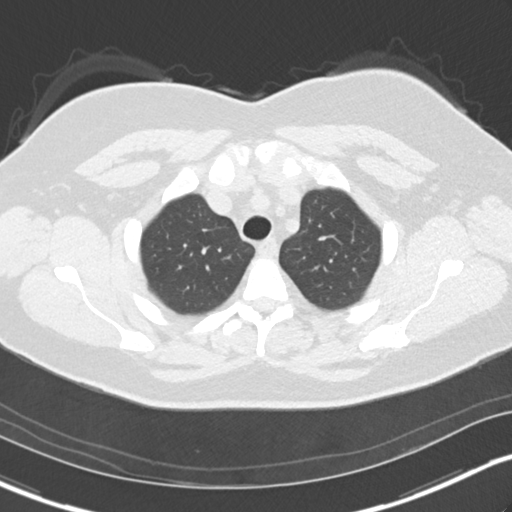
[im 119/140  lung]
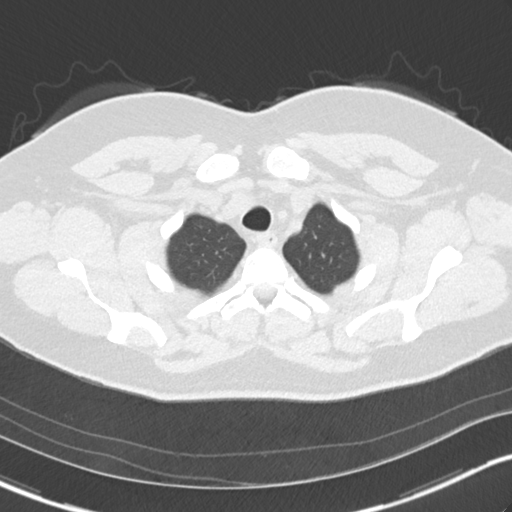
[im 129/140  lung]
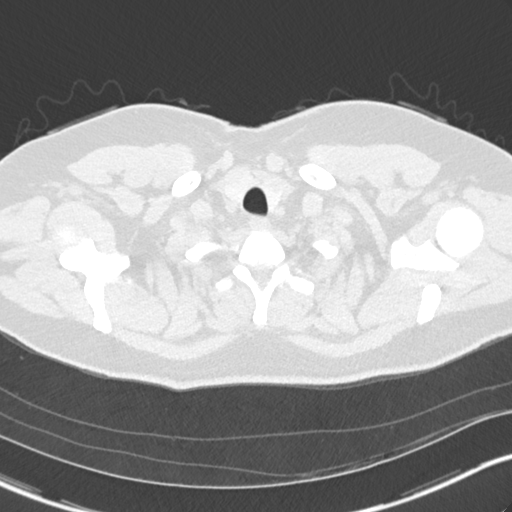

[15 of 34 positions shown; findings below may reference images not displayed]

FINDINGS: Cardiovascular: Heart size normal.  No pericardial effusion.

Mediastinum/Nodes: 1.1 cm left lobe of thyroid gland nodule is
identified, image [DATE]. Not clinically significant; no follow-up
imaging recommended (ref: [HOSPITAL]. [DATE]):
143-50).The trachea appears patent and is midline. Normal appearance
of the esophagus. No enlarged mediastinal or hilar lymph nodes.

Lungs/Pleura: 3 mm perifissural nodule within the right middle lobe
is unchanged compared with previous exam compatible with a benign
abnormality, image 76/3. No additional lung nodules identified.

Upper Abdomen: Mild hepatic steatosis. No acute or suspicious
findings.

Musculoskeletal: No chest wall mass or suspicious bone lesions
identified.
IMPRESSION: 1. Stable 3 mm perifissural nodule within the right middle lobe
compatible with a benign abnormality. No further follow-up is
indicated at this time.
2. Mild hepatic steatosis.
3. 1.1 cm left lobe of thyroid gland nodule is identified. Not
clinically significant; no follow-up imaging recommended (ref: [HOSPITAL]. [DATE]): 143-50).

## 2021-11-30 ENCOUNTER — Ambulatory Visit: Payer: Managed Care, Other (non HMO) | Admitting: Nurse Practitioner

## 2021-11-30 ENCOUNTER — Encounter: Payer: Self-pay | Admitting: Nurse Practitioner

## 2021-11-30 VITALS — BP 136/78 | HR 80 | Temp 96.6°F | Resp 10 | Ht 62.0 in | Wt 184.0 lb

## 2021-11-30 DIAGNOSIS — M545 Low back pain, unspecified: Secondary | ICD-10-CM

## 2021-11-30 DIAGNOSIS — E1165 Type 2 diabetes mellitus with hyperglycemia: Secondary | ICD-10-CM

## 2021-11-30 DIAGNOSIS — E1159 Type 2 diabetes mellitus with other circulatory complications: Secondary | ICD-10-CM

## 2021-11-30 DIAGNOSIS — I152 Hypertension secondary to endocrine disorders: Secondary | ICD-10-CM

## 2021-11-30 DIAGNOSIS — I10 Essential (primary) hypertension: Secondary | ICD-10-CM

## 2021-11-30 DIAGNOSIS — E785 Hyperlipidemia, unspecified: Secondary | ICD-10-CM

## 2021-11-30 DIAGNOSIS — G8929 Other chronic pain: Secondary | ICD-10-CM

## 2021-11-30 LAB — COMPREHENSIVE METABOLIC PANEL
ALT: 38 U/L — ABNORMAL HIGH (ref 0–35)
AST: 28 U/L (ref 0–37)
Albumin: 4.3 g/dL (ref 3.5–5.2)
Alkaline Phosphatase: 97 U/L (ref 39–117)
BUN: 13 mg/dL (ref 6–23)
CO2: 28 mEq/L (ref 19–32)
Calcium: 9.5 mg/dL (ref 8.4–10.5)
Chloride: 103 mEq/L (ref 96–112)
Creatinine, Ser: 1.14 mg/dL (ref 0.40–1.20)
GFR: 56.88 mL/min — ABNORMAL LOW (ref >60.00)
Glucose, Bld: 161 mg/dL — ABNORMAL HIGH (ref 70–99)
Potassium: 4.3 mEq/L (ref 3.5–5.1)
Sodium: 142 mEq/L (ref 135–145)
Total Bilirubin: 0.8 mg/dL (ref 0.2–1.2)
Total Protein: 6.7 g/dL (ref 6.0–8.3)

## 2021-11-30 LAB — CBC
HCT: 39 % (ref 36.0–46.0)
Hemoglobin: 13.1 g/dL (ref 12.0–15.0)
MCHC: 33.6 g/dL (ref 30.0–36.0)
MCV: 83.7 fl (ref 78.0–100.0)
Platelets: 206 10*3/uL (ref 150.0–400.0)
RBC: 4.66 Mil/uL (ref 3.87–5.11)
RDW: 12.9 % (ref 11.5–15.5)
WBC: 7.6 10*3/uL (ref 4.0–10.5)

## 2021-11-30 LAB — LIPID PANEL
Cholesterol: 103 mg/dL (ref 0–200)
HDL: 25 mg/dL — ABNORMAL LOW (ref >39.00)
NonHDL: 77.7
Total CHOL/HDL Ratio: 4
Triglycerides: 291 mg/dL — ABNORMAL HIGH (ref 0.0–149.0)
VLDL: 58.2 mg/dL — ABNORMAL HIGH (ref 0.0–40.0)

## 2021-11-30 LAB — POCT GLYCOSYLATED HEMOGLOBIN (HGB A1C): Hemoglobin A1C: 8.6 % — AB (ref 4.0–5.6)

## 2021-11-30 LAB — LDL CHOLESTEROL, DIRECT: Direct LDL: 42 mg/dL

## 2021-11-30 MED ORDER — BLOOD GLUCOSE METER KIT
PACK | 12 refills | Status: AC
Start: 2021-11-30 — End: ?

## 2021-11-30 MED ORDER — LISINOPRIL 10 MG PO TABS: 10.0000 mg | ORAL_TABLET | ORAL | 1 refills | Status: DC

## 2021-11-30 MED ORDER — GLIPIZIDE ER 5 MG PO TB24: 5.0000 mg | ORAL_TABLET | Freq: Every day | ORAL | 1 refills | Status: DC

## 2021-11-30 NOTE — Progress Notes (Signed)
Established Patient Office Visit  Subjective   Patient ID: Sherry Hobbs, female    DOB: 1970-10-15  Age: 51 y.o. MRN: [id number]  Chief Complaint  Patient presents with   Diabetes    Follow up-does not check sugars often. It was 193 on 11/19/21.      DM2: Patient is currenlty maintained on Metformin and farxiga. Last A1C in office was 8.1. States that she checks her sugar once every other week. States the last time she checked it was the 8th it was 193 Hyperglycemia: 324 Hypoglycemia: none lowest reading was 144 She will eat threes times a day. States breakfast is yogurt and granola. Will have chips, sandwich, and diet soda, and dinner is eating out. Water throughout the day Exercise: none outside of work    GERD: Last office visit patient c/o heart burn symptoms. We did place her on omeprazole at her last office visit.  Been taking medication and states has been beneficial.  Did tell her to finish out the 74-monthcourse of we will take her off medication and see how she fares  History of back surgery: states that she had a cyst on the lumbar spine. States that they removed the cyst and part of the vertebrae.  She does see Dr. LZollie Scaleat pain management he does injections.  Last office note with Dr. LZollie Scalewas March 2023.  Did recommend she follow-up with him      Review of Systems  Constitutional:  Negative for chills and fever.  Respiratory:  Negative for shortness of breath.   Cardiovascular:  Positive for leg swelling. Negative for chest pain.  Genitourinary:  Negative for dysuria and frequency.      Objective:     BP 136/78   Pulse 80   Temp (!) 96.6 F (35.9 C)   Resp 10   Ht '5\' 2"'$  (1.575 m)   Wt 184 lb (83.5 kg)   LMP 11/26/2017   SpO2 99%   BMI 33.65 kg/m  BP Readings from Last 3 Encounters:  11/30/21 136/78  09/23/21 108/76  07/20/21 (!) 148/85   Wt Readings from Last 3 Encounters:  11/30/21 184 lb (83.5 kg)  09/23/21 181 lb 6 oz (82.3 kg)  07/20/21  188 lb (85.3 kg)      Physical Exam Vitals and nursing note reviewed.  Constitutional:      Appearance: Normal appearance.  HENT:     Right Ear: Tympanic membrane, ear canal and external ear normal.     Left Ear: Tympanic membrane, ear canal and external ear normal.  Cardiovascular:     Rate and Rhythm: Normal rate and regular rhythm.     Heart sounds: Normal heart sounds.  Pulmonary:     Effort: Pulmonary effort is normal.     Breath sounds: Normal breath sounds.  Abdominal:     General: Bowel sounds are normal.  Musculoskeletal:        General: Tenderness present.     Lumbar back: Tenderness and bony tenderness present.       Back:     Right lower leg: No edema.     Left lower leg: No edema.  Neurological:     Mental Status: She is alert.      Results for orders placed or performed in visit on 11/30/21  POCT glycosylated hemoglobin (Hb A1C)  Result Value Ref Range   Hemoglobin A1C 8.6 (A) 4.0 - 5.6 %   HbA1c POC (<> result, manual entry)  HbA1c, POC (prediabetic range)     HbA1c, POC (controlled diabetic range)        The ASCVD Risk score (Arnett DK, et al., 2019) failed to calculate for the following reasons:   The valid total cholesterol range is 130 to 320 mg/dL    Assessment & Plan:   Problem List Items Addressed This Visit       Cardiovascular and Mediastinum   HTN (hypertension)    Patient's blood pressure slightly above her baseline.  States has been out of medication approximately 5 to 7 days she go to the pharmacy and had no refills.  She is whether she saw me today to request refill refill sent in today      Relevant Medications   lisinopril (ZESTRIL) 10 MG tablet     Endocrine   DM (diabetes mellitus) ([place]) - Primary    Patient's A1c increased since last office visit.  8.6 in office today.  Did discuss exercise and diet and recommendations thereof.  Patient currently on metformin 1000 mg twice daily along with Farxiga 10 mg daily we will  add on glipizide 5 mg XR.  Did request patient to check glucose at least daily we will send a new order for meter, lancets, test strips.  Did review signs symptoms of hypoglycemia      Relevant Medications   lisinopril (ZESTRIL) 10 MG tablet   glipiZIDE (GLIPIZIDE XL) 5 MG 24 hr tablet   Other Relevant Orders   POCT glycosylated hemoglobin (Hb A1C) (Completed)   CBC   Lipid panel   Comprehensive metabolic panel     Other   Low back pain    Patient states he is followed by Dr. Zollie Scale and pain management.  Has had epidural in the past that lasted approximately 2 years saw him back in March of this year and is already back in pain.  Did inform patient he is to call and discuss with Dr. Zollie Scale for further recommendations and follow-up.      Hyperlipidemia    Patient is fasting today currently on rosuvastatin.  Pending lab results      Relevant Medications   lisinopril (ZESTRIL) 10 MG tablet   Other Relevant Orders   CBC   Lipid panel   Comprehensive metabolic panel   Other Visit Diagnoses     Hypertension associated with diabetes ([place])       Relevant Medications   lisinopril (ZESTRIL) 10 MG tablet   glipiZIDE (GLIPIZIDE XL) 5 MG 24 hr tablet       Return in about 14 weeks (around 03/08/2022) for DM recheck.    Romilda Garret, NP

## 2021-11-30 NOTE — Assessment & Plan Note (Signed)
Patient is fasting today currently on rosuvastatin.  Pending lab results

## 2021-11-30 NOTE — Assessment & Plan Note (Signed)
Patient states he is followed by Dr. Zollie Scale and pain management.  Has had epidural in the past that lasted approximately 2 years saw him back in March of this year and is already back in pain.  Did inform patient he is to call and discuss with Dr. Zollie Scale for further recommendations and follow-up.

## 2021-11-30 NOTE — Assessment & Plan Note (Signed)
Patient's blood pressure slightly above her baseline.  States has been out of medication approximately 5 to 7 days she go to the pharmacy and had no refills.  She is whether she saw me today to request refill refill sent in today

## 2021-11-30 NOTE — Patient Instructions (Signed)
Nice to see you today I want you to check your sugar daily. First thing in the morning.  I have sent in your refill of lisinopril and added on a new sugar medication called Glipizide that you take in the morning. Follow up with me in 3.5 months for a follow up, sooner if you need me

## 2021-11-30 NOTE — Assessment & Plan Note (Signed)
Patient's A1c increased since last office visit.  8.6 in office today.  Did discuss exercise and diet and recommendations thereof.  Patient currently on metformin 1000 mg twice daily along with Farxiga 10 mg daily we will add on glipizide 5 mg XR.  Did request patient to check glucose at least daily we will send a new order for meter, lancets, test strips.  Did review signs symptoms of hypoglycemia

## 2021-12-01 ENCOUNTER — Encounter: Payer: Self-pay | Admitting: Nurse Practitioner

## 2021-12-11 ENCOUNTER — Other Ambulatory Visit: Payer: Self-pay | Admitting: Nurse Practitioner

## 2021-12-19 ENCOUNTER — Encounter: Payer: Self-pay | Admitting: Nurse Practitioner

## 2021-12-20 NOTE — Telephone Encounter (Signed)
An appt to evaluate in person

## 2021-12-21 ENCOUNTER — Ambulatory Visit: Payer: Managed Care, Other (non HMO) | Admitting: Nurse Practitioner

## 2021-12-22 ENCOUNTER — Encounter: Payer: Self-pay | Admitting: Nurse Practitioner

## 2021-12-22 ENCOUNTER — Ambulatory Visit (INDEPENDENT_AMBULATORY_CARE_PROVIDER_SITE_OTHER): Payer: Managed Care, Other (non HMO) | Admitting: Nurse Practitioner

## 2021-12-22 VITALS — BP 110/62 | HR 88 | Temp 97.7°F | Ht 62.0 in | Wt 181.0 lb

## 2021-12-22 DIAGNOSIS — R220 Localized swelling, mass and lump, head: Secondary | ICD-10-CM

## 2021-12-22 NOTE — Patient Instructions (Signed)
Nice to see you today Call the number I gave you and set up the ultrasound at your convenience Follow up with me as scheduled, sooner if you need me

## 2021-12-22 NOTE — Assessment & Plan Note (Signed)
Approximately 1 cm x 1 cm nodule to the center of her left of the forehead.  Does feel very cystic in nature.  Will obtain ultrasound for definitive answer and when placed ambulatory referral to dermatology for potential removal.  Low suspicion for abscess or infection process.

## 2021-12-22 NOTE — Progress Notes (Signed)
   Established Patient Office Visit  Subjective   Patient ID: Sherry Hobbs, female    DOB: [birth year]  Age: 51 y.o. MRN: [id number]  Chief Complaint  Patient presents with   Acute Visit    Knot on forehead x 3 weeks; no pain    HPI  Mass: States that it has been about 3 week. States that it has gotten bigger. Does not hurt, drain, itch. Scar there from years ago  Has used Boil ease and hot compresses that did not help. No injury per patient report     Review of Systems  Constitutional:  Negative for chills and fever.  Respiratory:  Negative for shortness of breath.   Cardiovascular:  Negative for chest pain.  Neurological:  Negative for dizziness and headaches.      Objective:     BP 110/62 (BP Location: Left Arm, Patient Position: Sitting, Cuff Size: Normal)   Pulse 88   Temp 97.7 F (36.5 C) (Temporal)   Ht '5\' 2"'$  (1.575 m)   Wt 181 lb (82.1 kg)   LMP 11/26/2017   SpO2 97%   BMI 33.11 kg/m    Physical Exam Vitals and nursing note reviewed.  Constitutional:      Appearance: Normal appearance.  Skin:    General: Skin is warm.     Findings: Lesion present.          Comments: Approx 1cm by 1cm in center of forehead. Non tender and mobile.   Neurological:     Mental Status: She is alert.      No results found for any visits on 12/22/21.    The ASCVD Risk score (Arnett DK, et al., 2019) failed to calculate for the following reasons:   The valid total cholesterol range is 130 to 320 mg/dL    Assessment & Plan:   Problem List Items Addressed This Visit       Other   Mass of face - Primary    Approximately 1 cm x 1 cm nodule to the center of her left of the forehead.  Does feel very cystic in nature.  Will obtain ultrasound for definitive answer and when placed ambulatory referral to dermatology for potential removal.  Low suspicion for abscess or infection process.      Relevant Orders   US Soft Tissue Head/Neck (NON-THYROID)   Ambulatory  referral to Dermatology    Return if symptoms worsen or fail to improve, for as scheduled.    Romilda Garret, NP

## 2021-12-27 ENCOUNTER — Ambulatory Visit
Admission: RE | Admit: 2021-12-27 | Discharge: 2021-12-27 | Disposition: A | Discharge status: Home / Self Care | Payer: Managed Care, Other (non HMO) | Source: Ambulatory Visit | Attending: Nurse Practitioner | Admitting: Nurse Practitioner

## 2021-12-27 ENCOUNTER — Other Ambulatory Visit: Payer: Self-pay | Admitting: Nurse Practitioner

## 2021-12-27 DIAGNOSIS — R220 Localized swelling, mass and lump, head: Secondary | ICD-10-CM | POA: Insufficient documentation

## 2021-12-28 ENCOUNTER — Encounter: Payer: Self-pay | Admitting: Nurse Practitioner

## 2021-12-28 DIAGNOSIS — R220 Localized swelling, mass and lump, head: Secondary | ICD-10-CM

## 2022-01-06 ENCOUNTER — Other Ambulatory Visit: Payer: Self-pay | Admitting: Nurse Practitioner

## 2022-01-06 DIAGNOSIS — K219 Gastro-esophageal reflux disease without esophagitis: Secondary | ICD-10-CM

## 2022-01-11 ENCOUNTER — Encounter: Payer: Self-pay | Admitting: Nurse Practitioner

## 2022-01-17 ENCOUNTER — Other Ambulatory Visit: Payer: Self-pay | Admitting: Nurse Practitioner

## 2022-01-17 DIAGNOSIS — K219 Gastro-esophageal reflux disease without esophagitis: Secondary | ICD-10-CM

## 2022-01-30 ENCOUNTER — Encounter: Payer: Self-pay | Admitting: Optometrist

## 2022-01-30 LAB — HM DIABETES EYE EXAM

## 2022-03-02 ENCOUNTER — Ambulatory Visit: Payer: Managed Care, Other (non HMO) | Admitting: Nurse Practitioner

## 2022-03-13 ENCOUNTER — Encounter (INDEPENDENT_AMBULATORY_CARE_PROVIDER_SITE_OTHER): Payer: Self-pay

## 2022-03-23 ENCOUNTER — Ambulatory Visit: Payer: Managed Care, Other (non HMO) | Admitting: Nurse Practitioner

## 2022-03-23 VITALS — BP 118/76 | HR 76 | Temp 96.3°F | Resp 14 | Ht 62.0 in | Wt 184.0 lb

## 2022-03-23 DIAGNOSIS — Z23 Encounter for immunization: Secondary | ICD-10-CM | POA: Diagnosis not present

## 2022-03-23 DIAGNOSIS — R7989 Other specified abnormal findings of blood chemistry: Secondary | ICD-10-CM | POA: Diagnosis not present

## 2022-03-23 DIAGNOSIS — R944 Abnormal results of kidney function studies: Secondary | ICD-10-CM | POA: Diagnosis not present

## 2022-03-23 DIAGNOSIS — E1165 Type 2 diabetes mellitus with hyperglycemia: Secondary | ICD-10-CM | POA: Diagnosis not present

## 2022-03-23 DIAGNOSIS — G629 Polyneuropathy, unspecified: Secondary | ICD-10-CM

## 2022-03-23 DIAGNOSIS — K219 Gastro-esophageal reflux disease without esophagitis: Secondary | ICD-10-CM

## 2022-03-23 DIAGNOSIS — I10 Essential (primary) hypertension: Secondary | ICD-10-CM | POA: Diagnosis not present

## 2022-03-23 LAB — POCT GLYCOSYLATED HEMOGLOBIN (HGB A1C): Hemoglobin A1C: 6.5 % — AB (ref 4.0–5.6)

## 2022-03-23 LAB — COMPREHENSIVE METABOLIC PANEL
ALT: 25 U/L (ref 0–35)
AST: 20 U/L (ref 0–37)
Albumin: 4.2 g/dL (ref 3.5–5.2)
Alkaline Phosphatase: 84 U/L (ref 39–117)
BUN: 15 mg/dL (ref 6–23)
CO2: 30 mEq/L (ref 19–32)
Calcium: 9.7 mg/dL (ref 8.4–10.5)
Chloride: 105 mEq/L (ref 96–112)
Creatinine, Ser: 1.18 mg/dL (ref 0.40–1.20)
GFR: 54.46 mL/min — ABNORMAL LOW (ref >60.00)
Glucose, Bld: 105 mg/dL — ABNORMAL HIGH (ref 70–99)
Potassium: 4.5 mEq/L (ref 3.5–5.1)
Sodium: 140 mEq/L (ref 135–145)
Total Bilirubin: 0.6 mg/dL (ref 0.2–1.2)
Total Protein: 7 g/dL (ref 6.0–8.3)

## 2022-03-23 NOTE — Assessment & Plan Note (Signed)
Pending CMP.

## 2022-03-23 NOTE — Patient Instructions (Signed)
Nice to see you today A1C is 6.5! Follow up with me in 3 months, sooner if you need me

## 2022-03-23 NOTE — Assessment & Plan Note (Signed)
Completed a 72-monthtrial of a PPI.  Patient is tolerating reflux and Tums without intervention.  She has had made some dietary modifications.  Did tell patient to get over-the-counter famotidine to use as needed.

## 2022-03-23 NOTE — Assessment & Plan Note (Signed)
Patient currently maintained on lisinopril 10 mg.  Taking medication as prescribed.  Tolerating medication well.  Blood pressure within normal limits.  Continue taking medication as prescribed

## 2022-03-23 NOTE — Progress Notes (Signed)
Established Patient Office Visit  Subjective   Patient ID: Sherry Hobbs, female    DOB: 1970/11/01  Age: 50 y.o. MRN: [id number]  Chief Complaint  Patient presents with   Diabetes    Follow up-checks sugar at home but not regularly for example on 03/21/22 reading was 151 at 8 am and 86 at 2:51 pm       DM2: States that she will check her sugars but it is hit and miss. States that she done well for awhile and then towards the end of sept. Was checking daily or every other day. States after that 4 times in October that she has checked her sugar 86 low 244 high States that she will walk 3 times a week and will do it approx 20 mins at a time. Diet: healthy choices. States she is doing 3 meals a day and will snack more in between lunch and dinner. With apples and tangerines   HTN: Patient currently maintained on lisinopril 10 mg.  Tolerating medication well.  Blood pressure within normal limits today.  GERD: States that she has completed the course of omeprazole. Has noticed that certain flavoiring in the water can exacerbate her heartburn.  Doing well overall in regards to heartburn.  We will discontinue the omeprazole  Mass on forehead: Patient was seen by me on 12/22/2021 with a mass on her forehead.  Ultrasound obtained and referral made to dermatology.  Patient has appointment in March of next year.  Mass has decreased in size since August     Review of Systems  Constitutional:  Negative for chills and fever.  Respiratory:  Negative for shortness of breath.   Cardiovascular:  Negative for chest pain.  Gastrointestinal:  Negative for abdominal pain, constipation, diarrhea, nausea and vomiting.       BM daily   Genitourinary:  Negative for dysuria and hematuria.  Neurological:  Positive for tingling (in feet). Negative for headaches.      Objective:     BP 118/76   Pulse 76   Temp (!) 96.3 F (35.7 C)   Resp 14   Ht '5\' 2"'$  (1.575 m)   Wt 184 lb (83.5 kg)   LMP  11/26/2017   SpO2 100%   BMI 33.65 kg/m    Physical Exam Vitals and nursing note reviewed.  Constitutional:      Appearance: Normal appearance.  Cardiovascular:     Rate and Rhythm: Normal rate and regular rhythm.     Heart sounds: Normal heart sounds.  Pulmonary:     Breath sounds: Normal breath sounds.  Musculoskeletal:     Right lower leg: No edema.     Left lower leg: No edema.  Lymphadenopathy:     Cervical: No cervical adenopathy.  Neurological:     Mental Status: She is alert.     Diabetic Foot Form - Detailed   Diabetic Foot Exam - detailed Diabetic Foot exam was performed with the following findings: Yes   Is there swelling or and abnormal foot shape?: No Is there a claw toe deformity?: No Is there elevated skin temparature?: No Pulse Foot Exam completed.: Yes   Right posterior Tibialias: Present Left posterior Tibialias: Present   Right Dorsalis Pedis: Present Left Dorsalis Pedis: Present  Semmes-Weinstein Monofilament Test   Comments: Sites 1,2,3 are absent with sensation on bilateral feet  Dry skin  Callous between 4th and 5th toes laterally on the right foot     Results for orders placed or  performed in visit on 03/23/22  Comprehensive metabolic panel  Result Value Ref Range   Sodium 140 135 - 145 mEq/L   Potassium 4.5 3.5 - 5.1 mEq/L   Chloride 105 96 - 112 mEq/L   CO2 30 19 - 32 mEq/L   Glucose, Bld 105 (H) 70 - 99 mg/dL   BUN 15 6 - 23 mg/dL   Creatinine, Ser 1.18 0.40 - 1.20 mg/dL   Total Bilirubin 0.6 0.2 - 1.2 mg/dL   Alkaline Phosphatase 84 39 - 117 U/L   AST 20 0 - 37 U/L   ALT 25 0 - 35 U/L   Total Protein 7.0 6.0 - 8.3 g/dL   Albumin 4.2 3.5 - 5.2 g/dL   GFR 54.46 (L) >60.00 mL/min   Calcium 9.7 8.4 - 10.5 mg/dL  POCT glycosylated hemoglobin (Hb A1C)  Result Value Ref Range   Hemoglobin A1C 6.5 (A) 4.0 - 5.6 %   HbA1c POC (<> result, manual entry)     HbA1c, POC (prediabetic range)     HbA1c, POC (controlled diabetic range)         The ASCVD Risk score (Arnett DK, et al., 2019) failed to calculate for the following reasons:   The valid total cholesterol range is 130 to 320 mg/dL    Assessment & Plan:   Problem List Items Addressed This Visit       Cardiovascular and Mediastinum   HTN (hypertension)    Patient currently maintained on lisinopril 10 mg.  Taking medication as prescribed.  Tolerating medication well.  Blood pressure within normal limits.  Continue taking medication as prescribed        Digestive   GERD (gastroesophageal reflux disease)    Completed a 42-monthtrial of a PPI.  Patient is tolerating reflux and Tums without intervention.  She has had made some dietary modifications.  Did tell patient to get over-the-counter famotidine to use as needed.        Endocrine   DM [place]diabetes mellitus) [place]    Patient's A1c is 6.5% today.  Patient currently maintained on Farxiga, metformin, glipizide.  Continue taking medication as prescribed continue working on lifestyle modifications.  No medication changes today      Relevant Orders   POCT glycosylated hemoglobin (Hb A1C) (Completed)   Comprehensive metabolic panel (Completed)     Nervous and Auditory   Neuropathy    Affect sensation to bilateral lower toes on the plantar surface.  Patient currently maintained on Lyrica        Other   Decreased GFR    Pending CMP.      Relevant Orders   Comprehensive metabolic panel (Completed)   Elevated LFTs    Pending CMP.      Relevant Orders   Comprehensive metabolic panel (Completed)   Other Visit Diagnoses     Need for immunization against influenza    -  Primary   Relevant Orders   Flu Vaccine QUAD 6+ mos PF IM (Fluarix Quad PF) (Completed)       Return in about 3 months (around 06/23/2022) for DM recheck .    MRomilda Garret NP

## 2022-03-23 NOTE — Assessment & Plan Note (Signed)
Patient's A1c is 6.5% today.  Patient currently maintained on Farxiga, metformin, glipizide.  Continue taking medication as prescribed continue working on lifestyle modifications.  No medication changes today

## 2022-03-23 NOTE — Assessment & Plan Note (Signed)
Affect sensation to bilateral lower toes on the plantar surface.  Patient currently maintained on Lyrica

## 2022-04-04 ENCOUNTER — Ambulatory Visit: Payer: Managed Care, Other (non HMO) | Admitting: Internal Medicine

## 2022-05-14 ENCOUNTER — Other Ambulatory Visit: Payer: Self-pay | Admitting: Nurse Practitioner

## 2022-05-16 ENCOUNTER — Encounter: Payer: Self-pay | Admitting: Nurse Practitioner

## 2022-05-16 MED ORDER — ROSUVASTATIN CALCIUM 10 MG PO TABS: 10.0000 mg | ORAL_TABLET | Freq: Every day | ORAL | 3 refills | Status: DC

## 2022-05-22 ENCOUNTER — Encounter: Payer: Self-pay | Admitting: Family

## 2022-05-22 ENCOUNTER — Ambulatory Visit (INDEPENDENT_AMBULATORY_CARE_PROVIDER_SITE_OTHER): Payer: Managed Care, Other (non HMO) | Admitting: Family

## 2022-05-22 VITALS — BP 134/82 | HR 98 | Temp 98.3°F | Ht 62.0 in | Wt 184.0 lb

## 2022-05-22 DIAGNOSIS — J020 Streptococcal pharyngitis: Secondary | ICD-10-CM | POA: Insufficient documentation

## 2022-05-22 DIAGNOSIS — Z20822 Contact with and (suspected) exposure to covid-19: Secondary | ICD-10-CM | POA: Diagnosis not present

## 2022-05-22 DIAGNOSIS — J029 Acute pharyngitis, unspecified: Secondary | ICD-10-CM | POA: Diagnosis not present

## 2022-05-22 LAB — POC COVID19 BINAXNOW: SARS Coronavirus 2 Ag: NEGATIVE

## 2022-05-22 LAB — POCT RAPID STREP A (OFFICE): Rapid Strep A Screen: POSITIVE — AB

## 2022-05-22 MED ORDER — AMOXICILLIN-POT CLAVULANATE 875-125 MG PO TABS: 1.0000 | ORAL_TABLET | Freq: Two times a day (BID) | ORAL | 0 refills | Status: DC

## 2022-05-22 NOTE — Patient Instructions (Signed)
  You were found to be strep positive,  Take antibiotics that have been sent to the pharmacy.  Change your toothbrush after 24 hours on the antibiotics.  Gargle with warm salt water as needed for sore throat.    Regards,   Eugenia Pancoast FNP-C

## 2022-05-22 NOTE — Progress Notes (Signed)
Established Patient Office Visit  Subjective:  Patient ID: Sherry Hobbs, female    DOB: [birth year]  Age: 52 y.o. MRN: [id number]  CC:  Chief Complaint  Patient presents with   Sore Throat    Onset of symptoms 05/19/22 Sore throat, cough, right ear pressure, chest congestion, chills  Declines fever, has not tested for covid    HPI Sherry Hobbs is here today with concerns.   Four days ago started with hoarseness and losing her voice and progressively got worse over the weekend. Right ear with pain and pressure and right side of throat with soreness. She states she can has some chest congestion, productive cough with greenish sputum. She denies wheezing or sob. Slight sore throat.   No fever but chills.  Took some sudafed with mild improvement but not much relief.   Past Medical History:  Diagnosis Date   Anemia    problems when having abnmornal uterine bleeding.   Arthritis    lower back   Depression    Diabetes mellitus without complication ([place])    DM (diabetes mellitus) ([place]) 08/07/2019   Dysuria 12/01/2019   Elevated serum creatinine    GERD (gastroesophageal reflux disease)    OCC  NO MEDS   Headache    occasional migraines   History of blood transfusion 05/02/2016   due to abnormal uterine bleeding   HTN (hypertension) 08/07/2019   Hypertension    Neuropathy    feet    Past Surgical History:  Procedure Laterality Date   ABDOMINAL HYSTERECTOMY     partial   BACK SURGERY     COLONOSCOPY WITH PROPOFOL N/A 09/27/2020   Procedure: COLONOSCOPY WITH PROPOFOL;  Surgeon: Jonathon Bellows, MD;  Location[facility] ENDOSCOPY;  Service: Gastroenterology;  Laterality: N/A;   DILATION AND CURETTAGE OF UTERUS  05/03/2016   Procedure: DILATATION AND CURETTAGE;  Surgeon: [name], MD;  Location: [facility] ORS;  Service: Gynecology;;   DILITATION & CURRETTAGE/HYSTROSCOPY WITH NOVASURE ABLATION N/A 12/03/2017   Procedure: DILATATION & CURETTAGE/HYSTEROSCOPY WITH MINERVA ABLATION;   Surgeon: [name], MD;  Location: [facility] ORS;  Service: Gynecology;  Laterality: N/A;  MINERVA REP CONTACTED [facility] [place] [phone])   LAPAROSCOPIC ASSISTED VAGINAL HYSTERECTOMY N/A 07/07/2019   Procedure: LAPAROSCOPIC ASSISTED VAGINAL HYSTERECTOMY;  Surgeon: [name], MD;  Location: [facility] ORS;  Service: Gynecology;  Laterality: N/A;  possible TVH   LAPAROSCOPIC BILATERAL SALPINGECTOMY Bilateral 07/07/2019   Procedure: LAPAROSCOPIC BILATERAL SALPINGECTOMY;  Surgeon: [name], MD;  Location: [facility] ORS;  Service: Gynecology;  Laterality: Bilateral;   LUMBAR LAMINECTOMY/DECOMPRESSION MICRODISCECTOMY Left 06/24/2018   Procedure: LUMBAR HEMILAMINECTOMY, FORAMINOTOMY, CYST REMOVAL - 1 LEVEL LEFT L4-5;  Surgeon: [name], MD;  Location: [facility] ORS;  Service: Neurosurgery;  Laterality: Left;    Family History  Problem Relation Age of Onset   Diabetes Mother    COPD Mother    Emphysema Mother    Osteoporosis Mother    Lung cancer Mother 24   Breast cancer Mother 30   CAD Father    Early death Father 74   Diabetes Sister    Diabetes Sister    [place] Sister    Cervical cancer Sister    Diabetes Brother    CAD Brother    Celiac disease Brother    Diabetes Brother    Heart disease Brother    Renal cancer Maternal Uncle        1 uncle   Brain cancer Maternal Uncle  different uncle   Diabetes Maternal Grandmother    Stroke Maternal Grandmother    Bladder Cancer Maternal Grandfather     Social History   Socioeconomic History   Marital status: Married    Spouse name: william   Number of children: 1   Years of education: Not on file   Highest education level: Associate degree: academic program  Occupational History   Occupation: Solicitor  Tobacco Use   Smoking status: Never   Smokeless tobacco: Never  Vaping Use   Vaping Use: Never used  Substance and Sexual Activity   Alcohol use: Yes    Comment: occassional once a month wine/liquor 2 drinks    Drug use: No   Sexual activity: Not Currently    Birth control/protection: None  Other Topics Concern   Not on file  Social History Narrative   Fulltime: works [employer]. Transportation assistance       Social Determinants of Health   Financial Resource Strain: Not on file  Food Insecurity: Not on file  Transportation Needs: Not on file  Physical Activity: Not on file  Stress: Not on file  Social Connections: Not on file  Intimate Partner Violence: Not on file    Outpatient Medications Prior to Visit  Medication Sig Dispense Refill   ACCU-CHEK GUIDE test strip CHECK TWICE A DAY 100 strip 3   blood glucose meter kit and supplies Dispense based on patient and insurance preference. Check sugar twice daily E11.9 1 each 12   cholecalciferol (VITAMIN D3) 25 MCG (1000 UNIT) tablet Take 1,000 Units by mouth daily.     docusate sodium (COLACE) 100 MG capsule Take 100 mg by mouth 2 (two) times daily.     FARXIGA 10 MG TABS tablet TAKE 1 TABLET BY MOUTH DAILY BEFORE BREAKFAST. 30 tablet 5   glipiZIDE (GLIPIZIDE XL) 5 MG 24 hr tablet Take 1 tablet (5 mg total) by mouth daily with breakfast. 90 tablet 1   lisinopril (ZESTRIL) 10 MG tablet Take 1 tablet (10 mg total) by mouth every morning. 90 tablet 1   metFORMIN (GLUCOPHAGE) 500 MG tablet TAKE 2 TABLETS (1,000 MG TOTAL) BY MOUTH 2 (TWO) TIMES DAILY WITH A MEAL. 360 tablet 1   methocarbamol (ROBAXIN) 750 MG tablet Take 1 tablet (750 mg total) by mouth every 8 (eight) hours as needed for muscle spasms. 90 tablet 1   nortriptyline (PAMELOR) 10 MG capsule Take 10 mg by mouth at bedtime. Increase to 20 mg after 1 week.     omeprazole (PRILOSEC) 20 MG capsule Take 1 capsule (20 mg total) by mouth daily. 90 capsule 0   pregabalin (LYRICA) 100 MG capsule Take 100 mg by mouth See admin instructions. 100 mg QAM, 100 mg mid day 200 mg QHS     rosuvastatin (CRESTOR) 10 MG tablet Take 1 tablet (10 mg total) by mouth daily. 90 tablet 3   vitamin B-12  (CYANOCOBALAMIN) 1000 MCG tablet Take 1,000 mcg by mouth daily.     No facility-administered medications prior to visit.    Allergies  Allergen Reactions   Codeine Nausea And Vomiting        Objective:    Physical Exam Constitutional:      General: She is not in acute distress.    Appearance: Normal appearance. She is not ill-appearing.  HENT:     Right Ear: Tympanic membrane normal.     Left Ear: A middle ear effusion (clear) is present. Tympanic membrane is retracted.  Ears:     Comments: Ear wax occluding canal to visualize TM Ear canal without erythema     Nose: Nose normal. No congestion or rhinorrhea.     Right Turbinates: Not enlarged or swollen.     Left Turbinates: Not enlarged or swollen.     Right Sinus: No maxillary sinus tenderness or frontal sinus tenderness.     Left Sinus: No maxillary sinus tenderness or frontal sinus tenderness.     Mouth/Throat:     Mouth: Mucous membranes are moist.     Pharynx: Posterior oropharyngeal erythema present. No pharyngeal swelling or oropharyngeal exudate.     Tonsils: No tonsillar exudate or tonsillar abscesses. 2+ on the right. 0 on the left.  Eyes:     Extraocular Movements: Extraocular movements intact.     Conjunctiva/sclera: Conjunctivae normal.     Pupils: Pupils are equal, round, and reactive to light.  Neck:     Thyroid: No thyroid mass.  Cardiovascular:     Rate and Rhythm: Normal rate and regular rhythm.  Pulmonary:     Effort: Pulmonary effort is normal.     Breath sounds: Normal breath sounds.  Lymphadenopathy:     Cervical: Cervical adenopathy present.     Right cervical: Superficial cervical adenopathy present.     Left cervical: No superficial cervical adenopathy.  Neurological:     Mental Status: She is alert.     BP 134/82   Pulse 98   Temp 98.3 F (36.8 C) (Temporal)   Ht '5\' 2"'$  (1.575 m)   Wt 184 lb (83.5 kg)   LMP 11/26/2017   SpO2 91%   BMI 33.65 kg/m  Wt Readings from Last 3  Encounters:  05/22/22 184 lb (83.5 kg)  03/23/22 184 lb (83.5 kg)  12/22/21 181 lb (82.1 kg)     Health Maintenance Due  Topic Date Due   COVID-19 Vaccine (3 - Pfizer risk series) 07/08/2019    There are no preventive care reminders to display for this patient.  Lab Results  Component Value Date   TSH 3.19 07/12/2021   Lab Results  Component Value Date   WBC 7.6 11/30/2021   HGB 13.1 11/30/2021   HCT 39.0 11/30/2021   MCV 83.7 11/30/2021   PLT 206.0 11/30/2021   Lab Results  Component Value Date   NA 140 03/23/2022   K 4.5 03/23/2022   CO2 30 03/23/2022   GLUCOSE 105 (H) 03/23/2022   BUN 15 03/23/2022   CREATININE 1.18 03/23/2022   BILITOT 0.6 03/23/2022   ALKPHOS 84 03/23/2022   AST 20 03/23/2022   ALT 25 03/23/2022   PROT 7.0 03/23/2022   ALBUMIN 4.2 03/23/2022   CALCIUM 9.7 03/23/2022   ANIONGAP 9 11/21/2019   EGFR 65 08/12/2020   GFR 54.46 (L) 03/23/2022   Lab Results  Component Value Date   HGBA1C 6.5 (A) 03/23/2022      Assessment & Plan:   Problem List Items Addressed This Visit       Respiratory   Strep pharyngitis     Strep tested positive in office.  rx augmentin 875/125 mg po bid x 10 days Ibuprofen/tyelnol prn sore throat/fever Pt told to F/u if no improvement in the next 2-3 days.       Other Visit Diagnoses     Sore throat    -  Primary   Relevant Medications   amoxicillin-clavulanate (AUGMENTIN) 875-125 MG tablet   Other Relevant Orders   POC COVID-19 (Completed)  POCT rapid strep A (Completed)   Contact with and (suspected) exposure to covid-19       Relevant Orders   POC COVID-19 (Completed)       Meds ordered this encounter  Medications   amoxicillin-clavulanate (AUGMENTIN) 875-125 MG tablet    Sig: Take 1 tablet by mouth 2 (two) times daily.    Dispense:  20 tablet    Refill:  0    Order Specific Question:   Supervising Provider    Answer:   Diona Browner, AMY E [[id number]]    Follow-up: Return for f/u with primary  care provider if no improvement.    Eugenia Pancoast, FNP

## 2022-05-22 NOTE — Assessment & Plan Note (Signed)
Strep tested positive in office.  rx augmentin 875/125 mg po bid x 10 days Ibuprofen/tyelnol prn sore throat/fever Pt told to F/u if no improvement in the next 2-3 days.  

## 2022-06-04 ENCOUNTER — Other Ambulatory Visit: Payer: Self-pay | Admitting: Nurse Practitioner

## 2022-06-04 DIAGNOSIS — E1165 Type 2 diabetes mellitus with hyperglycemia: Secondary | ICD-10-CM

## 2022-06-04 DIAGNOSIS — E785 Hyperlipidemia, unspecified: Secondary | ICD-10-CM

## 2022-06-04 DIAGNOSIS — I152 Hypertension secondary to endocrine disorders: Secondary | ICD-10-CM

## 2022-06-12 ENCOUNTER — Other Ambulatory Visit: Payer: Self-pay | Admitting: Nurse Practitioner

## 2022-06-12 DIAGNOSIS — K219 Gastro-esophageal reflux disease without esophagitis: Secondary | ICD-10-CM

## 2022-06-23 ENCOUNTER — Encounter: Payer: Self-pay | Admitting: Nurse Practitioner

## 2022-06-23 ENCOUNTER — Ambulatory Visit: Payer: Managed Care, Other (non HMO) | Admitting: Nurse Practitioner

## 2022-07-07 ENCOUNTER — Other Ambulatory Visit: Payer: Self-pay | Admitting: Nurse Practitioner

## 2022-07-07 NOTE — Telephone Encounter (Signed)
Lvm for patient to call and schedule

## 2022-07-10 NOTE — Telephone Encounter (Signed)
LVM for patient to call back and scheduled and sent MyChart message.

## 2022-07-12 NOTE — Telephone Encounter (Signed)
Left message to return call to our office.  

## 2022-07-13 NOTE — Telephone Encounter (Signed)
LVM for patient to call and schedule

## 2022-07-21 NOTE — Telephone Encounter (Signed)
Called and scheduled patient appt for 07/31/22.

## 2022-07-26 ENCOUNTER — Other Ambulatory Visit: Payer: Self-pay | Admitting: Nurse Practitioner

## 2022-07-31 ENCOUNTER — Telehealth: Payer: Self-pay | Admitting: Nurse Practitioner

## 2022-07-31 ENCOUNTER — Encounter: Payer: Self-pay | Admitting: Nurse Practitioner

## 2022-07-31 ENCOUNTER — Ambulatory Visit (INDEPENDENT_AMBULATORY_CARE_PROVIDER_SITE_OTHER): Payer: Managed Care, Other (non HMO) | Admitting: Nurse Practitioner

## 2022-07-31 ENCOUNTER — Ambulatory Visit: Payer: Managed Care, Other (non HMO) | Admitting: Dermatology

## 2022-07-31 VITALS — BP 136/74 | HR 78 | Temp 98.6°F | Resp 16 | Ht 62.0 in | Wt 185.4 lb

## 2022-07-31 DIAGNOSIS — U071 COVID-19: Secondary | ICD-10-CM | POA: Diagnosis not present

## 2022-07-31 DIAGNOSIS — R051 Acute cough: Secondary | ICD-10-CM | POA: Diagnosis not present

## 2022-07-31 DIAGNOSIS — E1165 Type 2 diabetes mellitus with hyperglycemia: Secondary | ICD-10-CM | POA: Diagnosis not present

## 2022-07-31 DIAGNOSIS — I10 Essential (primary) hypertension: Secondary | ICD-10-CM

## 2022-07-31 LAB — BASIC METABOLIC PANEL
BUN: 15 mg/dL (ref 6–23)
CO2: 25 mEq/L (ref 19–32)
Calcium: 9.3 mg/dL (ref 8.4–10.5)
Chloride: 105 mEq/L (ref 96–112)
Creatinine, Ser: 1.19 mg/dL (ref 0.40–1.20)
GFR: 53.78 mL/min — ABNORMAL LOW (ref >60.00)
Glucose, Bld: 164 mg/dL — ABNORMAL HIGH (ref 70–99)
Potassium: 4.2 mEq/L (ref 3.5–5.1)
Sodium: 140 mEq/L (ref 135–145)

## 2022-07-31 LAB — POCT GLYCOSYLATED HEMOGLOBIN (HGB A1C): Hemoglobin A1C: 6.6 % — AB (ref 4.0–5.6)

## 2022-07-31 LAB — POC COVID19 BINAXNOW: SARS Coronavirus 2 Ag: POSITIVE — AB

## 2022-07-31 MED ORDER — BENZONATATE 100 MG PO CAPS: 100.0000 mg | ORAL_CAPSULE | Freq: Three times a day (TID) | ORAL | 0 refills | Status: DC | PRN

## 2022-07-31 MED ORDER — MOLNUPIRAVIR EUA 200MG CAPSULE: 4.0000 | ORAL_CAPSULE | Freq: Two times a day (BID) | ORAL | 0 refills | Status: AC

## 2022-07-31 NOTE — Telephone Encounter (Signed)
Patient is stating that the fargixa is expensive do they offer PAP?

## 2022-07-31 NOTE — Assessment & Plan Note (Signed)
Patient currently maintained on lisinopril 10 mg daily.  Blood pressure within normal limits.  Continue medication as prescribed

## 2022-07-31 NOTE — Assessment & Plan Note (Signed)
Patient currently maintained on metformin 1000 mg twice daily glipizide 5 mg XL Farxiga 10 mg daily.  A1c is 6.6 today.  Patient states Wilder Glade is expensive and she cannot afford it.  Will reach out to pharmacist see if there are CPAP available if not we can consider discontinuing Farxiga and adding on an additional glipizide XL if needed

## 2022-07-31 NOTE — Patient Instructions (Addendum)
Nice to see you today I will be in touch with the labs You need to quarantine through 08/02/2022. On 08/03/2022 if you have been fever free for 24 hours without the use of fever reducing medications and your symptoms are improving then you can go back to work but need to wear a mask through 08/07/2022

## 2022-07-31 NOTE — Telephone Encounter (Signed)
Patient called back and relayed message below.

## 2022-07-31 NOTE — Telephone Encounter (Signed)
Left message to return call to our office.  Called the pharmacy and gave them the information and stated it was saved to her profile.

## 2022-07-31 NOTE — Progress Notes (Signed)
Established Patient Office Visit  Subjective   Patient ID: Sherry Hobbs, female    DOB: 09/06/1970  Age: 52 y.o. MRN: [id number]  Chief Complaint  Patient presents with   Diabetes   Cough    Started Friday      HTN: patient is on lisinopril 10 mg. Once a week she will check it and doing well.   DM2: currenlty on glipizide, metformin, and farxiga. 6.6 States that she is checking her levels once a day 150s-160s. States that is around 9am. States two low readings of 89 and felt weird. States that she has had some soda as of late States that she has been taking the [place] but is expensive   Sick symptoms: symptoms started on Friday.  States 5 coworkers that have been positive for covid States she took a coivd test on Friday and it was negative  Covid vaccine utd Flu vaccine: utd Mucinex over the counter that has helped     Review of Systems  Constitutional:  Negative for chills, fever and malaise/fatigue.       Appetite decreased because her taste is diminished  Fluid intake is good   HENT:  Positive for ear pain and sinus pain. Negative for ear discharge and sore throat.   Respiratory:  Positive for cough and sputum production. Negative for shortness of breath.   Musculoskeletal:  Negative for joint pain and myalgias.  Neurological:  Negative for headaches.      Objective:     BP 136/74   Pulse 78   Temp 98.6 F (37 C)   Resp 16   Ht 5\' 2"  (1.575 m)   Wt 185 lb 6 oz (84.1 kg)   LMP 11/26/2017   SpO2 98%   BMI 33.91 kg/m    Physical Exam Vitals and nursing note reviewed.  Constitutional:      Appearance: Normal appearance.  HENT:     Right Ear: Ear canal and external ear normal. There is impacted cerumen.     Left Ear: Ear canal and external ear normal.     Mouth/Throat:     Mouth: Mucous membranes are moist.     Pharynx: Oropharynx is clear.  Cardiovascular:     Rate and Rhythm: Normal rate and regular rhythm.     Pulses:          Dorsalis  pedis pulses are 2+ on the right side and 2+ on the left side.       Posterior tibial pulses are 2+ on the right side and 2+ on the left side.     Heart sounds: Normal heart sounds.  Pulmonary:     Effort: Pulmonary effort is normal.     Breath sounds: Normal breath sounds.  Musculoskeletal:     Right lower leg: No edema.     Left lower leg: No edema.  Feet:     Right foot:     Skin integrity: Dry skin present.     Left foot:     Skin integrity: Dry skin present.  Lymphadenopathy:     Cervical: No cervical adenopathy.  Neurological:     Mental Status: She is alert.      Results for orders placed or performed in visit on 07/31/22  POCT glycosylated hemoglobin (Hb A1C)  Result Value Ref Range   Hemoglobin A1C 6.6 (A) 4.0 - 5.6 %   HbA1c POC (<> result, manual entry)     HbA1c, POC (prediabetic range)  HbA1c, POC (controlled diabetic range)    POC COVID-19 BinaxNow  Result Value Ref Range   SARS Coronavirus 2 Ag Positive (A) Negative      The ASCVD Risk score (Arnett DK, et al., 2019) failed to calculate for the following reasons:   The valid total cholesterol range is 130 to 320 mg/dL    Assessment & Plan:   Problem List Items Addressed This Visit       Cardiovascular and Mediastinum   HTN (hypertension)    Patient currently maintained on lisinopril 10 mg daily.  Blood pressure within normal limits.  Continue medication as prescribed      Relevant Orders   Basic metabolic panel     Endocrine   DM (diabetes mellitus) ([place]) - Primary    Patient currently maintained on metformin 1000 mg twice daily glipizide 5 mg XL Farxiga 10 mg daily.  A1c is 6.6 today.  Patient states Wilder Glade is expensive and she cannot afford it.  Will reach out to pharmacist see if there are CPAP available if not we can consider discontinuing Farxiga and adding on an additional glipizide XL if needed      Relevant Orders   POCT glycosylated hemoglobin (Hb A1C) (Completed)   Basic  metabolic panel     Other   Acute cough    COVID test in office.  Tessalon Perles 100 mg 3 times daily as needed      Relevant Medications   benzonatate (TESSALON) 100 MG capsule   Other Relevant Orders   POC COVID-19 BinaxNow (Completed)   COVID-19    Positive COVID test in office.  Did discuss antivirals and that they are EUA only.  After joint discussion we will like to use molnupiravir.  Did review CDC guidelines/recommendations in regards to self-isolation/quarantine.  Did review signs and symptoms when to seek emergent healthcare.  Patient was given a note with quarantine guidelines and restrictions to give to her employer.      Relevant Medications   molnupiravir EUA (LAGEVRIO) 200 mg CAPS capsule    Return in about 3 months (around 10/31/2022) for DM recheck.    Romilda Garret, NP

## 2022-07-31 NOTE — Assessment & Plan Note (Signed)
Positive COVID test in office.  Did discuss antivirals and that they are EUA only.  After joint discussion we will like to use molnupiravir.  Did review CDC guidelines/recommendations in regards to self-isolation/quarantine.  Did review signs and symptoms when to seek emergent healthcare.  Patient was given a note with quarantine guidelines and restrictions to give to her employer.

## 2022-07-31 NOTE — Assessment & Plan Note (Signed)
COVID test in office.  Tessalon Perles 100 mg 3 times daily as needed

## 2022-07-31 NOTE — Telephone Encounter (Signed)
PAP is only for patients with Medicare or no insurance. For commercial coverage, there are copay cards available that can significantly reduce the patient's out-of-pocket cost.  Obtained copay card for patient:  Wilder Glade copay card: BIN: [id number] Grp: [id number] PCN: [id number] [id number]  Can your assistant call the above info into the pharmacy for the patient?

## 2022-08-17 ENCOUNTER — Encounter: Payer: Self-pay | Admitting: Nurse Practitioner

## 2022-08-17 NOTE — Telephone Encounter (Signed)
I spoke with pt; 08/14/22 started with urgency and frequency of urine. Pt has burning sensation at end of urination; no abd pain or back pain (pt presently having sciatic pain). Pt feels full in abd. No fever and no blood seen in urine. Offered pt appt 08/17/22 but pt declined due to not being able to leave work. Pt scheduled appt with Romilda Garret NP on 08/18/22 at 3pm with UC & ED precautions. Pt voiced understanding and appreciative of appt. Sending note to Romilda Garret NP.

## 2022-08-17 NOTE — Telephone Encounter (Signed)
Noted. I will evaluate in office  

## 2022-08-18 ENCOUNTER — Ambulatory Visit: Payer: Managed Care, Other (non HMO) | Admitting: Nurse Practitioner

## 2022-08-21 ENCOUNTER — Ambulatory Visit: Payer: Managed Care, Other (non HMO) | Admitting: Nurse Practitioner

## 2022-08-21 ENCOUNTER — Encounter: Payer: Self-pay | Admitting: Nurse Practitioner

## 2022-08-29 ENCOUNTER — Encounter: Payer: Self-pay | Admitting: Nurse Practitioner

## 2022-09-18 ENCOUNTER — Other Ambulatory Visit: Payer: Self-pay | Admitting: Nurse Practitioner

## 2022-10-04 ENCOUNTER — Other Ambulatory Visit: Payer: Self-pay | Admitting: Nurse Practitioner

## 2022-10-04 DIAGNOSIS — K219 Gastro-esophageal reflux disease without esophagitis: Secondary | ICD-10-CM

## 2022-10-16 ENCOUNTER — Telehealth: Payer: Self-pay | Admitting: Nurse Practitioner

## 2022-10-16 ENCOUNTER — Other Ambulatory Visit: Payer: Self-pay

## 2022-10-16 ENCOUNTER — Other Ambulatory Visit: Payer: Self-pay | Admitting: Nurse Practitioner

## 2022-10-16 NOTE — Telephone Encounter (Signed)
Patient contacted the office regarding test strips, stated she was given the freestyle test strips when she has the accu check glucose reader. Patient needs the accucheck test strips sent in to [facility], please advise.

## 2022-10-17 ENCOUNTER — Other Ambulatory Visit: Payer: Self-pay

## 2022-10-17 DIAGNOSIS — E1165 Type 2 diabetes mellitus with hyperglycemia: Secondary | ICD-10-CM

## 2022-10-17 MED ORDER — GLUCOSE BLOOD VI STRP: ORAL_STRIP | 12 refills | Status: AC

## 2022-10-17 NOTE — Telephone Encounter (Signed)
Accu strips sent in.

## 2022-10-17 NOTE — Telephone Encounter (Signed)
Can we send in the appropriate strips for the patient enough for twice daily use

## 2022-11-03 ENCOUNTER — Ambulatory Visit: Payer: Managed Care, Other (non HMO) | Admitting: Nurse Practitioner

## 2022-11-03 DIAGNOSIS — E1165 Type 2 diabetes mellitus with hyperglycemia: Secondary | ICD-10-CM

## 2022-11-06 ENCOUNTER — Encounter: Payer: Self-pay | Admitting: Nurse Practitioner

## 2022-12-04 ENCOUNTER — Other Ambulatory Visit: Payer: Self-pay | Admitting: Nurse Practitioner

## 2022-12-04 DIAGNOSIS — I152 Hypertension secondary to endocrine disorders: Secondary | ICD-10-CM

## 2022-12-04 DIAGNOSIS — E1165 Type 2 diabetes mellitus with hyperglycemia: Secondary | ICD-10-CM

## 2022-12-04 DIAGNOSIS — E785 Hyperlipidemia, unspecified: Secondary | ICD-10-CM

## 2022-12-07 NOTE — Telephone Encounter (Signed)
Can we get the patient scheduled per my last office note please

## 2022-12-08 NOTE — Telephone Encounter (Signed)
Have scheduled with patient for 8/1 at 8am

## 2022-12-15 ENCOUNTER — Ambulatory Visit (INDEPENDENT_AMBULATORY_CARE_PROVIDER_SITE_OTHER): Payer: Managed Care, Other (non HMO) | Admitting: Nurse Practitioner

## 2022-12-15 ENCOUNTER — Encounter: Payer: Self-pay | Admitting: Nurse Practitioner

## 2022-12-15 VITALS — BP 118/70 | HR 81 | Temp 97.8°F | Ht 62.0 in | Wt 186.0 lb

## 2022-12-15 DIAGNOSIS — Z7984 Long term (current) use of oral hypoglycemic drugs: Secondary | ICD-10-CM

## 2022-12-15 DIAGNOSIS — G629 Polyneuropathy, unspecified: Secondary | ICD-10-CM

## 2022-12-15 DIAGNOSIS — K219 Gastro-esophageal reflux disease without esophagitis: Secondary | ICD-10-CM

## 2022-12-15 DIAGNOSIS — J392 Other diseases of pharynx: Secondary | ICD-10-CM | POA: Insufficient documentation

## 2022-12-15 DIAGNOSIS — E1165 Type 2 diabetes mellitus with hyperglycemia: Secondary | ICD-10-CM | POA: Diagnosis not present

## 2022-12-15 DIAGNOSIS — I10 Essential (primary) hypertension: Secondary | ICD-10-CM

## 2022-12-15 LAB — POCT GLYCOSYLATED HEMOGLOBIN (HGB A1C): Hemoglobin A1C: 7 % — AB (ref 4.0–5.6)

## 2022-12-15 MED ORDER — LEVOCETIRIZINE DIHYDROCHLORIDE 5 MG PO TABS: 5.0000 mg | ORAL_TABLET | Freq: Every evening | ORAL | 0 refills | Status: DC

## 2022-12-15 MED ORDER — FLUTICASONE PROPIONATE 50 MCG/ACT NA SUSP: 2.0000 | Freq: Every day | NASAL | 0 refills | Status: DC

## 2022-12-15 NOTE — Patient Instructions (Signed)
Nice to see you today I want to see you in 4 months. We will do your physical and labs at that point. Let me know if the allergy pill and nasal spray doesn't help

## 2022-12-15 NOTE — Assessment & Plan Note (Signed)
Patient currently maintained on lisinopril 10 mg daily.  Tolerates medication well.  Blood pressure under great control today patient had any lightheadedness or dizziness continue medication as prescribed

## 2022-12-15 NOTE — Assessment & Plan Note (Signed)
Patient message me about this on MyChart.  I have recommended a second-generation antihistamine and topical steroid nasal spray.  Patient has not started either will send in levocetirizine 5 mg daily along with fluticasone nasal spray 50 mcg per actuation 2 sprays each nostril daily.  If this does not help we will consider putting her back on PPI therapy.

## 2022-12-15 NOTE — Progress Notes (Signed)
Established Patient Office Visit  Subjective   Patient ID: Sherry Hobbs, female    DOB: 1970-09-23  Age: 52 y.o. MRN: [id number]  Chief Complaint  Patient presents with   Diabetes    Pt states that her throat feels extremely dry.     DM2: Patient currently maintained on glipizide 5, metformin, Farxiga.  She does check her glucose approximately 3 times a week. States that she has not been doing good because she has been drinking more soda lately Sugars range from 95-221 States that she is still walking. Group at work    HTN: Patient currently on lisinopril 10 mg daily does check her blood pressure at home. Patient is doing well with the medication and denies any lightheadedness or dizziness   Throat dryness: states that she is not sure exactly how long it has been going on.  States when she messaged me via MyChart.  She was concerned as of 0 the thyroid as she had incidental finding of 1.1 cm nodule on the CT scan that required no further follow-up.  Patient recommended to second-generation histamine and topical nasal steroid which patient has not started.  Patient also has a history of GERD   Review of Systems  Constitutional:  Negative for chills and fever.  Respiratory:  Negative for shortness of breath.   Cardiovascular:  Negative for chest pain.  Gastrointestinal:  Negative for abdominal pain, nausea and vomiting.  Neurological:  Negative for dizziness and headaches.      Objective:     BP 118/70   Pulse 81   Temp 97.8 F (36.6 C) (Temporal)   Ht 5\' 2"  (1.575 m)   Wt 186 lb (84.4 kg)   LMP 11/26/2017   SpO2 95%   BMI 34.02 kg/m  BP Readings from Last 3 Encounters:  12/15/22 118/70  07/31/22 136/74  05/22/22 134/82   Wt Readings from Last 3 Encounters:  12/15/22 186 lb (84.4 kg)  07/31/22 185 lb 6 oz (84.1 kg)  05/22/22 184 lb (83.5 kg)      Physical Exam Vitals and nursing note reviewed.  Constitutional:      Appearance: Normal appearance.   Cardiovascular:     Rate and Rhythm: Normal rate and regular rhythm.     Pulses:          Dorsalis pedis pulses are 1+ on the right side and 1+ on the left side.       Posterior tibial pulses are 1+ on the right side and 1+ on the left side.     Heart sounds: Normal heart sounds.  Pulmonary:     Effort: Pulmonary effort is normal.     Breath sounds: Normal breath sounds.  Feet:     Right foot:     Skin integrity: Skin integrity normal.     Left foot:     Skin integrity: Skin integrity normal.  Neurological:     Mental Status: She is alert.      Results for orders placed or performed in visit on 12/15/22  POCT glycosylated hemoglobin (Hb A1C)  Result Value Ref Range   Hemoglobin A1C 7.0 (A) 4.0 - 5.6 %   HbA1c POC (<> result, manual entry)     HbA1c, POC (prediabetic range)     HbA1c, POC (controlled diabetic range)        The ASCVD Risk score (Arnett DK, et al., 2019) failed to calculate for the following reasons:   The valid total cholesterol range is  130 to 320 mg/dL    Assessment & Plan:   Problem List Items Addressed This Visit       Cardiovascular and Mediastinum   HTN (hypertension)    Patient currently maintained on lisinopril 10 mg daily.  Tolerates medication well.  Blood pressure under great control today patient had any lightheadedness or dizziness continue medication as prescribed        Digestive   GERD (gastroesophageal reflux disease)    Patient does have a history of GERD patient is having "throat problems".  If we do not get this resolved with second-generation antihistamine and topical steroid consider putting patient back on PPI.        Endocrine   DM (diabetes mellitus) (HCC) - Primary    Patient currently maintained on glipizide 5 mg XL, metformin 1000 mg twice daily, Farxiga 10 mg daily.  Patient's A1c did have a increase from 6.6-7.0.  Patient to continue work on healthy lifestyle modifications we will keep the antidiabetic regimen the  same.      Relevant Orders   POCT glycosylated hemoglobin (Hb A1C) (Completed)     Nervous and Auditory   Neuropathy    Patient was recently seen by neurology currently on amitriptyline 10 Lyrica 100 mg daily did review labs from neurology appointment.  Continue following with neurology recommended taking medication as prescribed.        Other   Throat dryness    Patient message me about this on MyChart.  I have recommended a second-generation antihistamine and topical steroid nasal spray.  Patient has not started either will send in levocetirizine 5 mg daily along with fluticasone nasal spray 50 mcg per actuation 2 sprays each nostril daily.  If this does not help we will consider putting her back on PPI therapy.      Relevant Medications   levocetirizine (XYZAL) 5 MG tablet   fluticasone (FLONASE) 50 MCG/ACT nasal spray    Return in about 4 months (around 04/16/2023) for CPE and Labs.    Audria Nine, NP

## 2022-12-15 NOTE — Assessment & Plan Note (Signed)
Patient does have a history of GERD patient is having "throat problems".  If we do not get this resolved with second-generation antihistamine and topical steroid consider putting patient back on PPI.

## 2022-12-15 NOTE — Assessment & Plan Note (Signed)
Patient currently maintained on glipizide 5 mg XL, metformin 1000 mg twice daily, Farxiga 10 mg daily.  Patient's A1c did have a increase from 6.6-7.0.  Patient to continue work on healthy lifestyle modifications we will keep the antidiabetic regimen the same.

## 2022-12-15 NOTE — Assessment & Plan Note (Signed)
Patient was recently seen by neurology currently on amitriptyline 10 Lyrica 100 mg daily did review labs from neurology appointment.  Continue following with neurology recommended taking medication as prescribed.

## 2023-01-03 ENCOUNTER — Other Ambulatory Visit: Payer: Self-pay | Admitting: Nurse Practitioner

## 2023-01-03 DIAGNOSIS — E1165 Type 2 diabetes mellitus with hyperglycemia: Secondary | ICD-10-CM

## 2023-01-09 ENCOUNTER — Other Ambulatory Visit: Payer: Self-pay | Admitting: Nurse Practitioner

## 2023-01-09 DIAGNOSIS — E1159 Type 2 diabetes mellitus with other circulatory complications: Secondary | ICD-10-CM

## 2023-01-09 DIAGNOSIS — K219 Gastro-esophageal reflux disease without esophagitis: Secondary | ICD-10-CM

## 2023-01-09 DIAGNOSIS — E785 Hyperlipidemia, unspecified: Secondary | ICD-10-CM

## 2023-01-12 ENCOUNTER — Other Ambulatory Visit: Payer: Self-pay | Admitting: Nurse Practitioner

## 2023-01-12 DIAGNOSIS — J392 Other diseases of pharynx: Secondary | ICD-10-CM

## 2023-01-17 ENCOUNTER — Other Ambulatory Visit: Payer: Self-pay | Admitting: Nurse Practitioner

## 2023-02-03 ENCOUNTER — Other Ambulatory Visit: Payer: Self-pay | Admitting: Nurse Practitioner

## 2023-02-13 LAB — HM DIABETES EYE EXAM

## 2023-02-23 ENCOUNTER — Other Ambulatory Visit: Payer: Self-pay | Admitting: Physician Assistant

## 2023-02-23 DIAGNOSIS — R202 Paresthesia of skin: Secondary | ICD-10-CM

## 2023-02-23 DIAGNOSIS — M5416 Radiculopathy, lumbar region: Secondary | ICD-10-CM

## 2023-02-23 DIAGNOSIS — G8929 Other chronic pain: Secondary | ICD-10-CM

## 2023-03-02 ENCOUNTER — Encounter: Payer: Self-pay | Admitting: Physician Assistant

## 2023-03-05 ENCOUNTER — Ambulatory Visit
Admission: RE | Admit: 2023-03-05 | Discharge: 2023-03-05 | Disposition: A | Discharge status: Home / Self Care | Payer: Managed Care, Other (non HMO) | Source: Ambulatory Visit | Attending: Physician Assistant | Admitting: Physician Assistant

## 2023-03-05 DIAGNOSIS — G8929 Other chronic pain: Secondary | ICD-10-CM

## 2023-03-05 DIAGNOSIS — R200 Anesthesia of skin: Secondary | ICD-10-CM

## 2023-03-05 DIAGNOSIS — M5416 Radiculopathy, lumbar region: Secondary | ICD-10-CM

## 2023-04-02 ENCOUNTER — Other Ambulatory Visit: Payer: Self-pay | Admitting: Nurse Practitioner

## 2023-04-02 DIAGNOSIS — E1165 Type 2 diabetes mellitus with hyperglycemia: Secondary | ICD-10-CM

## 2023-04-02 NOTE — Telephone Encounter (Signed)
Patient has cpe scheduled as requested in last visit.

## 2023-04-16 ENCOUNTER — Ambulatory Visit (INDEPENDENT_AMBULATORY_CARE_PROVIDER_SITE_OTHER): Payer: Managed Care, Other (non HMO) | Admitting: Nurse Practitioner

## 2023-04-16 ENCOUNTER — Encounter: Payer: Self-pay | Admitting: Nurse Practitioner

## 2023-04-16 VITALS — BP 122/64 | HR 86 | Temp 97.6°F | Ht 62.0 in | Wt 186.0 lb

## 2023-04-16 DIAGNOSIS — E669 Obesity, unspecified: Secondary | ICD-10-CM

## 2023-04-16 DIAGNOSIS — G629 Polyneuropathy, unspecified: Secondary | ICD-10-CM

## 2023-04-16 DIAGNOSIS — E1165 Type 2 diabetes mellitus with hyperglycemia: Secondary | ICD-10-CM

## 2023-04-16 DIAGNOSIS — Z1231 Encounter for screening mammogram for malignant neoplasm of breast: Secondary | ICD-10-CM

## 2023-04-16 DIAGNOSIS — Z0000 Encounter for general adult medical examination without abnormal findings: Secondary | ICD-10-CM | POA: Diagnosis not present

## 2023-04-16 DIAGNOSIS — Z7984 Long term (current) use of oral hypoglycemic drugs: Secondary | ICD-10-CM

## 2023-04-16 DIAGNOSIS — Z23 Encounter for immunization: Secondary | ICD-10-CM

## 2023-04-16 DIAGNOSIS — K219 Gastro-esophageal reflux disease without esophagitis: Secondary | ICD-10-CM | POA: Diagnosis not present

## 2023-04-16 DIAGNOSIS — I10 Essential (primary) hypertension: Secondary | ICD-10-CM

## 2023-04-16 LAB — LIPID PANEL
Cholesterol: 98 mg/dL (ref 0–200)
HDL: 25.3 mg/dL — ABNORMAL LOW (ref >39.00)
LDL Cholesterol: 27 mg/dL (ref 0–99)
NonHDL: 72.9
Total CHOL/HDL Ratio: 4
Triglycerides: 228 mg/dL — ABNORMAL HIGH (ref 0.0–149.0)
VLDL: 45.6 mg/dL — ABNORMAL HIGH (ref 0.0–40.0)

## 2023-04-16 LAB — COMPREHENSIVE METABOLIC PANEL
ALT: 30 U/L (ref 0–35)
AST: 15 U/L (ref 0–37)
Albumin: 4 g/dL (ref 3.5–5.2)
Alkaline Phosphatase: 78 U/L (ref 39–117)
BUN: 13 mg/dL (ref 6–23)
CO2: 24 meq/L (ref 19–32)
Calcium: 9.2 mg/dL (ref 8.4–10.5)
Chloride: 107 meq/L (ref 96–112)
Creatinine, Ser: 1.33 mg/dL — ABNORMAL HIGH (ref 0.40–1.20)
GFR: 46.82 mL/min — ABNORMAL LOW (ref >60.00)
Glucose, Bld: 199 mg/dL — ABNORMAL HIGH (ref 70–99)
Potassium: 4.3 meq/L (ref 3.5–5.1)
Sodium: 140 meq/L (ref 135–145)
Total Bilirubin: 0.8 mg/dL (ref 0.2–1.2)
Total Protein: 6.4 g/dL (ref 6.0–8.3)

## 2023-04-16 LAB — MICROALBUMIN / CREATININE URINE RATIO
Creatinine,U: 30.1 mg/dL
Microalb Creat Ratio: 2.3 mg/g (ref 0.0–30.0)
Microalb, Ur: <0.7 mg/dL (ref 0.0–1.9)

## 2023-04-16 LAB — CBC
HCT: 36.1 % (ref 36.0–46.0)
Hemoglobin: 12.2 g/dL (ref 12.0–15.0)
MCHC: 33.7 g/dL (ref 30.0–36.0)
MCV: 84.5 fL (ref 78.0–100.0)
Platelets: 219 10*3/uL (ref 150.0–400.0)
RBC: 4.28 Mil/uL (ref 3.87–5.11)
RDW: 13.9 % (ref 11.5–15.5)
WBC: 7.1 10*3/uL (ref 4.0–10.5)

## 2023-04-16 LAB — POCT GLYCOSYLATED HEMOGLOBIN (HGB A1C): Hemoglobin A1C: 6.4 % — AB (ref 4.0–5.6)

## 2023-04-16 LAB — TSH: TSH: 1.94 u[IU]/mL (ref 0.35–5.50)

## 2023-04-16 NOTE — Patient Instructions (Signed)
Nice to see you today Your A1C was 6.4% that is great I want to see you in 6 months, sooner if you need me  We did update your pneumonia vaccine today

## 2023-04-16 NOTE — Assessment & Plan Note (Signed)
History of the same.  Patient currently maintained on Farxiga 10 mg, glipizide 5 mg, metformin 1000 mg twice daily.  A1c of 6.4% today.  Continue medication as prescribed

## 2023-04-16 NOTE — Progress Notes (Signed)
Established Patient Office Visit  Subjective   Patient ID: Sherry Hobbs, female    DOB: 1970-10-14  Age: 52 y.o. MRN: [id number]  Chief Complaint  Patient presents with   Annual Exam    Not fasting ( had muffins for breakfast)    HPI  for complete physical and follow up of chronic conditions.  DM2: patietn currently on farxiga, gkipizide, metformin. States that sh eis checking her sugar at home approx once a week. States that she is getting 145s, 103, 169, 173. 94 is the lowest   HTN: on lisinopril 10mg .  Tolerating medication well.   Neuropathy: on nortriptyline and pregabalin through neruology . States that she is seen every 6 months. Nilda Calamity through [place] neurolog[facility]   Immunizations: -Tetanus: Completed in 2021 -Influenza: 04/10/2023 -Shingles: too young  -Pneumonia: Update Prevnar 20 today  Diet: Fair diet. 3 meals a day and some snacks. States that she does zero sodas and water  Exercise:  Walks 1 mile a day   Eye exam: Completes annually up to date Dental exam: Completes semi-annually    Colonoscopy: Completed in 09/27/2020, recall in 5 years.  Due 2027 Lung Cancer Screening: Completed in   Pap smear: hyrestectomy   Mammogram: 2022. Needs updating.  Order placed today for [facility]  Dexa: Too young  Sleep: states she goes to bed at 10 and up at 6. Feels rested most days. States that she does snore        Review of Systems  Constitutional:  Negative for chills and fever.  Respiratory:  Negative for shortness of breath.   Cardiovascular:  Negative for chest pain and leg swelling.  Gastrointestinal:  Negative for abdominal pain, blood in stool, constipation, diarrhea, nausea and vomiting.       Bm daily   Genitourinary:  Negative for dysuria and hematuria.  Neurological:  Positive for tingling. Negative for headaches.  Psychiatric/Behavioral:  Negative for hallucinations and suicidal ideas.    Title   Diabetic Foot Exam -  detailed Is there a history of foot ulcer?: No Is there a foot ulcer now?: No Is there swelling?: No Is there elevated skin temperature?: No Is there abnormal foot shape?: No Is there a claw toe deformity?: No Are the toenails long?: No Are the toenails thick?: No Are the toenails ingrown?: No Is the skin thin, fragile, shiny and hairless?": No Right Posterior Tibialis: Present Left posterior Tibialis: Present   Right Dorsalis Pedis: Present Left Dorsalis Pedis: Present     Semmes-Weinstein Monofilament Test "+" means "has sensation" and "-" means "no sensation"      Image components are not supported.   Image components are not supported. Image components are not supported.  [place] Comments right absent on 10, 1,4,6  Left absent on 4,5,3        Objective:     BP 122/64   Pulse 86   Temp 97.6 F (36.4 C) (Oral)   Ht 5\' 2"  (1.575 m)   Wt 186 lb (84.4 kg)   LMP 11/26/2017   SpO2 97%   BMI 34.02 kg/m  BP Readings from Last 3 Encounters:  04/16/23 122/64  12/15/22 118/70  07/31/22 136/74   Wt Readings from Last 3 Encounters:  04/16/23 186 lb (84.4 kg)  12/15/22 186 lb (84.4 kg)  07/31/22 185 lb 6 oz (84.1 kg)   SpO2 Readings from Last 3 Encounters:  04/16/23 97%  12/15/22 95%  07/31/22 98%    Physical Exam  Vitals and nursing note reviewed.  Constitutional:      Appearance: Normal appearance.  HENT:     Right Ear: Tympanic membrane, ear canal and external ear normal.     Left Ear: Tympanic membrane, ear canal and external ear normal.     Mouth/Throat:     Mouth: Mucous membranes are moist.     Pharynx: Oropharynx is clear.  Eyes:     Extraocular Movements: Extraocular movements intact.     Pupils: Pupils are equal, round, and reactive to light.  Cardiovascular:     Rate and Rhythm: Normal rate and regular rhythm.     Pulses: Normal pulses.     Heart sounds: Normal heart sounds.  Pulmonary:     Effort: Pulmonary effort is normal.      Breath sounds: Normal breath sounds.  Abdominal:     General: Bowel sounds are normal. There is no distension.     Palpations: There is no mass.     Tenderness: There is no abdominal tenderness.     Hernia: No hernia is present.  Musculoskeletal:     Right lower leg: No edema.     Left lower leg: No edema.  Lymphadenopathy:     Cervical: No cervical adenopathy.  Skin:    General: Skin is warm.  Neurological:     General: No focal deficit present.     Mental Status: She is alert.     Deep Tendon Reflexes:     Reflex Scores:      Bicep reflexes are 2+ on the right side and 2+ on the left side.      Patellar reflexes are 2+ on the right side and 2+ on the left side.    Comments: Bilateral upper and lower extremity strength 5/5  Psychiatric:        Mood and Affect: Mood normal.        Behavior: Behavior normal.        Thought Content: Thought content normal.        Judgment: Judgment normal.      Results for orders placed or performed in visit on 04/16/23  POCT glycosylated hemoglobin (Hb A1C)  Result Value Ref Range   Hemoglobin A1C 6.4 (A) 4.0 - 5.6 %   HbA1c POC (<> result, manual entry)     HbA1c, POC (prediabetic range)     HbA1c, POC (controlled diabetic range)        The ASCVD Risk score (Arnett DK, et al., 2019) failed to calculate for the following reasons:   The valid total cholesterol range is 130 to 320 mg/dL    Assessment & Plan:   Problem List Items Addressed This Visit       Cardiovascular and Mediastinum   HTN (hypertension)    Patient currently maintained on lisinopril 10 mg daily.  Blood pressure under great control.  Tolerates medication well.  Continue medication as prescribed        Digestive   GERD (gastroesophageal reflux disease)     Endocrine   DM (diabetes mellitus) (HCC)    History of the same.  Patient currently maintained on Farxiga 10 mg, glipizide 5 mg, metformin 1000 mg twice daily.  A1c of 6.4% today.  Continue medication as  prescribed      Relevant Orders   POCT glycosylated hemoglobin (Hb A1C) (Completed)   CBC   Comprehensive metabolic panel   Lipid panel   Microalbumin / creatinine urine ratio     Nervous and  Auditory   Neuropathy    Patient currently followed by [facility] neurolog[facility].  She is on nortriptyline and pregabalin.  Continue taking medication as prescribed follow-up with specialist as recommended        Other   Preventative health care - Primary    Discussed age-appropriate immunizations and screening exams.  Did review patient's personal, surgical, social, family histories.  Patient is up-to-date on all age appropriate vaccinations she would like.  Update Prevnar 20 today.  Patient is up-to-date on CRC screening.  No longer participates in cervical cancer screening due to hysterectomy.  Ambulatory order placed for mammogram.  Patient given information to call and set up at her convenience.  Patient was given information at discharge about preventative healthcare maintenance with anticipatory guidance.      Relevant Orders   CBC   Comprehensive metabolic panel   TSH   Obesity (BMI 30-39.9)   Other Visit Diagnoses     Screening mammogram for breast cancer       Relevant Orders   MM 3D SCREENING MAMMOGRAM BILATERAL BREAST   Need for pneumococcal 20-valent conjugate vaccination       Relevant Orders   Pneumococcal conjugate vaccine 20-valent (Completed)   Encounter for immunization           Return in about 6 months (around 10/15/2023) for DM recheck.    Audria Nine, NP

## 2023-04-16 NOTE — Assessment & Plan Note (Signed)
Patient currently followed by [facility] neurology.  She is on nortriptyline and pregabalin.  Continue taking medication as prescribed follow-up with specialist as recommended

## 2023-04-16 NOTE — Assessment & Plan Note (Signed)
Discussed age-appropriate immunizations and screening exams.  Did review patient's personal, surgical, social, family histories.  Patient is up-to-date on all age appropriate vaccinations she would like.  Update Prevnar 20 today.  Patient is up-to-date on CRC screening.  No longer participates in cervical cancer screening due to hysterectomy.  Ambulatory order placed for mammogram.  Patient given information to call and set up at her convenience.  Patient was given information at discharge about preventative healthcare maintenance with anticipatory guidance.

## 2023-04-16 NOTE — Assessment & Plan Note (Signed)
Patient currently maintained on lisinopril 10 mg daily.  Blood pressure under great control.  Tolerates medication well.  Continue medication as prescribed

## 2023-05-20 ENCOUNTER — Other Ambulatory Visit: Payer: Self-pay | Admitting: Nurse Practitioner

## 2023-06-23 ENCOUNTER — Other Ambulatory Visit: Payer: Self-pay | Admitting: Nurse Practitioner

## 2023-06-23 DIAGNOSIS — E1165 Type 2 diabetes mellitus with hyperglycemia: Secondary | ICD-10-CM

## 2023-06-28 ENCOUNTER — Other Ambulatory Visit: Payer: Self-pay | Admitting: Nurse Practitioner

## 2023-06-28 DIAGNOSIS — J392 Other diseases of pharynx: Secondary | ICD-10-CM

## 2023-08-01 ENCOUNTER — Other Ambulatory Visit: Payer: Self-pay | Admitting: Nurse Practitioner

## 2023-08-01 DIAGNOSIS — K219 Gastro-esophageal reflux disease without esophagitis: Secondary | ICD-10-CM

## 2023-08-01 DIAGNOSIS — E1159 Type 2 diabetes mellitus with other circulatory complications: Secondary | ICD-10-CM

## 2023-08-01 DIAGNOSIS — E785 Hyperlipidemia, unspecified: Secondary | ICD-10-CM

## 2023-08-24 ENCOUNTER — Ambulatory Visit
Admission: RE | Admit: 2023-08-24 | Discharge: 2023-08-24 | Disposition: A | Discharge status: Home / Self Care | Source: Ambulatory Visit | Attending: Nurse Practitioner | Admitting: Nurse Practitioner

## 2023-08-24 DIAGNOSIS — Z1231 Encounter for screening mammogram for malignant neoplasm of breast: Secondary | ICD-10-CM | POA: Diagnosis present

## 2023-08-30 ENCOUNTER — Encounter: Payer: Self-pay | Admitting: Nurse Practitioner

## 2023-09-04 ENCOUNTER — Other Ambulatory Visit: Payer: Self-pay | Admitting: Nurse Practitioner

## 2023-10-15 ENCOUNTER — Ambulatory Visit (INDEPENDENT_AMBULATORY_CARE_PROVIDER_SITE_OTHER): Payer: Managed Care, Other (non HMO) | Admitting: Nurse Practitioner

## 2023-10-15 VITALS — BP 120/88 | HR 72 | Temp 97.7°F | Ht 62.0 in | Wt 188.0 lb

## 2023-10-15 DIAGNOSIS — Z7984 Long term (current) use of oral hypoglycemic drugs: Secondary | ICD-10-CM

## 2023-10-15 DIAGNOSIS — E1165 Type 2 diabetes mellitus with hyperglycemia: Secondary | ICD-10-CM

## 2023-10-15 LAB — POCT GLYCOSYLATED HEMOGLOBIN (HGB A1C): Hemoglobin A1C: 8.2 % — AB (ref 4.0–5.6)

## 2023-10-15 MED ORDER — GLIPIZIDE ER 10 MG PO TB24
10.0000 mg | ORAL_TABLET | Freq: Every day | ORAL | 1 refills | Status: AC
Start: 2023-10-15 — End: ?

## 2023-10-15 NOTE — Assessment & Plan Note (Signed)
 Patient currently maintained on metformin  1000 mg twice daily, Farxiga  10 mg daily, glipizide  5 mg daily.  A1c trended up to 8.2%.  Secondary to poor dietary intake.  Patient states she is on a full week until September and does not anticipate dietary intake to improve.  Will increase glipizide  from 5 mg daily to 10 mg daily.

## 2023-10-15 NOTE — Progress Notes (Signed)
 Established Patient Office Visit  Subjective   Patient ID: Sherry Hobbs, female    DOB: 1970-11-07  Age: 53 y.o. MRN: [id number]  Chief Complaint  Patient presents with   Diabetes    HPI  DM2: patient is currenlty on metfromin 1000mg  BID, glipizde 5mg  daily, and farxiga  10mg  She is checking them once a week. She is getting 150s-222 ( non fasting) She is doing more soda throught the day but they are the zero versions. She is eating 3 meals a day and not snacking She is walking 25 mins 3 days a week   States that she is doing a pool league 3 times a week and they do stop and get wendys    Review of Systems  Constitutional:  Negative for chills and fever.  Respiratory:  Negative for shortness of breath.   Cardiovascular:  Negative for chest pain.  Gastrointestinal:        BM 2 a week.  She is taking colace 3 times a week   Neurological:  Negative for dizziness and headaches.      Objective:      BP 120/88   Pulse 72   Temp 97.7 F (36.5 C) (Oral)   Ht 5\' 2"  (1.575 m)   Wt 188 lb (85.3 kg)   LMP 11/26/2017   SpO2 98%   BMI 34.39 kg/m  BP Readings from Last 3 Encounters:  10/15/23 120/88  04/16/23 122/64  12/15/22 118/70   Wt Readings from Last 3 Encounters:  10/15/23 188 lb (85.3 kg)  04/16/23 186 lb (84.4 kg)  12/15/22 186 lb (84.4 kg)   SpO2 Readings from Last 3 Encounters:  10/15/23 98%  04/16/23 97%  12/15/22 95%      Physical Exam Vitals and nursing note reviewed.  Constitutional:      Appearance: Normal appearance.  Cardiovascular:     Rate and Rhythm: Normal rate and regular rhythm.     Heart sounds: Normal heart sounds.  Pulmonary:     Effort: Pulmonary effort is normal.     Breath sounds: Normal breath sounds.  Abdominal:     General: Bowel sounds are normal.  Neurological:     Mental Status: She is alert.      Results for orders placed or performed in visit on 10/15/23  POCT glycosylated hemoglobin (Hb A1C)  Result Value  Ref Range   Hemoglobin A1C 8.2 (A) 4.0 - 5.6 %   HbA1c POC (<> result, manual entry)     HbA1c, POC (prediabetic range)     HbA1c, POC (controlled diabetic range)        The ASCVD Risk score (Arnett DK, et al., 2019) failed to calculate for the following reasons:   The valid total cholesterol range is 130 to 320 mg/dL    Assessment & Plan:   Problem List Items Addressed This Visit       Endocrine   DM (diabetes mellitus) (HCC) - Primary   Patient currently maintained on metformin  1000 mg twice daily, Farxiga  10 mg daily, glipizide  5 mg daily.  A1c trended up to 8.2%.  Secondary to poor dietary intake.  Patient states she is on a full week until September and does not anticipate dietary intake to improve.  Will increase glipizide  from 5 mg daily to 10 mg daily.      Relevant Medications   glipiZIDE  (GLUCOTROL  XL) 10 MG 24 hr tablet   Other Relevant Orders   POCT glycosylated hemoglobin (Hb A1C) (Completed)  Return in about 3 months (around 01/15/2024) for DM recheck.    Margarie Shay, NP

## 2023-10-15 NOTE — Patient Instructions (Addendum)
 Nice to see you today I am going to increase the glipizide  to 10mg  daily  You can taek 2 of the 5mg  tablets at the same time to equal 10mg  at once Follow up with me in 3 months, sooner if you need me  You can use over the counter fiber supplements like benefiber or metamucil to help with the bowels. Also recommend that you drink 64oz of water daily

## 2024-01-16 ENCOUNTER — Encounter: Payer: Self-pay | Admitting: Nurse Practitioner

## 2024-01-16 ENCOUNTER — Ambulatory Visit (INDEPENDENT_AMBULATORY_CARE_PROVIDER_SITE_OTHER): Admitting: Nurse Practitioner

## 2024-01-16 VITALS — BP 118/60 | HR 76 | Temp 98.0°F | Ht 62.0 in | Wt 186.4 lb

## 2024-01-16 DIAGNOSIS — Z6834 Body mass index (BMI) 34.0-34.9, adult: Secondary | ICD-10-CM

## 2024-01-16 DIAGNOSIS — Z7985 Long-term (current) use of injectable non-insulin antidiabetic drugs: Secondary | ICD-10-CM | POA: Diagnosis not present

## 2024-01-16 DIAGNOSIS — F4321 Adjustment disorder with depressed mood: Secondary | ICD-10-CM | POA: Insufficient documentation

## 2024-01-16 DIAGNOSIS — E1165 Type 2 diabetes mellitus with hyperglycemia: Secondary | ICD-10-CM | POA: Diagnosis not present

## 2024-01-16 DIAGNOSIS — E669 Obesity, unspecified: Secondary | ICD-10-CM | POA: Diagnosis not present

## 2024-01-16 LAB — POCT GLYCOSYLATED HEMOGLOBIN (HGB A1C): Hemoglobin A1C: 9.6 % — AB (ref 4.0–5.6)

## 2024-01-16 LAB — BASIC METABOLIC PANEL WITH GFR
BUN: 24 mg/dL — ABNORMAL HIGH (ref 6–23)
CO2: 26 meq/L (ref 19–32)
Calcium: 9.2 mg/dL (ref 8.4–10.5)
Chloride: 100 meq/L (ref 96–112)
Creatinine, Ser: 1.66 mg/dL — ABNORMAL HIGH (ref 0.40–1.20)
GFR: 35.7 mL/min — ABNORMAL LOW (ref >60.00)
Glucose, Bld: 230 mg/dL — ABNORMAL HIGH (ref 70–99)
Potassium: 4.2 meq/L (ref 3.5–5.1)
Sodium: 136 meq/L (ref 135–145)

## 2024-01-16 MED ORDER — OZEMPIC (0.25 OR 0.5 MG/DOSE) 2 MG/3ML ~~LOC~~ SOPN: 0.2500 mg | PEN_INJECTOR | SUBCUTANEOUS | 0 refills | Status: DC

## 2024-01-16 NOTE — Progress Notes (Signed)
 Established Patient Office Visit  Subjective   Patient ID: Sherry Hobbs, female    DOB: 1970/10/27  Age: 53 y.o. MRN: [id number]  Chief Complaint  Patient presents with   Diabetes     Discussed the use of AI scribe software for clinical note transcription with the patient, who gave verbal consent to proceed.  History of Present Illness Sherry Hobbs is a 53 year old female with type 2 diabetes mellitus who presents for follow-up of elevated A1c levels.  She has type 2 diabetes mellitus and is currently on metformin  1000 mg twice daily, Farxiga  10 mg, and glipizide  10mg  XL, which was recently increased from 5 mg to 10 mg. Her A1c has increased from 8.2 to 9.6. She attributes this to changes in her work schedule affecting her diet, increased stress, and recent bereavement following her father's death. She eats out approximately 60% of the time and is trying to reduce soda intake, opting for water or zero-calorie sodas instead. She has not been regularly checking her blood sugars at home, with minimal checks in the past month.  Her father recently passed away following complications from surgery for lung cancer, contributing to her stress levels. She has been eating comfort foods, particularly fried chicken, at her mother's house. She is also responsible for managing her father's estate and caring for her mother, which has added to her stress.  No symptoms of hypoglycemia, fever, chills, chest pain, or shortness of breath. Bowel movements occur every other day, and she takes Colace as needed. She experiences dry mouth, which she manages by drinking water, especially at night. She has been waking up at 3 AM since her father's passing, which she attributes to stress.      Review of Systems  Constitutional:  Negative for chills and fever.  Respiratory:  Negative for shortness of breath.   Cardiovascular:  Negative for chest pain.  Gastrointestinal:  Negative for constipation.   Psychiatric/Behavioral:  The patient has insomnia.       Objective:     BP 118/60   Pulse 76   Temp 98 F (36.7 C) (Oral)   Ht 5' 2 (1.575 m)   Wt 186 lb 6.4 oz (84.6 kg)   LMP 11/26/2017   SpO2 94%   BMI 34.09 kg/m  BP Readings from Last 3 Encounters:  01/16/24 118/60  10/15/23 120/88  04/16/23 122/64   Wt Readings from Last 3 Encounters:  01/16/24 186 lb 6.4 oz (84.6 kg)  10/15/23 188 lb (85.3 kg)  04/16/23 186 lb (84.4 kg)   SpO2 Readings from Last 3 Encounters:  01/16/24 94%  10/15/23 98%  04/16/23 97%      Physical Exam Vitals and nursing note reviewed.  Constitutional:      Appearance: Normal appearance.  Cardiovascular:     Rate and Rhythm: Normal rate and regular rhythm.     Heart sounds: Normal heart sounds.  Pulmonary:     Effort: Pulmonary effort is normal.     Breath sounds: Normal breath sounds.  Abdominal:     General: Bowel sounds are normal.  Neurological:     Mental Status: She is alert.      Results for orders placed or performed in visit on 01/16/24  POCT glycosylated hemoglobin (Hb A1C)  Result Value Ref Range   Hemoglobin A1C 9.6 (A) 4.0 - 5.6 %   HbA1c POC (<> result, manual entry)     HbA1c, POC (prediabetic range)     HbA1c,  POC (controlled diabetic range)        The ASCVD Risk score (Arnett DK, et al., 2019) failed to calculate for the following reasons:   The valid total cholesterol range is 130 to 320 mg/dL    Assessment & Plan:   Problem List Items Addressed This Visit       Endocrine   DM (diabetes mellitus) (HCC) - Primary   Relevant Medications   Semaglutide ,0.25 or 0.5MG /DOS, (OZEMPIC , 0.25 OR 0.5 MG/DOSE,) 2 MG/3ML SOPN   Other Relevant Orders   POCT glycosylated hemoglobin (Hb A1C) (Completed)   Basic metabolic panel with GFR     Other   Obesity (BMI 30-39.9)   Relevant Medications   Semaglutide ,0.25 or 0.5MG /DOS, (OZEMPIC , 0.25 OR 0.5 MG/DOSE,) 2 MG/3ML SOPN   Grief   Assessment and  Plan Assessment & Plan Type 2 diabetes mellitus with hyperglycemia A1c at 9.6 indicates poor glycemic control. Current regimen insufficient. Discussed starting Ozempic  for glycemic control and appetite management. Explained side effects and insurance requirements. No family history of medullary thyroid  cancer or MEN2. - Initiate Ozempic  0.25 mg weekly, increase to 0.5 mg after one month. - Continue Metformin  1000 mg BID, Glipizide  10 mg daily, Farxiga  10 mg daily. - Encourage dietary modifications: protein-heavy meals, portion control of carbohydrates. - Advise water intake of 64 ounces daily. - Encourage regular blood sugar monitoring, especially if unwell. - Provide Ozempic  administration instructions, offer nurse visit for first injection.  Insomnia symptoms Insomnia likely due to stress from bereavement. Discussed melatonin for sleep initiation - Discuss melatonin for sleep initiation if needed. - Advise against sleep aids in the middle of the night.   Return in about 3 months (around 04/16/2024) for DM recheck.    Adina Crandall, NP

## 2024-01-16 NOTE — Patient Instructions (Signed)
 Nice to see you today Your A1C was 9.6% I have added on ozempic  that you will do once a week Follow up with me in 3 months, sooner if you need me

## 2024-01-17 ENCOUNTER — Ambulatory Visit: Payer: Self-pay | Admitting: Nurse Practitioner

## 2024-01-17 DIAGNOSIS — E1122 Type 2 diabetes mellitus with diabetic chronic kidney disease: Secondary | ICD-10-CM

## 2024-01-18 NOTE — Telephone Encounter (Signed)
 See MyChart encounter

## 2024-01-27 ENCOUNTER — Other Ambulatory Visit: Payer: Self-pay | Admitting: Nurse Practitioner

## 2024-01-27 DIAGNOSIS — I152 Hypertension secondary to endocrine disorders: Secondary | ICD-10-CM

## 2024-01-27 DIAGNOSIS — K219 Gastro-esophageal reflux disease without esophagitis: Secondary | ICD-10-CM

## 2024-01-27 DIAGNOSIS — E785 Hyperlipidemia, unspecified: Secondary | ICD-10-CM

## 2024-01-31 ENCOUNTER — Other Ambulatory Visit: Payer: Self-pay | Admitting: Nurse Practitioner

## 2024-01-31 DIAGNOSIS — E1165 Type 2 diabetes mellitus with hyperglycemia: Secondary | ICD-10-CM

## 2024-02-28 ENCOUNTER — Encounter: Payer: Self-pay | Admitting: *Deleted

## 2024-03-07 NOTE — Telephone Encounter (Signed)
 Referral was faxed 02/28/24 to Nephrology.   Once they review, process and rate the referral they will contact the patient to schedule.   They can reach out directly to the Nephrology for appt follow up.   [facility] [address] [place], [place] [address] Phone: [phone]

## 2024-03-19 ENCOUNTER — Other Ambulatory Visit: Payer: Self-pay | Admitting: Nephrology

## 2024-03-19 DIAGNOSIS — D499 Neoplasm of unspecified behavior of unspecified site: Secondary | ICD-10-CM

## 2024-03-19 DIAGNOSIS — K219 Gastro-esophageal reflux disease without esophagitis: Secondary | ICD-10-CM

## 2024-03-19 DIAGNOSIS — R600 Localized edema: Secondary | ICD-10-CM

## 2024-03-19 DIAGNOSIS — N1832 Chronic kidney disease, stage 3b: Secondary | ICD-10-CM

## 2024-03-19 DIAGNOSIS — E663 Overweight: Secondary | ICD-10-CM

## 2024-03-19 DIAGNOSIS — I10 Essential (primary) hypertension: Secondary | ICD-10-CM

## 2024-03-19 DIAGNOSIS — K760 Fatty (change of) liver, not elsewhere classified: Secondary | ICD-10-CM

## 2024-03-19 DIAGNOSIS — M549 Dorsalgia, unspecified: Secondary | ICD-10-CM

## 2024-03-21 ENCOUNTER — Ambulatory Visit
Admission: RE | Admit: 2024-03-21 | Discharge: 2024-03-21 | Disposition: A | Discharge status: Home / Self Care | Source: Ambulatory Visit | Attending: Nephrology | Admitting: Nephrology

## 2024-03-21 DIAGNOSIS — E663 Overweight: Secondary | ICD-10-CM | POA: Diagnosis present

## 2024-03-21 DIAGNOSIS — I10 Essential (primary) hypertension: Secondary | ICD-10-CM | POA: Diagnosis present

## 2024-03-21 DIAGNOSIS — M549 Dorsalgia, unspecified: Secondary | ICD-10-CM | POA: Insufficient documentation

## 2024-03-21 DIAGNOSIS — K219 Gastro-esophageal reflux disease without esophagitis: Secondary | ICD-10-CM | POA: Insufficient documentation

## 2024-03-21 DIAGNOSIS — R600 Localized edema: Secondary | ICD-10-CM | POA: Insufficient documentation

## 2024-03-21 DIAGNOSIS — D499 Neoplasm of unspecified behavior of unspecified site: Secondary | ICD-10-CM | POA: Diagnosis present

## 2024-03-21 DIAGNOSIS — N1832 Chronic kidney disease, stage 3b: Secondary | ICD-10-CM | POA: Insufficient documentation

## 2024-03-21 DIAGNOSIS — G130 Paraneoplastic neuromyopathy and neuropathy: Secondary | ICD-10-CM | POA: Insufficient documentation

## 2024-03-21 DIAGNOSIS — K760 Fatty (change of) liver, not elsewhere classified: Secondary | ICD-10-CM | POA: Diagnosis present

## 2024-03-29 ENCOUNTER — Other Ambulatory Visit: Payer: Self-pay | Admitting: Nurse Practitioner

## 2024-03-30 ENCOUNTER — Other Ambulatory Visit: Payer: Self-pay | Admitting: Nurse Practitioner

## 2024-03-30 DIAGNOSIS — J392 Other diseases of pharynx: Secondary | ICD-10-CM

## 2024-04-16 ENCOUNTER — Ambulatory Visit: Admitting: Nurse Practitioner

## 2024-04-16 VITALS — BP 110/70 | HR 74 | Temp 98.2°F | Ht 62.0 in | Wt 184.6 lb

## 2024-04-16 DIAGNOSIS — Z7985 Long-term (current) use of injectable non-insulin antidiabetic drugs: Secondary | ICD-10-CM | POA: Diagnosis not present

## 2024-04-16 DIAGNOSIS — E1165 Type 2 diabetes mellitus with hyperglycemia: Secondary | ICD-10-CM

## 2024-04-16 DIAGNOSIS — Z7984 Long term (current) use of oral hypoglycemic drugs: Secondary | ICD-10-CM | POA: Diagnosis not present

## 2024-04-16 LAB — POCT GLYCOSYLATED HEMOGLOBIN (HGB A1C): Hemoglobin A1C: 7.2 % — AB (ref 4.0–5.6)

## 2024-04-16 MED ORDER — SEMAGLUTIDE (1 MG/DOSE) 4 MG/3ML ~~LOC~~ SOPN: 1.0000 mg | PEN_INJECTOR | SUBCUTANEOUS | 1 refills | Status: AC

## 2024-04-16 NOTE — Progress Notes (Signed)
 Established Patient Office Visit  Subjective   Patient ID: Sherry Hobbs, female    DOB: [birth year]  Age: 53 y.o. MRN: [id number]  Chief Complaint  Patient presents with   Diabetes    Discussed the use of AI scribe software for clinical note transcription with the patient, who gave verbal consent to proceed.  History of Present Illness Sherry Hobbs is a 53 year old female with type 2 diabetes who presents for follow-up on blood sugar management.  Her blood sugar levels have shown significant improvement, with recent readings of 147, 105, and 94 mg/dL, although she experienced a high of 264 mg/dL. Her A1c has decreased from 9.6% to 7.2%.  She has been on Ozempic  0.5 mg for a couple of months and tolerates it well, with no issues of constipation, as she has regular bowel movements every other day. She notes a decrease in appetite and portion sizes, contributing to some weight loss. She has cut out sodas completely and is eating three meals a day with healthy snacks like apples and dried cranberries.  She engages in physical activity by walking around her block at least three times a week, often accompanied by her new Cane [place] puppy. Her husband is supportive of her lifestyle changes, particularly her switch to drinking water instead of soda.  During a recent episode of hypoglycemia, she felt shaky while playing pool and managed the situation by consuming a Snickers bar. She carries her glucose meter with her to monitor her levels. She glucose was 73  Her family history includes her father's recent passing in July, and her sister has been visiting to help support her mother. She is the only child living nearby, as her brother moved away after a family dispute over financial matters.  She has seen a kidney specialist who advised dietary changes to help manage her kidney health. An ultrasound showed that her kidneys appear normal.     Review of Systems  Constitutional:   Negative for chills and fever.  Respiratory:  Negative for shortness of breath.   Cardiovascular:  Negative for chest pain.  Gastrointestinal:  Negative for abdominal pain.  Neurological:  Negative for dizziness and headaches.      Objective:     BP 110/70   Pulse 74   Temp 98.2 F (36.8 C) (Oral)   Ht 5' 2 (1.575 m)   Wt 184 lb 9.6 oz (83.7 kg)   LMP 11/26/2017   SpO2 97%   BMI 33.76 kg/m  BP Readings from Last 3 Encounters:  04/16/24 110/70  01/16/24 118/60  10/15/23 120/88   Wt Readings from Last 3 Encounters:  04/16/24 184 lb 9.6 oz (83.7 kg)  01/16/24 186 lb 6.4 oz (84.6 kg)  10/15/23 188 lb (85.3 kg)   SpO2 Readings from Last 3 Encounters:  04/16/24 97%  01/16/24 94%  10/15/23 98%      Physical Exam Vitals and nursing note reviewed.  Constitutional:      Appearance: Normal appearance.  Cardiovascular:     Rate and Rhythm: Normal rate and regular rhythm.     Heart sounds: Normal heart sounds.  Pulmonary:     Effort: Pulmonary effort is normal.     Breath sounds: Normal breath sounds.  Abdominal:     General: Bowel sounds are normal.  Neurological:     Mental Status: She is alert.      Results for orders placed or performed in visit on 04/16/24  POCT glycosylated hemoglobin (Hb  A1C)  Result Value Ref Range   Hemoglobin A1C 7.2 (A) 4.0 - 5.6 %   HbA1c POC (<> result, manual entry)     HbA1c, POC (prediabetic range)     HbA1c, POC (controlled diabetic range)        The ASCVD Risk score (Arnett DK, et al., 2019) failed to calculate for the following reasons:   The valid total cholesterol range is 130 to 320 mg/dL    Assessment & Plan:   Problem List Items Addressed This Visit       Endocrine   DM (diabetes mellitus) (HCC) - Primary   Relevant Medications   Semaglutide , 1 MG/DOSE, 4 MG/3ML SOPN   Other Relevant Orders   POCT glycosylated hemoglobin (Hb A1C) (Completed)  Assessment and Plan Assessment & Plan Type 2 diabetes mellitus  with hyperglycemia Blood glucose and A1c levels have improved significantly. Discussed potential medication adjustments if A1c improves further. - Increased Ozempic  to 1 mg weekly. - Continue Metformin  1000 mg BID, Farxiga  10 mg daily, Glipizide  10 mg daily. - Monitor blood glucose levels regularly. - Report any frequent hypoglycemic episodes (blood glucose <85). - Consider reducing oral medications if A1c improves further.  Obesity Weight loss achieved through dietary changes and increased physical activity. Discussed Ozempic 's role in weight management. - Continue current dietary modifications, including reduced soda intake and healthier snack choices. - Encouraged regular physical activity, aiming to increase frequency as possible.  General Health Maintenance Received flu shot.   Return in about 3 months (around 07/15/2024) for DM recheck.    Adina Crandall, NP

## 2024-04-16 NOTE — Patient Instructions (Signed)
 Nice to see you toyda  I am going to increase your ozempic  to 1mg  weekly Continue checking your sugar at home Follow up with me in 3 months, sooner if you need me

## 2024-05-01 ENCOUNTER — Other Ambulatory Visit: Payer: Self-pay | Admitting: Nurse Practitioner

## 2024-05-01 DIAGNOSIS — E1165 Type 2 diabetes mellitus with hyperglycemia: Secondary | ICD-10-CM

## 2024-06-01 ENCOUNTER — Encounter: Payer: Self-pay | Admitting: Nurse Practitioner

## 2024-06-01 DIAGNOSIS — R0683 Snoring: Secondary | ICD-10-CM

## 2024-06-01 DIAGNOSIS — R0681 Apnea, not elsewhere classified: Secondary | ICD-10-CM

## 2024-06-17 ENCOUNTER — Telehealth: Payer: Self-pay | Admitting: Pharmacist

## 2024-06-17 NOTE — Progress Notes (Signed)
 Brief Telephone Documentation Reason for Call: Concern with medication cost due to insurance deductible   Summary of Call: Notes Ozempic  was costly at the pharmacy due to a high deductible insurance plan.   Current Rx insurance: [employer] -- is unsure what her deductible is. Notes this is the same insurance plan that she had last year. By the end of the year, her cost was $25/month on brand medications so she likely did reach the deductible last year.   She reports her pharmacy tried a copay card which did work to reduce the cost down to $300 something (from ~$800) though this was billed without insurance. Had a pre-paid health card from her job/insurance that she used for this refill (28 day supply).   Notes the Ozempic  card on top of her insurance only took of $100 so it was cheaper getting it for the $300.   Considerations: Test claim requested from [facility] team. This may reveal what her drug deductible/copay is for the year.  Saving's cards online only reduce cost by ~$100-150 per month.  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   Follow Up: Patient given direct line for further questions/concerns.  Manuelita FABIENE Kobs, PharmD, BCACP, CPP Clinical Pharmacist Practitioner [facility] Ph: [phone]

## 2024-06-17 NOTE — Addendum Note (Signed)
 Addended by: WENDEE LYNWOOD HERO on: 06/17/2024 01:05 PM   Modules accepted: Orders

## 2024-06-17 NOTE — Telephone Encounter (Signed)
 On ozempic  but has a high deductible. I mentioned to her of switching to Trulicity for PAP. Can you please weigh in

## 2024-06-20 ENCOUNTER — Other Ambulatory Visit (HOSPITAL_COMMUNITY): Payer: Self-pay

## 2024-07-15 ENCOUNTER — Ambulatory Visit: Admitting: Nurse Practitioner
